# Patient Record
Sex: Female | Born: 1955
Health system: Southern US, Community
[De-identification: ages and names within clinical notes are randomized; demographics above are authoritative.]

## PROBLEM LIST (undated history)

## (undated) DIAGNOSIS — R87619 Unspecified abnormal cytological findings in specimens from cervix uteri: Secondary | ICD-10-CM

## (undated) DIAGNOSIS — M1990 Unspecified osteoarthritis, unspecified site: Secondary | ICD-10-CM

## (undated) DIAGNOSIS — I10 Essential (primary) hypertension: Secondary | ICD-10-CM

## (undated) DIAGNOSIS — R9431 Abnormal electrocardiogram [ECG] [EKG]: Secondary | ICD-10-CM

## (undated) DIAGNOSIS — IMO0002 Reserved for concepts with insufficient information to code with codable children: Secondary | ICD-10-CM

## (undated) DIAGNOSIS — F32A Depression, unspecified: Secondary | ICD-10-CM

## (undated) DIAGNOSIS — F419 Anxiety disorder, unspecified: Secondary | ICD-10-CM

## (undated) DIAGNOSIS — E785 Hyperlipidemia, unspecified: Secondary | ICD-10-CM

## (undated) DIAGNOSIS — K5790 Diverticulosis of intestine, part unspecified, without perforation or abscess without bleeding: Secondary | ICD-10-CM

## (undated) DIAGNOSIS — K219 Gastro-esophageal reflux disease without esophagitis: Secondary | ICD-10-CM

## (undated) DIAGNOSIS — F329 Major depressive disorder, single episode, unspecified: Secondary | ICD-10-CM

## (undated) DIAGNOSIS — Z9889 Other specified postprocedural states: Secondary | ICD-10-CM

## (undated) HISTORY — PX: COLONOSCOPY: SHX174

## (undated) HISTORY — DX: Essential (primary) hypertension: I10

## (undated) HISTORY — DX: Hyperlipidemia, unspecified: E78.5

## (undated) HISTORY — DX: Reserved for concepts with insufficient information to code with codable children: IMO0002

## (undated) HISTORY — DX: Unspecified abnormal cytological findings in specimens from cervix uteri: R87.619

## (undated) HISTORY — DX: Other specified postprocedural states: Z98.890

## (undated) HISTORY — DX: Abnormal electrocardiogram (ECG) (EKG): R94.31

## (undated) HISTORY — PX: OTHER SURGICAL HISTORY: SHX169

## (undated) HISTORY — DX: Diverticulosis of intestine, part unspecified, without perforation or abscess without bleeding: K57.90

## (undated) HISTORY — PX: SPINE SURGERY: SHX786

---
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
## 1998-07-10 ENCOUNTER — Ambulatory Visit (HOSPITAL_COMMUNITY): Admission: RE | Admit: 1998-07-10 | Discharge: 1998-07-10 | Payer: Self-pay | Admitting: Gastroenterology

## 2002-03-08 HISTORY — PX: LAMINECTOMY: SHX219

## 2002-03-10 ENCOUNTER — Encounter: Payer: Self-pay | Admitting: Neurosurgery

## 2002-03-12 ENCOUNTER — Ambulatory Visit (HOSPITAL_COMMUNITY): Admission: RE | Admit: 2002-03-12 | Discharge: 2002-03-13 | Payer: Self-pay | Admitting: Neurosurgery

## 2002-03-12 ENCOUNTER — Encounter: Payer: Self-pay | Admitting: Neurosurgery

## 2003-05-19 ENCOUNTER — Other Ambulatory Visit: Admission: RE | Admit: 2003-05-19 | Discharge: 2003-05-19 | Payer: Self-pay | Admitting: Family Medicine

## 2004-06-28 ENCOUNTER — Encounter: Admission: RE | Admit: 2004-06-28 | Discharge: 2004-06-28 | Payer: Self-pay | Admitting: Family Medicine

## 2004-07-09 ENCOUNTER — Encounter: Admission: RE | Admit: 2004-07-09 | Discharge: 2004-07-09 | Payer: Self-pay | Admitting: Family Medicine

## 2005-11-19 ENCOUNTER — Ambulatory Visit: Payer: Self-pay | Admitting: Family Medicine

## 2006-01-20 ENCOUNTER — Ambulatory Visit: Payer: Self-pay | Admitting: Family Medicine

## 2007-01-27 ENCOUNTER — Ambulatory Visit: Payer: Self-pay | Admitting: Family Medicine

## 2007-01-27 ENCOUNTER — Other Ambulatory Visit: Admission: RE | Admit: 2007-01-27 | Discharge: 2007-01-27 | Payer: Self-pay | Admitting: Family Medicine

## 2007-02-24 ENCOUNTER — Ambulatory Visit: Payer: Self-pay | Admitting: Family Medicine

## 2007-03-06 ENCOUNTER — Ambulatory Visit: Payer: Self-pay | Admitting: Family Medicine

## 2007-07-01 ENCOUNTER — Ambulatory Visit: Payer: Self-pay | Admitting: Family Medicine

## 2007-10-19 ENCOUNTER — Ambulatory Visit: Payer: Self-pay | Admitting: Family Medicine

## 2007-11-04 ENCOUNTER — Ambulatory Visit: Payer: Self-pay | Admitting: Cardiology

## 2007-11-04 ENCOUNTER — Encounter: Admission: RE | Admit: 2007-11-04 | Discharge: 2007-11-04 | Payer: Self-pay | Admitting: Family Medicine

## 2007-11-16 ENCOUNTER — Encounter: Payer: Self-pay | Admitting: Cardiology

## 2007-11-16 ENCOUNTER — Ambulatory Visit: Payer: Self-pay

## 2008-03-02 ENCOUNTER — Ambulatory Visit: Payer: Self-pay | Admitting: Family Medicine

## 2008-03-04 ENCOUNTER — Encounter: Admission: RE | Admit: 2008-03-04 | Discharge: 2008-03-04 | Payer: Self-pay | Admitting: Family Medicine

## 2008-11-17 ENCOUNTER — Other Ambulatory Visit: Admission: RE | Admit: 2008-11-17 | Discharge: 2008-11-17 | Payer: Self-pay | Admitting: Family Medicine

## 2008-11-17 ENCOUNTER — Ambulatory Visit: Payer: Self-pay | Admitting: Family Medicine

## 2008-11-17 LAB — HM PAP SMEAR: HM Pap smear: NEGATIVE

## 2009-10-12 LAB — HM MAMMOGRAPHY: HM Mammogram: NEGATIVE

## 2009-11-02 ENCOUNTER — Ambulatory Visit: Payer: Self-pay | Admitting: Family Medicine

## 2010-01-28 ENCOUNTER — Encounter: Payer: Self-pay | Admitting: Family Medicine

## 2010-05-22 NOTE — Assessment & Plan Note (Signed)
[facility]                            CARDIOLOGY OFFICE NOTE   NAME:Amber Church, Amber Church                   MRN:          [id number]  DATE:11/04/2007                            DOB:          [birth year]    I was asked by Dr. Feliberto Gottron and Dr. Sharlot Gowda to consult on  [facility], a pleasant 55 year old married white female with an  abnormal EKG.   Her EKG in Dr. Jola Babinski office showed normal sinus rhythm, but low  voltage and poor progression across the anterior pericardium.  There was  also a left axis deviation of -34 degrees.   She had no cardiac history.  She denies any exertional chest pain.  She  has had some palpitations, mostly nocturnal.  She has had some symptoms  of mild orthostasis.  She drinks very little water.   She is not diabetic.  She does have a history of hypertension and  hyperlipidemia.   PAST MEDICAL HISTORY:  No known drug allergies.   CURRENT MEDICATIONS:  1. Lisinopril and hydrochlorothiazide 10/12.5 daily.  2. Pravastatin 40 mg a day.  3. Aspirin 81 mg a day.   She does not smoke.  She does drink couple of glasses of wine on the  weekends only.  She does drink couple of cups of coffee a day.  She  enjoys walking at lunch time.   SURGERY:  She has had ruptured disk repair in 2007.   FAMILY HISTORY:  Negative for premature coronary artery disease.   SOCIAL HISTORY:  She is married and has 1 child.  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   REVIEW OF SYSTEMS:  Other than the HPI is positive for fatigue and  urinary tract problems or infections.   PHYSICAL EXAMINATION:  VITAL SIGNS:  Today, she is 5 feet 5 inches and  weighs 206.  Her blood pressure is 100/80 and her pulse is 89 and  regular.  EKG was repeated and is identical to that of Dr. Jola Babinski.  HEENT:  Normocephalic, atraumatic.  PERRLA.  Extraocular movements are  intact.  Sclerae are clear.  Facial symmetry is normal.   Dentition is  satisfactory.  Oral mucosa is normal.  NECK:  Supple.  Carotid upstrokes are equal bilaterally without bruits,  no JVD.  Thyroid is not enlarged.  Trachea is midline.  LUNGS:  Clear to auscultation and percussion.  HEART:  A nondisplaced PMI.  Normal S1 and S2.  No click, murmur, rub,  or gallop.  ABDOMEN:  Soft, good bowel sounds.  No midline or pulsatile mass.  No  hepatomegaly.  EXTREMITIES:  There is no cyanosis, clubbing, or edema.  Pulses are  intact.  NEURO:  Intact.  SKIN:  Unremarkable.   ASSESSMENT:  Abnormal electrocardiogram, which is probably normal.   PLAN:  A 2-D echocardiogram to rule out organic heart disease.  If this  is normal reassurance will be given.  Her tachy palpitations in that  instance would also be deemed benign.     Thomas C. Daleen Squibb, MD, [facility]  Electronically Signed  TCW/MedQ  DD: 11/04/2007  DT: 11/05/2007  Job #: [id number]   cc:   Sharlot Gowda, M.D.  Dr. Feliberto Gottron

## 2010-05-25 NOTE — Op Note (Signed)
   [name], RAINE                        ACCOUNT NO.:  [id number]   MEDICAL RECORD NO.:  [id number]                   PATIENT TYPE:  OIB   LOCATION:  NA                                   FACILITY:  [facility]   PHYSICIAN:  Payton Doughty, M.D.                   DATE OF BIRTH:  08/09/1955   DATE OF PROCEDURE:  03/12/2002  DATE OF DISCHARGE:                                 OPERATIVE REPORT   PREOPERATIVE DIAGNOSES:  Herniated disk at L5-S1 left.   POSTOPERATIVE DIAGNOSIS:  Herniated disk at L5-S1 left.   PROCEDURE:  L5-S1 laminectomy and diskectomy.   SURGEON:  Payton Doughty, M.D.   ASSISTANTS:  Nurse assistant:  Basilia Jumbo.  Doctor assistant:  Stefani Dama, M.D.   ANESTHESIA:  General endotracheal.   PREPARATION:  Sterile Betadine prep and scrub with alcohol wipe.   COMPLICATIONS:  None.   DESCRIPTION OF PROCEDURE:  A 55 year old right-handed white lady with a  herniated disk at L5-S1 on the left.  Taken to the operating room and  smoothly anesthetized and intubated, placed prone on the operating table.  Following shave, prep, and drape in the usual sterile fashion, the skin was  infiltrated with 1% lidocaine and 1:400,000.  The skin was incised from mid-  L5 to mid-S1 and the lamina of L5, exposing the left side in the  subperiosteal plane.  Intraoperative x-ray confirmed correctness of level.  Having confirmed correctness of the level, L5 hemisemilaminectomy was  carried out to the top of the ligamentum flavum that was removed in a  retrograde fashion.  The S1 nerve root was identified and gently dissected  free and retracted medially, exposing a herniated disk underneath it.  The  annular fibers were divided and the disk material removed.  The disk itself  was explored and all marginally clinging fragments were removed.  The  anterior epidural space was carefully explored and no fragments were found.  The wound was irrigated and hemostasis assured.  The laminectomy defect  was  filled with Depo-Medrol-soaked fat.  The fascia was reapproximated with 0  Vicryl in interrupted fashion, the subcutaneous tissue was reapproximated  with 0 Vicryl in interrupted fashion.  The skin was closed with 4-0 Vicryl  in a running subcuticular fashion.  Benzoin and Steri-Strips were placed and  made occlusive with Telfa and OpSite and the patient returned to the  recovery room in good condition.                                               Payton Doughty, M.D.    MWR/MEDQ  D:  03/12/2002  T:  03/13/2002  Job:  [id number]

## 2010-05-25 NOTE — H&P (Signed)
[name], [name]                        ACCOUNT NO.:  [id number]   MEDICAL RECORD NO.:  [id number]                   PATIENT TYPE:  OIB   LOCATION:  3010                                 FACILITY:  [facility]   PHYSICIAN:  Payton Doughty, M.D.                   DATE OF BIRTH:  [birth year]   DATE OF ADMISSION:  03/12/2002  DATE OF DISCHARGE:                                HISTORY & PHYSICAL   ADMITTING DIAGNOSIS:  Herniated disk on the left side at L5-S1.   SERVICE:  Neurosurgery.   HISTORY OF PRESENT ILLNESS:  [age]-year-old right-handed white lady who  in December was picking up a couch and had a funny feeling in her back,  since then has had progressive pain in her back and down her left leg.  She  tried oral steroids and did not help her very much.  MRI demonstrates a  herniated disk at 5-1 and she is admitted for diskectomy.   MEDICAL HISTORY:  Medical history is benign.   MEDICATIONS:  She is on hydrocodone and Flexeril.   ALLERGIES:  She has no allergies.   OPERATION:  Has had no operations.   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   FAMILY HISTORY:  Mom is 33, in fair health; she had a disk operation and hip  replacement.  Her daddy is deceased.  There is a history of diabetes.   REVIEW OF SYSTEMS:  Review of systems is remarkable for glasses, arm pain  and the back pain as noted.   PHYSICAL EXAMINATION:  HEENT:  Exam is within normal limits.  NECK:  She has good range of motion in her neck.  CHEST:  Chest clear.  CARDIAC:  Regular rate and rhythm.  ABDOMEN:  Abdomen is nontender with no hepatosplenomegaly.  EXTREMITIES:  Extremities are without clubbing or cyanosis.  GU:  Exam is deferred.  PERIPHERAL PULSES:  Good.  NEUROLOGIC:  She is awake, alert and oriented.  Cranial nerves are intact.  Motor exam shows 5/5 strength throughout the upper and lower extremities.  There is give-away weakness in  the extensors on the left side related to  pain.  Sensory deficit is described in the left S1 distribution.  Deep  tendon reflexes are 2 at the knees, 1 at the right ankle, absent at the left  and straight leg raise is positive on the left.   ACCESSORY CLINICAL DATA:  She comes accompanied with plain films that show  loss of disk space at 5-1 and MRI shows a herniated disk at 5-1, eccentric  to the left with elevation of the left S1 root.   CLINICAL IMPRESSION:  Herniated disk at L5-S1 with left S1 radiculopathy.   PLAN:  Plan is for a lumbar laminectomy and diskectomy.  The risks and  benefits of this approach have been discussed  with her and she wishes to  proceed.                                               Payton Doughty, M.D.    MWR/MEDQ  D:  03/12/2002  T:  03/13/2002  Job:  [id number]

## 2010-07-04 ENCOUNTER — Other Ambulatory Visit: Payer: Self-pay | Admitting: *Deleted

## 2010-07-04 MED ORDER — PRAVASTATIN SODIUM 40 MG PO TABS: 40.0000 mg | ORAL_TABLET | Freq: Every day | ORAL | Status: DC

## 2010-07-06 ENCOUNTER — Encounter: Payer: Self-pay | Admitting: Family Medicine

## 2010-08-06 ENCOUNTER — Ambulatory Visit (INDEPENDENT_AMBULATORY_CARE_PROVIDER_SITE_OTHER): Payer: 59 | Admitting: Family Medicine

## 2010-08-06 ENCOUNTER — Encounter: Payer: Self-pay | Admitting: Family Medicine

## 2010-08-06 ENCOUNTER — Other Ambulatory Visit: Payer: Self-pay

## 2010-08-06 VITALS — BP 132/90 | HR 72 | Ht 65.0 in | Wt 206.0 lb

## 2010-08-06 DIAGNOSIS — E669 Obesity, unspecified: Secondary | ICD-10-CM

## 2010-08-06 DIAGNOSIS — Z0000 Encounter for general adult medical examination without abnormal findings: Secondary | ICD-10-CM

## 2010-08-06 DIAGNOSIS — Z79899 Other long term (current) drug therapy: Secondary | ICD-10-CM

## 2010-08-06 DIAGNOSIS — E785 Hyperlipidemia, unspecified: Secondary | ICD-10-CM | POA: Insufficient documentation

## 2010-08-06 DIAGNOSIS — I10 Essential (primary) hypertension: Secondary | ICD-10-CM | POA: Insufficient documentation

## 2010-08-06 DIAGNOSIS — Z23 Encounter for immunization: Secondary | ICD-10-CM

## 2010-08-06 LAB — LIPID PANEL
Total CHOL/HDL Ratio: 3.3 Ratio
VLDL: 28 mg/dL (ref 0–40)

## 2010-08-06 LAB — COMPREHENSIVE METABOLIC PANEL
ALT: 13 U/L (ref 0–35)
AST: 19 U/L (ref 0–37)
BUN: 13 mg/dL (ref 6–23)
CO2: 26 mEq/L (ref 19–32)
Calcium: 9.4 mg/dL (ref 8.4–10.5)
Creat: 0.86 mg/dL (ref 0.50–1.10)
Total Bilirubin: 0.5 mg/dL (ref 0.3–1.2)
Total Protein: 7 g/dL (ref 6.0–8.3)

## 2010-08-06 LAB — CBC WITH DIFFERENTIAL/PLATELET
Basophils Absolute: 0 10*3/uL (ref 0.0–0.1)
Eosinophils Absolute: 0.1 10*3/uL (ref 0.0–0.7)
Eosinophils Relative: 2 % (ref 0–5)
Lymphocytes Relative: 34 % (ref 12–46)
Lymphs Abs: 2.3 10*3/uL (ref 0.7–4.0)
MCH: 29.1 pg (ref 26.0–34.0)
MCHC: 32.7 g/dL (ref 30.0–36.0)
MCV: 89 fL (ref 78.0–100.0)
Platelets: 283 10*3/uL (ref 150–400)
RBC: 4.29 MIL/uL (ref 3.87–5.11)

## 2010-08-06 MED ORDER — LISINOPRIL-HYDROCHLOROTHIAZIDE 10-12.5 MG PO TABS: 1.0000 | ORAL_TABLET | Freq: Every day | ORAL | Status: DC

## 2010-08-06 MED ORDER — PRAVASTATIN SODIUM 40 MG PO TABS: 40.0000 mg | ORAL_TABLET | Freq: Every day | ORAL | Status: DC

## 2010-08-06 NOTE — Patient Instructions (Signed)
Make dietary changes especially in regard to cholesterol.

## 2010-08-06 NOTE — Progress Notes (Signed)
  Subjective:    Patient ID: Amber Church, female    DOB: 01-14-1955, 55 y.o.   MRN: [id number]  HPI She is here for medication check. She continues on her blood pressure medication and did rule out several days ago. She continues on her statin drug. Work is going quite well and is somewhat stressful but she is handling this well. Review of her record indicates need for TDaP. She is up-to-date on her mammogram as well as colonoscopy. She has no other concerns or complaints.   Review of Systems Negative except as above    Objective:   Physical Exam alert and in no distress. Tympanic membranes and canals are normal. Throat is clear. Tonsils are normal. Neck is supple without adenopathy or thyromegaly. Cardiac exam shows a regular sinus rhythm without murmurs or gallops. Lungs are clear to auscultation.        Assessment & Plan:  Hypertension. Hyperlipidemia. Obesity. Routine blood screening. Encouraged her to make dietary changes especially in regard to carbohydrates. Her meds were renewed.

## 2010-08-06 NOTE — Progress Notes (Signed)
Addended by: Dorthula Perfect on: 08/06/2010 02:41 PM   Modules accepted: Orders

## 2010-08-07 ENCOUNTER — Telehealth: Payer: Self-pay

## 2010-08-07 NOTE — Telephone Encounter (Signed)
Left message blood work normal  

## 2010-11-05 ENCOUNTER — Encounter: Payer: Self-pay | Admitting: Family Medicine

## 2011-03-20 ENCOUNTER — Encounter: Payer: Self-pay | Admitting: Internal Medicine

## 2011-04-08 ENCOUNTER — Ambulatory Visit (INDEPENDENT_AMBULATORY_CARE_PROVIDER_SITE_OTHER): Payer: 59 | Admitting: Family Medicine

## 2011-04-08 ENCOUNTER — Encounter: Payer: Self-pay | Admitting: Family Medicine

## 2011-04-08 VITALS — BP 130/90 | HR 70 | Wt 200.0 lb

## 2011-04-08 DIAGNOSIS — M67919 Unspecified disorder of synovium and tendon, unspecified shoulder: Secondary | ICD-10-CM

## 2011-04-08 DIAGNOSIS — M719 Bursopathy, unspecified: Secondary | ICD-10-CM

## 2011-04-08 DIAGNOSIS — M7552 Bursitis of left shoulder: Secondary | ICD-10-CM

## 2011-04-08 NOTE — Patient Instructions (Signed)
If you're still having trouble in one month, give me a call

## 2011-04-08 NOTE — Progress Notes (Signed)
  Subjective:    Patient ID: Amber Church, female    DOB: 1955/08/04, 57 y.o.   MRN: [id number]  HPI She is here for consult concerning a 2 week history of left shoulder pain. She gives no history of injury but has been doing actual lifting at work but does not remember any particular incident there. There's been no numbness, tingling or crepitus. Her strength is good.   Review of Systems     Objective:   Physical Exam No tenderness to palpation is noted. Negative drop arm test. Negative sulcus test. Pain on motion in all directions. Hawkins and Neer's test was painful.       Assessment & Plan:   1. Bursitis of left shoulder    I discussed options with her concerning conservative care with an inflammatory versus injection. She has decided to have the injection. 40 mg of Kenalog and 3 cc of Xylocaine was injected into the subacromial bursa without difficulty. She is to let me know how this is working in approximately one month

## 2011-07-15 ENCOUNTER — Encounter: Payer: Self-pay | Admitting: Family Medicine

## 2011-07-15 ENCOUNTER — Ambulatory Visit
Admission: RE | Admit: 2011-07-15 | Discharge: 2011-07-15 | Disposition: A | Discharge status: Home / Self Care | Payer: 59 | Source: Ambulatory Visit | Attending: Family Medicine | Admitting: Family Medicine

## 2011-07-15 ENCOUNTER — Ambulatory Visit (INDEPENDENT_AMBULATORY_CARE_PROVIDER_SITE_OTHER): Payer: 59 | Admitting: Family Medicine

## 2011-07-15 VITALS — BP 130/80 | HR 77 | Wt 183.0 lb

## 2011-07-15 DIAGNOSIS — M25519 Pain in unspecified shoulder: Secondary | ICD-10-CM

## 2011-07-15 DIAGNOSIS — M25512 Pain in left shoulder: Secondary | ICD-10-CM

## 2011-07-15 NOTE — Progress Notes (Signed)
  Subjective:    Patient ID: Amber Church, female    DOB: 10/04/1955, 56 y.o.   MRN: [id number]  HPI She is here for a recheck. She states that the shot gave her only 2 weeks worth of relief of her symptoms. Since then the pain is getting worse. She has no history of injury. No clicking or locking.   Review of Systems     Objective:   Physical Exam Abduction and external rotation was quite limited due to pain. Negative sulcus sign but pain on stressing the shoulder labrum. Drop arm test was uncomfortable.       Assessment & Plan:   1. Left shoulder pain  DG Shoulder Left   we will start with x-rays and probably get an MRI.

## 2011-08-20 ENCOUNTER — Other Ambulatory Visit: Payer: Self-pay | Admitting: Family Medicine

## 2011-12-14 ENCOUNTER — Other Ambulatory Visit: Payer: Self-pay | Admitting: Family Medicine

## 2011-12-23 ENCOUNTER — Encounter: Payer: Self-pay | Admitting: Medical

## 2011-12-23 ENCOUNTER — Ambulatory Visit (INDEPENDENT_AMBULATORY_CARE_PROVIDER_SITE_OTHER): Payer: 59 | Admitting: Medical

## 2011-12-23 VITALS — BP 112/80 | HR 68 | Temp 97.9°F | Resp 16 | Wt 184.0 lb

## 2011-12-23 DIAGNOSIS — R232 Flushing: Secondary | ICD-10-CM

## 2011-12-23 DIAGNOSIS — I10 Essential (primary) hypertension: Secondary | ICD-10-CM

## 2011-12-23 DIAGNOSIS — E785 Hyperlipidemia, unspecified: Secondary | ICD-10-CM

## 2011-12-23 DIAGNOSIS — E669 Obesity, unspecified: Secondary | ICD-10-CM

## 2011-12-23 DIAGNOSIS — N951 Menopausal and female climacteric states: Secondary | ICD-10-CM

## 2011-12-23 LAB — CBC WITH DIFFERENTIAL/PLATELET
Basophils Relative: 0 % (ref 0–1)
Eosinophils Relative: 1 % (ref 0–5)
Hemoglobin: 13.2 g/dL (ref 12.0–15.0)
Lymphocytes Relative: 38 % (ref 12–46)
MCH: 29.4 pg (ref 26.0–34.0)
Monocytes Absolute: 0.5 10*3/uL (ref 0.1–1.0)
Monocytes Relative: 6 % (ref 3–12)
Neutrophils Relative %: 55 % (ref 43–77)
Platelets: 346 10*3/uL (ref 150–400)

## 2011-12-23 MED ORDER — CLONIDINE HCL 0.1 MG PO TABS: 0.1000 mg | ORAL_TABLET | Freq: Every day | ORAL | Status: DC

## 2011-12-23 MED ORDER — PRAVASTATIN SODIUM 40 MG PO TABS: 40.0000 mg | ORAL_TABLET | Freq: Every day | ORAL | Status: DC

## 2011-12-23 MED ORDER — LISINOPRIL-HYDROCHLOROTHIAZIDE 10-12.5 MG PO TABS: 1.0000 | ORAL_TABLET | Freq: Every day | ORAL | Status: DC

## 2011-12-23 NOTE — Progress Notes (Signed)
Subjective: Here for med check.   She has hx/o hypertension, hyperlipidemia, here for recheck on medications, labs, fasting.  Overall doing well, no major c/o.  Was exercising regularly, walking with her sister, but not as much given the colder weather, getting dark out sooner.   She had lost 28lb in the spring [employer].   The only concern she has is hot flashes.  LMP 2 years ago.  She does reports some issues with sleep, at times irritable, hot flashes, worse at night, but no vaginal c/o, no mood changes in general.   Less sexual desire mainly due to night time flushing.  This has caused some concern in the marriage.  Would like to have treatment for hot flashes.  No other new c/o.   She did have mammogram and repeat colonoscopy this year.   Last pap 2012.  Past Medical History  Diagnosis Date  . Hypertension   . Diverticulosis   . S/P colonoscopic polypectomy   . Hyperlipidemia    ROS as in HPI   Objective:   Physical Exam  Filed Vitals:   12/23/11 1519  BP: 112/80  Pulse: 68  Temp: 97.9 F (36.6 C)  Resp: 16    General appearance: alert, no distress, WD/WN Neck: supple, no lymphadenopathy, no thyromegaly, no masses Heart: RRR, normal S1, S2, no murmurs Lungs: CTA bilaterally, no wheezes, rhonchi, or rales Abdomen: +bs, soft, non tender, non distended, no masses, no hepatomegaly, no splenomegaly Pulses: 2+ symmetric, upper and lower extremities, normal cap refill Ext: no edema   Assessment and Plan :    Encounter Diagnoses  Name Primary?  . Essential hypertension, benign Yes  . Hyperlipidemia   . Obesity   . Hot flashes   . Menopausal state    HTN - refilled medication, controlled on current medication.  Labs today  Hyperlipidemia - c/t current medication, fasting labs today  Obesity - advised weight loss, routien exercise and healthy low fat diet  Hot flashes, menopausal - discussed symptoms, associated symptoms, options for therapy including HRT.   Also  discussed risks/benefits of therapy.  Trial of clonidine QHS for hot flashes.   discussed diet and exercise.  Follow-up pending labs.  Needs to return soon for updated pap, physical.

## 2011-12-24 ENCOUNTER — Encounter: Payer: Self-pay | Admitting: Internal Medicine

## 2011-12-24 LAB — LIPID PANEL
Cholesterol: 174 mg/dL (ref 0–200)
LDL Cholesterol: 96 mg/dL (ref 0–99)
Total CHOL/HDL Ratio: 2.9 Ratio
VLDL: 18 mg/dL (ref 0–40)

## 2011-12-24 LAB — COMPREHENSIVE METABOLIC PANEL
AST: 18 U/L (ref 0–37)
BUN: 15 mg/dL (ref 6–23)
Calcium: 9.5 mg/dL (ref 8.4–10.5)
Chloride: 103 mEq/L (ref 96–112)
Creat: 0.84 mg/dL (ref 0.50–1.10)
Glucose, Bld: 82 mg/dL (ref 70–99)
Potassium: 4 mEq/L (ref 3.5–5.3)
Total Protein: 6.6 g/dL (ref 6.0–8.3)

## 2011-12-24 LAB — TSH: TSH: 0.956 u[IU]/mL (ref 0.350–4.500)

## 2012-03-09 ENCOUNTER — Ambulatory Visit (INDEPENDENT_AMBULATORY_CARE_PROVIDER_SITE_OTHER): Payer: 59 | Admitting: Family Medicine

## 2012-03-09 ENCOUNTER — Other Ambulatory Visit (HOSPITAL_COMMUNITY)
Admission: RE | Admit: 2012-03-09 | Discharge: 2012-03-09 | Disposition: A | Discharge status: Home / Self Care | Payer: 59 | Source: Ambulatory Visit | Attending: Family Medicine | Admitting: Family Medicine

## 2012-03-09 ENCOUNTER — Encounter: Payer: Self-pay | Admitting: Family Medicine

## 2012-03-09 VITALS — BP 112/86 | HR 60 | Ht 64.5 in | Wt 184.0 lb

## 2012-03-09 DIAGNOSIS — Z01419 Encounter for gynecological examination (general) (routine) without abnormal findings: Secondary | ICD-10-CM | POA: Insufficient documentation

## 2012-03-09 DIAGNOSIS — Z1151 Encounter for screening for human papillomavirus (HPV): Secondary | ICD-10-CM | POA: Insufficient documentation

## 2012-03-09 DIAGNOSIS — N959 Unspecified menopausal and perimenopausal disorder: Secondary | ICD-10-CM | POA: Insufficient documentation

## 2012-03-09 DIAGNOSIS — R32 Unspecified urinary incontinence: Secondary | ICD-10-CM

## 2012-03-09 DIAGNOSIS — E669 Obesity, unspecified: Secondary | ICD-10-CM

## 2012-03-09 DIAGNOSIS — I10 Essential (primary) hypertension: Secondary | ICD-10-CM

## 2012-03-09 DIAGNOSIS — E785 Hyperlipidemia, unspecified: Secondary | ICD-10-CM

## 2012-03-09 DIAGNOSIS — Z0000 Encounter for general adult medical examination without abnormal findings: Secondary | ICD-10-CM

## 2012-03-09 LAB — POCT URINALYSIS DIPSTICK
Bilirubin, UA: NEGATIVE
Ketones, UA: NEGATIVE
Nitrite, UA: NEGATIVE
pH, UA: 5

## 2012-03-09 NOTE — Patient Instructions (Addendum)
HEALTH MAINTENANCE RECOMMENDATIONS:  It is recommended that you get at least 30 minutes of aerobic exercise at least 5 days/week (for weight loss, you may need as much as 60-90 minutes). This can be any activity that gets your heart rate up. This can be divided in 10-15 minute intervals if needed, but try and build up your endurance at least once a week.  Weight bearing exercise is also recommended twice weekly.  Eat a healthy diet with lots of vegetables, fruits and fiber.  "Colorful" foods have a lot of vitamins (ie green vegetables, tomatoes, red peppers, etc).  Limit sweet tea, regular sodas and alcoholic beverages, all of which has a lot of calories and sugar.  Up to 1 alcoholic drink daily may be beneficial for women (unless trying to lose weight, watch sugars).  Drink a lot of water.  Calcium recommendations are 1200-1500 mg daily (1500 mg for postmenopausal women or women without ovaries), and vitamin D 1000 IU daily.  This should be obtained from diet and/or supplements (vitamins), and calcium should not be taken all at once, but in divided doses.  Monthly self breast exams and yearly mammograms for women over the age of 83 is recommended.  Sunscreen of at least SPF 30 should be used on all sun-exposed parts of the skin when outside between the hours of 10 am and 4 pm (not just when at beach or pool, but even with exercise, golf, tennis, and yard work!)  Use a sunscreen that says "broad spectrum" so it covers both UVA and UVB rays, and make sure to reapply every 1-2 hours.  Remember to change the batteries in your smoke detectors when changing your clock times in the spring and fall.  Use your seat belt every time you are in a car, and please drive safely and not be distracted with cell phones and texting while driving.  Check with your insurance regarding zostavax coverage (shingles vaccine) and schedule nurse visit if covered/desired.  Consider trying Estroven (black cohosh and soy) to  further help with hot flashes

## 2012-03-09 NOTE — Progress Notes (Signed)
Chief Complaint  Patient presents with  . Annual Exam    fasting CPE with pap. UA showed trace leuks and 1+ blood, patient does state that she has been experiencing frequency, urgency and some nightime incontinence since menopause. Did not do exam as she is going to sch with her eye doctor and she did not have her glasses. No major concerns.   Amber Church is a 57 y.o. female who presents for a complete physical.  She has the following concerns:  Postmenopausal symptoms--continues to have some irritability.  +hot flashes and night sweats, worse at night.  Noticed some improvement since starting the clonidine, milder, shorter-lived, not as severe.    Hypertension follow-up:  Blood pressures are not checked elsewhere. Denies dizziness, headaches, chest pain.  Denies side effects of medications.  Denies dizziness since adding clonidine for hot flashes.  Hyperlipidemia follow-up:  Patient is reportedly following a low-fat, low cholesterol diet.  Compliant with medications and denies medication side effects  Immunization History  Administered Date(s) Administered  . DTaP 03/13/1998  . Influenza Split 10/22/2011  . Influenza Whole 11/17/2008, 11/03/2010  . Tdap 08/06/2010   Last Pap smear: 2010 Last mammogram: 11/2011 Last colonoscopy: 03/2011 Last DEXA: 11/2008 Dentist: yearly Ophtho: yearly (didn't bring her glasses today) Exercise: walks with her sister 3 miles, about 4x/week.  Past Medical History  Diagnosis Date  . Hypertension   . Diverticulosis   . S/P colonoscopic polypectomy   . Hyperlipidemia   . Abnormal EKG   . Abnormal pap     s/p cryotherapy    Past Surgical History  Procedure Laterality Date  . Laminectomy  03/2002  . Colonoscopy  03/18/11; 2000    Dr. Madilyn Fireman; diverticulosis in sigmoid colon  . Crysurgery  age 71    for abnormal pap    History   Social History  . Marital Status: Married    Spouse Name: N/A    Number of Children: 1  . Years of  Education: N/A   Occupational History  . quality control [employer]   Social History Main Topics  . Smoking status: Never Smoker   . Smokeless tobacco: Never Used  . Alcohol Use: Yes     Comment: glass of wine 1-2 times per week.  . Drug Use: No  . Sexually Active: Yes -- Female partner(s)     Comment: postmenopausal   Other Topics Concern  . Not on file   Social History Narrative   Lives with husband and daughter Nurse, learning disability), 1 dog, 1 cat    Family History  Problem Relation Age of Onset  . Alzheimer's disease Mother   . Hyperlipidemia Mother   . Other Father     died of gunshot wound  . Osteoporosis Sister   . Heart disease Neg Hx   . Osteoporosis Sister   . ALS Maternal Aunt   . ALS Maternal Uncle   . Diabetes Paternal Aunt   . Cancer Maternal Grandfather     lung cancer  . Cancer Paternal Grandmother     ?type    Current outpatient prescriptions:cloNIDine (CATAPRES) 0.1 MG tablet, Take 1 tablet (0.1 mg total) by mouth at bedtime., Disp: 30 tablet, Rfl: 2;  lisinopril-hydrochlorothiazide (PRINZIDE,ZESTORETIC) 10-12.5 MG per tablet, Take 1 tablet by mouth daily., Disp: 30 tablet, Rfl: 5;  pravastatin (PRAVACHOL) 40 MG tablet, Take 1 tablet (40 mg total) by mouth daily., Disp: 30 tablet, Rfl: 5  No Known Allergies  ROS:  The patient denies  anorexia, fever, headaches,  vision changes, decreased hearing, ear pain, sore throat, breast concerns, chest pain, palpitations, dizziness, syncope, dyspnea on exertion, cough, swelling, nausea, vomiting, diarrhea, constipation, abdominal pain, melena, hematochezia, hematuria, dysuria, vaginal bleeding, discharge, odor, sometimes has external vaginal itch; denies genital lesions, joint pains, numbness, tingling, weakness, tremor, suspicious skin lesions, depression, anxiety, abnormal bleeding/bruising, or enlarged lymph nodes. Occasional heartburn at night, improved with eating earlier +weight loss in the Spring.  Has only  gained a few back, since less active with cold/bad weather +urinary incontinence (urge, mainly at night)  PHYSICAL EXAM: BP 112/86  Pulse 60  Ht 5' 4.5" (1.638 m)  Wt 184 lb (83.462 kg)  BMI 31.11 kg/m2  General Appearance:    Alert, cooperative, no distress, appears stated age  Head:    Normocephalic, without obvious abnormality, atraumatic  Eyes:    PERRL, conjunctiva/corneas clear, EOM's intact, fundi    benign  Ears:    Normal TM's and external ear canals  Nose:   Nares normal, mucosa normal, no drainage or sinus   tenderness  Throat:   Lips, mucosa, and tongue normal; teeth and gums normal  Neck:   Supple, no lymphadenopathy;  thyroid:  no   enlargement/tenderness/nodules; no carotid   bruit or JVD  Back:    Spine nontender, no curvature, ROM normal, no CVA     tenderness  Lungs:     Clear to auscultation bilaterally without wheezes, rales or     ronchi; respirations unlabored  Chest Wall:    No tenderness or deformity   Heart:    Regular rate and rhythm, S1 and S2 normal, no murmur, rub   or gallop  Breast Exam:    No tenderness, masses, or nipple discharge or inversion.      No axillary lymphadenopathy  Abdomen:     Soft, non-tender, nondistended, normoactive bowel sounds,    no masses, no hepatosplenomegaly  Genitalia:    Normal external genitalia without lesions.  BUS and vagina normal; cervix without lesions, or cervical motion tenderness. No abnormal vaginal discharge.  Uterus and adnexa not enlarged, nontender, no masses.  Pap performed  Rectal:    Normal tone, no masses or tenderness; guaiac negative stool  Extremities:   No clubbing, cyanosis or edema  Pulses:   2+ and symmetric all extremities  Skin:   Skin color, texture, turgor normal, no rashes or lesions  Lymph nodes:   Cervical, supraclavicular, and axillary nodes normal  Neurologic:   CNII-XII intact, normal strength, sensation and gait; reflexes 2+ and symmetric throughout          Psych:   Normal mood, affect,  hygiene and grooming.    ASSESSMENT/PLAN:  Routine general medical examination at a health care facility - Plan: POCT Urinalysis Dipstick, Cytology - PAP  Obesity (BMI 30-39.9)  Hypertension  Hyperlipidemia LDL goal <100  Postmenopausal symptoms  Urinary incontinence - Plan: Urine culture  Labs all UTD, done at med check in December.  BP and lipids controlled on current meds.  Postmenopausal symptoms--Try Estroven in addition to low dose clonidine.  Discussed increasing dose, but also discussed side effects.  Hold off on increasing dose for now.  Also briefly discussed effexor--recommended especially if increasing irritability/depression.  Discussed monthly self breast exams and yearly mammograms after the age of 66; at least 30 minutes of aerobic activity at least 5 days/week; proper sunscreen use reviewed; healthy diet, including goals of calcium and vitamin D intake and alcohol recommendations (less than or  equal to 1 drink/day) reviewed; regular seatbelt use; changing batteries in smoke detectors.  Immunization recommendations discussed.  Colonoscopy recommendations reviewed, UTD.  Counseled regarding risks/benefits of shingles vaccine--will schedule NV if insurance covers (may need to wait until age 44)

## 2012-03-11 ENCOUNTER — Encounter: Payer: Self-pay | Admitting: Family Medicine

## 2012-03-16 NOTE — Progress Notes (Signed)
Left message with patient's husband asking her to return my call.

## 2012-08-12 ENCOUNTER — Ambulatory Visit (INDEPENDENT_AMBULATORY_CARE_PROVIDER_SITE_OTHER): Payer: 59 | Admitting: Family Medicine

## 2012-08-12 ENCOUNTER — Encounter: Payer: Self-pay | Admitting: Family Medicine

## 2012-08-12 ENCOUNTER — Ambulatory Visit
Admission: RE | Admit: 2012-08-12 | Discharge: 2012-08-12 | Disposition: A | Discharge status: Home / Self Care | Payer: 59 | Source: Ambulatory Visit | Attending: Family Medicine | Admitting: Family Medicine

## 2012-08-12 VITALS — BP 116/80 | HR 75 | Wt 187.0 lb

## 2012-08-12 DIAGNOSIS — M25562 Pain in left knee: Secondary | ICD-10-CM

## 2012-08-12 DIAGNOSIS — M25569 Pain in unspecified knee: Secondary | ICD-10-CM

## 2012-08-12 NOTE — Progress Notes (Signed)
  Subjective:    Patient ID: Amber Church, female    DOB: May 02, 1955, 57 y.o.   MRN: [id number]  HPI She complains of a one-year history of left knee pain that initially bothered her mainly with full flexion however within the last couple months she has noted pain with walking. No popping, locking, grinding or swelling. She now having difficulty with right knee discomfort. Since been going on for the last month. She remembers running a short distance or walking a dog but experienced pain the next morning. It has been slowly getting better. She has been using Advil 2 pills 3 or 4 times per day with benefit.   Review of Systems     Objective:   Physical Exam Exam of the left knee shows no swelling. Anterior drawer negative. No tenderness over joint line. McMurray's testing did cause some medial discomfort but only slightly. Right knee exam shows no swelling, anterior drawer is negative. McMurray's testing negative. No joint line tenderness.       Assessment & Plan:  Left knee pain - Plan: DG Knee 1-2 Views Left

## 2012-08-12 NOTE — Progress Notes (Signed)
Quick Note:  LEFT WORD FOR WORD MEASSGE ON HOME MACHINE(Let her know that she does have mild degenerative changes and can use Tylenol first and then try either Advil or Aleve for relief. If she has continued difficulty have her call for an appointment) ______

## 2012-08-17 ENCOUNTER — Telehealth: Payer: Self-pay | Admitting: Internal Medicine

## 2012-08-17 NOTE — Telephone Encounter (Signed)
Pt states that she has arthritis in knee and her sister does too and her sisters been taking celebrex and so she tried it and she took celebrex for 3 days. One tablet in morning and 2 tynelol in afternoon and she says it has helped a lot. She was wondering if you would call in a rx for celebrex to [facility] in [place]

## 2012-08-17 NOTE — Telephone Encounter (Signed)
Called left word for word message

## 2012-08-17 NOTE — Telephone Encounter (Signed)
Needs to try Advil or Aleve first mainly for insurance purposes

## 2012-08-31 ENCOUNTER — Telehealth: Payer: Self-pay | Admitting: Family Medicine

## 2012-08-31 NOTE — Telephone Encounter (Signed)
Pt called and states Aleve and Tylenol not working for the arthritis.  Can she have Celebre

## 2012-08-31 NOTE — Telephone Encounter (Signed)
Pt called and Aleve and Tylenol not working for Arthritis.  Can she have Celebrex for the AM and use Tylenol in the pm.  Please send to [facility] in [place].

## 2012-08-31 NOTE — Telephone Encounter (Signed)
OK for Celebrex.

## 2012-09-01 ENCOUNTER — Other Ambulatory Visit: Payer: Self-pay | Admitting: Medical

## 2012-09-01 MED ORDER — CELECOXIB 200 MG PO CAPS: 200.0000 mg | ORAL_CAPSULE | Freq: Two times a day (BID) | ORAL | Status: DC

## 2012-09-01 NOTE — Telephone Encounter (Signed)
i was going to fill it but not sure of the mg.

## 2012-09-02 NOTE — Telephone Encounter (Signed)
200 mg dosing

## 2012-09-02 NOTE — Telephone Encounter (Signed)
Pt's daughter notified.

## 2012-09-04 ENCOUNTER — Telehealth: Payer: Self-pay | Admitting: Medical

## 2012-09-09 ENCOUNTER — Telehealth: Payer: Self-pay | Admitting: Medical

## 2012-09-09 NOTE — Telephone Encounter (Signed)
LM

## 2012-11-09 ENCOUNTER — Encounter: Payer: Self-pay | Admitting: Internal Medicine

## 2012-11-10 ENCOUNTER — Telehealth: Payer: Self-pay | Admitting: Medical

## 2012-11-10 NOTE — Telephone Encounter (Signed)
error 

## 2012-12-28 ENCOUNTER — Telehealth: Payer: Self-pay | Admitting: Internal Medicine

## 2012-12-28 MED ORDER — CELECOXIB 200 MG PO CAPS: 200.0000 mg | ORAL_CAPSULE | Freq: Two times a day (BID) | ORAL | Status: DC

## 2012-12-28 NOTE — Telephone Encounter (Signed)
Pt would like refill on celebrex 200mg  for her arthitis for knee send to [facility]

## 2013-01-07 ENCOUNTER — Other Ambulatory Visit: Payer: Self-pay | Admitting: Medical

## 2013-04-01 ENCOUNTER — Other Ambulatory Visit: Payer: Self-pay | Admitting: Medical

## 2013-06-07 ENCOUNTER — Ambulatory Visit (INDEPENDENT_AMBULATORY_CARE_PROVIDER_SITE_OTHER): Payer: 59 | Admitting: Family Medicine

## 2013-06-07 ENCOUNTER — Encounter: Payer: Self-pay | Admitting: Family Medicine

## 2013-06-07 VITALS — BP 110/72 | HR 56 | Ht 64.0 in | Wt 184.0 lb

## 2013-06-07 DIAGNOSIS — M1990 Unspecified osteoarthritis, unspecified site: Secondary | ICD-10-CM

## 2013-06-07 DIAGNOSIS — I10 Essential (primary) hypertension: Secondary | ICD-10-CM

## 2013-06-07 DIAGNOSIS — E785 Hyperlipidemia, unspecified: Secondary | ICD-10-CM

## 2013-06-07 DIAGNOSIS — M129 Arthropathy, unspecified: Secondary | ICD-10-CM

## 2013-06-07 DIAGNOSIS — H6120 Impacted cerumen, unspecified ear: Secondary | ICD-10-CM

## 2013-06-07 DIAGNOSIS — Z0000 Encounter for general adult medical examination without abnormal findings: Secondary | ICD-10-CM

## 2013-06-07 DIAGNOSIS — E669 Obesity, unspecified: Secondary | ICD-10-CM

## 2013-06-07 LAB — CBC WITH DIFFERENTIAL/PLATELET
BASOS ABS: 0 10*3/uL (ref 0.0–0.1)
BASOS PCT: 0 % (ref 0–1)
Eosinophils Absolute: 0.1 10*3/uL (ref 0.0–0.7)
Eosinophils Relative: 1 % (ref 0–5)
HCT: 41 % (ref 36.0–46.0)
Hemoglobin: 14.1 g/dL (ref 12.0–15.0)
LYMPHS PCT: 30 % (ref 12–46)
Lymphs Abs: 2.3 10*3/uL (ref 0.7–4.0)
MCH: 29.6 pg (ref 26.0–34.0)
MCHC: 34.4 g/dL (ref 30.0–36.0)
MCV: 86.1 fL (ref 78.0–100.0)
Monocytes Absolute: 0.5 10*3/uL (ref 0.1–1.0)
Monocytes Relative: 7 % (ref 3–12)
NEUTROS ABS: 4.8 10*3/uL (ref 1.7–7.7)
NEUTROS PCT: 62 % (ref 43–77)
Platelets: 342 10*3/uL (ref 150–400)
RBC: 4.76 MIL/uL (ref 3.87–5.11)
RDW: 13.9 % (ref 11.5–15.5)
WBC: 7.7 10*3/uL (ref 4.0–10.5)

## 2013-06-07 LAB — POCT URINALYSIS DIPSTICK
BILIRUBIN UA: NEGATIVE
Blood, UA: NEGATIVE
Glucose, UA: NEGATIVE
Ketones, UA: NEGATIVE
Leukocytes, UA: NEGATIVE
NITRITE UA: NEGATIVE
PH UA: 7
PROTEIN UA: NEGATIVE
Spec Grav, UA: <=1.005
Urobilinogen, UA: NEGATIVE

## 2013-06-07 LAB — COMPREHENSIVE METABOLIC PANEL
ALBUMIN: 4.3 g/dL (ref 3.5–5.2)
ALK PHOS: 70 U/L (ref 39–117)
ALT: 12 U/L (ref 0–35)
AST: 19 U/L (ref 0–37)
BUN: 12 mg/dL (ref 6–23)
CHLORIDE: 102 meq/L (ref 96–112)
CO2: 29 mEq/L (ref 19–32)
Calcium: 9.7 mg/dL (ref 8.4–10.5)
Creat: 0.8 mg/dL (ref 0.50–1.10)
Glucose, Bld: 86 mg/dL (ref 70–99)
POTASSIUM: 4.4 meq/L (ref 3.5–5.3)
Sodium: 140 mEq/L (ref 135–145)
TOTAL PROTEIN: 7.3 g/dL (ref 6.0–8.3)
Total Bilirubin: 0.6 mg/dL (ref 0.2–1.2)

## 2013-06-07 LAB — LIPID PANEL
Cholesterol: 182 mg/dL (ref 0–200)
HDL: 62 mg/dL (ref >39)
LDL CALC: 100 mg/dL — AB (ref 0–99)
Total CHOL/HDL Ratio: 2.9 Ratio
Triglycerides: 101 mg/dL (ref <150)
VLDL: 20 mg/dL (ref 0–40)

## 2013-06-07 MED ORDER — PRAVASTATIN SODIUM 40 MG PO TABS: ORAL_TABLET | ORAL | Status: DC

## 2013-06-07 MED ORDER — CELECOXIB 200 MG PO CAPS: 200.0000 mg | ORAL_CAPSULE | Freq: Two times a day (BID) | ORAL | Status: DC

## 2013-06-07 MED ORDER — LISINOPRIL-HYDROCHLOROTHIAZIDE 10-12.5 MG PO TABS: ORAL_TABLET | ORAL | Status: DC

## 2013-06-07 NOTE — Progress Notes (Signed)
Subjective:    Patient ID: Amber Church, female    DOB: Mar 03, 1955, 58 y.o.   MRN: [id number]  HPI He is here for a complete examination. She continues on her blood pressure medication and is having no difficulty with this. She also takes Pravachol. Her weight is down slightly. She has been involved with a regular exercise program. She does have bilateral knee pain. OTC meds have not been helpful and she is doing quite well on Celebrex. She was given Catapres to help with her menopausal symptoms but is not taking the stating she is doing quite well. She is up-to-date on her mammogram, Pap and colonoscopy. She has no other concerns or complaints. Social and family history were reviewed. Her work and marriage are going well.  Review of Systems  All other systems reviewed and are negative.      Objective:   Physical Exam BP 110/72  Pulse 56  Ht 5\' 4"  (1.626 m)  Wt 184 lb (83.462 kg)  BMI 31.57 kg/m2  General Appearance:    Alert, cooperative, no distress, appears stated age  Head:    Normocephalic, without obvious abnormality, atraumatic  Eyes:    PERRL, conjunctiva/corneas clear, EOM's intact, fundi    benign  Ears:    Normal TM's and external ear canals after cerumen was removed from both canals. Cerumen was partially mechanically removed   Nose:   Nares normal, mucosa normal, no drainage or sinus   tenderness  Throat:   Lips, mucosa, and tongue normal; teeth and gums normal  Neck:   Supple, no lymphadenopathy;  thyroid:  no   enlargement/tenderness/nodules; no carotid   bruit or JVD  Back:    Spine nontender, no curvature, ROM normal, no CVA     tenderness  Lungs:     Clear to auscultation bilaterally without wheezes, rales or     ronchi; respirations unlabored  Chest Wall:    No tenderness or deformity   Heart:    Regular rate and rhythm, S1 and S2 normal, no murmur, rub   or gallop  Breast Exam:    No tenderness, masses, or nipple discharge or inversion.      No axillary  lymphadenopathy  Abdomen:     Soft, non-tender, nondistended, normoactive bowel sounds,    no masses, no hepatosplenomegaly  Genitalia:    Normal external genitalia without lesions.  BUS and vagina normal; cervix without lesions, or cervical motion tenderness. No abnormal vaginal discharge.  Uterus and adnexa not enlarged, nontender, no masses.  Pap performed  Rectal:    Normal tone, no masses or tenderness; guaiac negative stool  Extremities:   No clubbing, cyanosis or edema  Pulses:   2+ and symmetric all extremities  Skin:   Skin color, texture, turgor normal, no rashes or lesions  Lymph nodes:   Cervical, supraclavicular, and axillary nodes normal  Neurologic:   CNII-XII intact, normal strength, sensation and gait; reflexes 2+ and symmetric throughout          Psych:   Normal mood, affect, hygiene and grooming.         Assessment & Plan:  Routine general medical examination at a health care facility - Plan: Urinalysis Dipstick, CBC with Differential, Comprehensive metabolic panel, Lipid panel  Hypertension - Plan: lisinopril-hydrochlorothiazide (PRINZIDE,ZESTORETIC) 10-12.5 MG per tablet, CBC with Differential, Comprehensive metabolic panel  Hyperlipidemia LDL goal <100 - Plan: pravastatin (PRAVACHOL) 40 MG tablet, Lipid panel  Obesity (BMI 30-39.9) - Plan: CBC with Differential, Comprehensive  metabolic panel, Lipid panel  Arthritis - Plan: celecoxib (CELEBREX) 200 MG capsule  Cerumen impaction

## 2013-06-07 NOTE — Patient Instructions (Signed)
Keep up with a walking and look at making changes in your diet in regard to carbohydrates( white foods). Cut white foods in half

## 2013-11-01 LAB — HM MAMMOGRAPHY

## 2013-11-08 ENCOUNTER — Encounter: Payer: Self-pay | Admitting: Family Medicine

## 2013-11-09 ENCOUNTER — Encounter: Payer: Self-pay | Admitting: Family Medicine

## 2013-11-10 ENCOUNTER — Encounter: Payer: Self-pay | Admitting: Internal Medicine

## 2014-01-31 ENCOUNTER — Telehealth: Payer: Self-pay | Admitting: Family Medicine

## 2014-01-31 NOTE — Telephone Encounter (Signed)
Intiated P.A. Celecoxib.

## 2014-02-01 NOTE — Telephone Encounter (Signed)
P.A. Approved til 01/31/15, pt & pharmacy informed

## 2014-02-24 ENCOUNTER — Encounter: Payer: Self-pay | Admitting: Family Medicine

## 2014-02-24 ENCOUNTER — Ambulatory Visit (INDEPENDENT_AMBULATORY_CARE_PROVIDER_SITE_OTHER): Payer: 59 | Admitting: Family Medicine

## 2014-02-24 VITALS — BP 110/60 | HR 64 | Temp 98.6°F | Ht 64.5 in | Wt 198.0 lb

## 2014-02-24 DIAGNOSIS — N898 Other specified noninflammatory disorders of vagina: Secondary | ICD-10-CM

## 2014-02-24 DIAGNOSIS — D229 Melanocytic nevi, unspecified: Secondary | ICD-10-CM

## 2014-02-24 DIAGNOSIS — Z23 Encounter for immunization: Secondary | ICD-10-CM

## 2014-02-24 DIAGNOSIS — L03319 Cellulitis of trunk, unspecified: Secondary | ICD-10-CM

## 2014-02-24 DIAGNOSIS — L02219 Cutaneous abscess of trunk, unspecified: Secondary | ICD-10-CM

## 2014-02-24 LAB — POCT URINALYSIS DIPSTICK
BILIRUBIN UA: NEGATIVE
GLUCOSE UA: NEGATIVE
KETONES UA: NEGATIVE
LEUKOCYTES UA: NEGATIVE
Nitrite, UA: NEGATIVE
Protein, UA: NEGATIVE
Spec Grav, UA: 1.025
Urobilinogen, UA: NEGATIVE
pH, UA: 6

## 2014-02-24 MED ORDER — DOXYCYCLINE HYCLATE 100 MG PO TABS: 100.0000 mg | ORAL_TABLET | Freq: Two times a day (BID) | ORAL | Status: DC

## 2014-02-24 NOTE — Patient Instructions (Signed)
  Boil in suprapubic area. Apply warm compresses 2-3 times/day Take the antibiotics as directed Use Vagisil (for irritation, not the one for yeast infection), or use a 1/2-1% hydrocortisone cream twice daily, only if needed for itching.  Monitor the mole on your arm--take a picture.  We measured it to be 5 x 77mm in size. If you notice any significant change prior to your next routine appointment, return sooner for re-evaluation and possible biopsy. The appearance to me looks benign.  The rate of increase in size would be a concern (if it continues to grow).  It should remain the same color, shape. Return if bleeding, itching, change in shape, color, size.

## 2014-02-24 NOTE — Progress Notes (Signed)
Chief Complaint  Patient presents with  . knot    on her lower abdominal area-about the size of a quarter and has been there for about a week, not painful but uncomfortable. Also noticed some blood in her urine yesterday, no burning with urination but does mention some external itching for for several months.   . Nevus    has mole on her left arm that has grown and gotten bigger over the last year. If you have time would like you to look at.    She noticed some blood on her underwear yesterday, and when wiping after a void, she noticed it on the toilet paper.  Hasn't noticed any blood today.  Denies dysuria, urgency or any change in urinary frequency.  She is having some itching for several months in the pubic area, but recently spreading over entire vaginal area. Denies any vaginal discharge. Denies new detergents, pantiliners.  Has tried various fabric softeners, but doesn't think they ever bother her. No recent antibiotics.  She has tried Monistat to the affected area, Vagisil.  She may have gotten some temporary relief, but didn't resolve.  Mole on her left upper arm.  It grew in the first 3 months that she first noticed it.  Hasn't changed size since then.  Color has always been very dark.  Not bleeding, itching or painful.  PMH, PSH, SH reviewed and updated Outpatient Encounter Prescriptions as of 02/24/2014  Medication Sig  . celecoxib (CELEBREX) 200 MG capsule Take 1 capsule (200 mg total) by mouth 2 (two) times daily.  Marland Kitchen lisinopril-hydrochlorothiazide (PRINZIDE,ZESTORETIC) 10-12.5 MG per tablet TAKE ONE TABLET BY MOUTH ONCE DAILY  . pravastatin (PRAVACHOL) 40 MG tablet TAKE ONE TABLET BY MOUTH ONCE DAILY  . doxycycline (VIBRA-TABS) 100 MG tablet Take 1 tablet (100 mg total) by mouth 2 (two) times daily.  (doxy started today, not prior to visit)  No Known Allergies  ROS:  No fevers, chills, headaches, dizziness, chest pain, URI symptoms, urinary complaints, vaginal discharge, chest  pain, edema, nausea, vomiting, diarrhea.  See HPI  PHYSICAL EXAM: BP 110/60 mmHg  Pulse 64  Temp(Src) 98.6 F (37 C) (Tympanic)  Ht 5' 4.5" (1.638 m)  Wt 198 lb (89.812 kg)  BMI 33.47 kg/m2 Well developed, pleasant female in good spirits Abdomen: in the lower suprapubic area there is an erythematous raised, tender area with surrounding induration, measuring 1.5-2cm in size.  Very mild surrounding erythema.  No active drainage or fluctuance.  Abdomen is otherwise nontender, no masses The labia appear slightly thickened, but no erythema, no fissures or rash.  Skin is otherwise normal. No abnormal vaginal discharge noted Back: no CVA tenderness Skin: left upper arm: 5x30mm dark, raised and slightly rough feeling oval/round, well-demarcated nevus on the left upper arm.  Urine dip 1+ blood, otherwise normal.  ASSESSMENT/PLAN:  Cellulitis and abscess of trunk - Plan: doxycycline (VIBRA-TABS) 100 MG tablet  Need for prophylactic vaccination and inoculation against influenza - Plan: Flu Vaccine QUAD 36+ mos PF IM (Fluarix Quad PF), POCT Urinalysis Dipstick  Vaginal irritation  Benign pigmented nevus   Boil in suprapubic area. Apply warm compresses 2-3 times/day Take the antibiotics as directed Use Vagisil (for irritation, not the one for yeast infection), or use a 1/2-1% hydrocortisone cream twice daily, only if needed for itching.  Monitor the mole on your arm--take a picture.  We measured it to be 5 x 37mm in size. If you notice any significant change prior to your next routine appointment,  return sooner for re-evaluation and possible biopsy. The appearance to me looks benign.  The rate of increase in size would be a concern (if it continues to grow).  It should remain the same color, shape. Return if bleeding, itching, change in shape, color, size.

## 2014-06-14 ENCOUNTER — Other Ambulatory Visit: Payer: Self-pay | Admitting: Family Medicine

## 2014-06-15 NOTE — Telephone Encounter (Signed)
She needs an appointment. Letter run out of her medicine.

## 2014-06-15 NOTE — Telephone Encounter (Signed)
Is it okay to refill the Celebrex?

## 2014-06-17 ENCOUNTER — Encounter: Payer: Self-pay | Admitting: Family Medicine

## 2014-06-17 ENCOUNTER — Ambulatory Visit (INDEPENDENT_AMBULATORY_CARE_PROVIDER_SITE_OTHER): Payer: 59 | Admitting: Family Medicine

## 2014-06-17 VITALS — BP 120/78 | HR 89 | Wt 198.0 lb

## 2014-06-17 DIAGNOSIS — E785 Hyperlipidemia, unspecified: Secondary | ICD-10-CM | POA: Diagnosis not present

## 2014-06-17 DIAGNOSIS — I10 Essential (primary) hypertension: Secondary | ICD-10-CM

## 2014-06-17 DIAGNOSIS — M1990 Unspecified osteoarthritis, unspecified site: Secondary | ICD-10-CM | POA: Diagnosis not present

## 2014-06-17 DIAGNOSIS — H6123 Impacted cerumen, bilateral: Secondary | ICD-10-CM

## 2014-06-17 DIAGNOSIS — E669 Obesity, unspecified: Secondary | ICD-10-CM | POA: Diagnosis not present

## 2014-06-17 LAB — CBC WITH DIFFERENTIAL/PLATELET
Basophils Absolute: 0 10*3/uL (ref 0.0–0.1)
Basophils Relative: 0 % (ref 0–1)
EOS PCT: 1 % (ref 0–5)
Eosinophils Absolute: 0.1 10*3/uL (ref 0.0–0.7)
HCT: 39.6 % (ref 36.0–46.0)
Hemoglobin: 13.5 g/dL (ref 12.0–15.0)
LYMPHS ABS: 2.8 10*3/uL (ref 0.7–4.0)
LYMPHS PCT: 34 % (ref 12–46)
MCH: 29.3 pg (ref 26.0–34.0)
MCHC: 34.1 g/dL (ref 30.0–36.0)
MCV: 86.1 fL (ref 78.0–100.0)
MPV: 10.1 fL (ref 8.6–12.4)
Monocytes Absolute: 0.6 10*3/uL (ref 0.1–1.0)
Monocytes Relative: 7 % (ref 3–12)
Neutro Abs: 4.8 10*3/uL (ref 1.7–7.7)
Neutrophils Relative %: 58 % (ref 43–77)
PLATELETS: 325 10*3/uL (ref 150–400)
RBC: 4.6 MIL/uL (ref 3.87–5.11)
RDW: 13.7 % (ref 11.5–15.5)
WBC: 8.3 10*3/uL (ref 4.0–10.5)

## 2014-06-17 MED ORDER — CELECOXIB 200 MG PO CAPS: 200.0000 mg | ORAL_CAPSULE | Freq: Two times a day (BID) | ORAL | Status: DC

## 2014-06-17 MED ORDER — PRAVASTATIN SODIUM 40 MG PO TABS: 40.0000 mg | ORAL_TABLET | Freq: Every day | ORAL | Status: DC

## 2014-06-17 MED ORDER — LISINOPRIL-HYDROCHLOROTHIAZIDE 10-12.5 MG PO TABS: 1.0000 | ORAL_TABLET | Freq: Every day | ORAL | Status: DC

## 2014-06-17 NOTE — Patient Instructions (Signed)
150. minutes week of something physical. Cut back on 'white food'

## 2014-06-17 NOTE — Progress Notes (Signed)
   Subjective:    Patient ID: Amber Church, female    DOB: 20-Jul-1955, 59 y.o.   MRN: [id number]  HPI She is here for an interval evaluation. She continues to use Celebrex on an as-needed basis for arthritis mainly having difficulty with knee pain. She has started an exercise regimen which does change with the seasons. She continues on Pravachol and is having no difficulty with that or with Prinzide. She has had some difficulty with cerumen in the past and has not irrigated her ears recently. She's had no chest pain, shortness of breath, GI issuesHer immunizations are up-to-date and health maintenance is also been reviewed. Work and home life are going well. Family and social history were reviewed.   Review of Systems  All other systems reviewed and are negative.      Objective:   Physical Exam Alert and in no distress. Tympanic membranes and canals are normal After cerumen was removed without difficulty.. Pharyngeal area is normal. Neck is supple without adenopathy or thyromegaly. Cardiac exam shows a regular sinus rhythm without murmurs or gallops. Lungs are clear to auscultation.        Assessment & Plan:  Arthritis - Plan: celecoxib (CELEBREX) 200 MG capsule  Obesity (BMI 30-39.9) - Plan: CBC with Differential/Platelet, Comprehensive metabolic panel, Lipid panel  Hyperlipidemia LDL goal <100 - Plan: pravastatin (PRAVACHOL) 40 MG tablet, Lipid panel  Essential hypertension - Plan: lisinopril-hydrochlorothiazide (PRINZIDE,ZESTORETIC) 10-12.5 MG per tablet, CBC with Differential/Platelet, Comprehensive metabolic panel  Cerumen impaction, bilateral I discussed diet and exercise with her in regard to 150 minutes a week of something physical as well as cutting back on carbohydrates. Continue on present medications.

## 2014-06-18 LAB — LIPID PANEL
CHOL/HDL RATIO: 3.4 ratio
Cholesterol: 165 mg/dL (ref 0–200)
HDL: 49 mg/dL (ref >=46)
LDL CALC: 75 mg/dL (ref 0–99)
TRIGLYCERIDES: 205 mg/dL — AB (ref <150)
VLDL: 41 mg/dL — AB (ref 0–40)

## 2014-06-18 LAB — COMPREHENSIVE METABOLIC PANEL
ALT: 11 U/L (ref 0–35)
AST: 18 U/L (ref 0–37)
Albumin: 4.2 g/dL (ref 3.5–5.2)
Alkaline Phosphatase: 76 U/L (ref 39–117)
BUN: 15 mg/dL (ref 6–23)
CO2: 24 mEq/L (ref 19–32)
CREATININE: 0.96 mg/dL (ref 0.50–1.10)
Calcium: 9.7 mg/dL (ref 8.4–10.5)
Chloride: 101 mEq/L (ref 96–112)
Glucose, Bld: 93 mg/dL (ref 70–99)
Potassium: 3.9 mEq/L (ref 3.5–5.3)
SODIUM: 142 meq/L (ref 135–145)
TOTAL PROTEIN: 7 g/dL (ref 6.0–8.3)
Total Bilirubin: 0.5 mg/dL (ref 0.2–1.2)

## 2014-12-09 ENCOUNTER — Encounter: Payer: Self-pay | Admitting: Medical

## 2014-12-09 ENCOUNTER — Ambulatory Visit (INDEPENDENT_AMBULATORY_CARE_PROVIDER_SITE_OTHER): Payer: 59 | Admitting: Medical

## 2014-12-09 VITALS — BP 108/70 | HR 66 | Temp 97.9°F | Wt 204.0 lb

## 2014-12-09 DIAGNOSIS — M1711 Unilateral primary osteoarthritis, right knee: Secondary | ICD-10-CM | POA: Diagnosis not present

## 2014-12-09 MED ORDER — TRAMADOL HCL 50 MG PO TABS: 50.0000 mg | ORAL_TABLET | Freq: Three times a day (TID) | ORAL | Status: DC | PRN

## 2014-12-09 NOTE — Progress Notes (Signed)
Subjective: Chief Complaint  Patient presents with  . knee pain    been on celebrex for over a year. the rt knee is flaring at night time. said that she can not get comfortable to go to sleep. said that laying on her stomach helps some but not much. said she has been taking one extra of the celebrex a day for the last week. let her know we dont recommend canging her dosage without discussing with her provider.    Here for c/o knee pain bilat.  Has known OA of both knees.  Last xray has been 2014.   Been on Celebrex for about a year and working ok, but in the last 1.5 weeks having right knee pain a little worse than usual.  Not so much of a problem during the day but worse at night.   Denies any recent injury, fall, trauma.   She notes that it may have had some swelling.   It does get puffy from time to time.   Denies redness, fever.   Occasionally gets right hip pain, but no recent hip or ankle pain.  Left knee is same as usual.  She mainly wants something to provide relief at bedtime.  No other aggravating or relieving factors. No other complaint.  Past Medical History  Diagnosis Date  . Hypertension   . Diverticulosis   . S/P colonoscopic polypectomy   . Hyperlipidemia   . Abnormal EKG   . Abnormal pap     s/p cryotherapy   ROS as in subjective  Objective: BP 108/70 mmHg  Pulse 66  Temp(Src) 97.9 F (36.6 C) (Oral)  Wt 204 lb (92.534 kg)  Gen: wd, wn, nad Skin: unremarkable, no warmth or erythema.  There are some spider veins patches of left medial leg below knee and right posterolateral knee MSK: bony arthritis changes of patella, mild crepitus noted, no laxity, there is tenderness right medial joint line which has slight puffiness.  Otherwise non tender, normal ROM, no deformity.  Hip and ankle non tender with normal ROM No leg swelling otherwise Leg pulses 1+ Normal strength and sensation of legs    Assessment: Encounter Diagnosis  Name Primary?  . Primary osteoarthritis  of right knee Yes    Plan: No obvious traumatic or precipitating event other than maybe more intensity with exercise/walking of late.  C/t Celebrex.   Can use Ultram QHS the next several days for relief.  discussed possibly using topical therapy or Tylenol OTC along with Celebrex if she continues to have problems.   If not seeing relief with ultram, then call/return as we may want to update xray.

## 2014-12-30 ENCOUNTER — Telehealth: Payer: Self-pay | Admitting: Family Medicine

## 2014-12-30 DIAGNOSIS — E785 Hyperlipidemia, unspecified: Secondary | ICD-10-CM

## 2014-12-30 DIAGNOSIS — M1990 Unspecified osteoarthritis, unspecified site: Secondary | ICD-10-CM

## 2014-12-30 DIAGNOSIS — I10 Essential (primary) hypertension: Secondary | ICD-10-CM

## 2014-12-30 MED ORDER — LISINOPRIL-HYDROCHLOROTHIAZIDE 10-12.5 MG PO TABS: 1.0000 | ORAL_TABLET | Freq: Every day | ORAL | Status: DC

## 2014-12-30 MED ORDER — CELECOXIB 200 MG PO CAPS: 200.0000 mg | ORAL_CAPSULE | Freq: Two times a day (BID) | ORAL | Status: DC

## 2014-12-30 MED ORDER — PRAVASTATIN SODIUM 40 MG PO TABS: 40.0000 mg | ORAL_TABLET | Freq: Every day | ORAL | Status: DC

## 2014-12-30 NOTE — Telephone Encounter (Signed)
Recvd refill 90day refill requests forCelecoxib 200mg , Lisinopril 10-12.5mg , Pravastatin 40mg 

## 2015-01-10 ENCOUNTER — Telehealth: Payer: Self-pay | Admitting: Family Medicine

## 2015-01-10 NOTE — Telephone Encounter (Signed)
Pt called and stated that her mail order pharmacy is changing. Her new mail order pharmacy is [facility]. I have changed on her snap shot. She currently does not need refills.

## 2015-01-30 ENCOUNTER — Telehealth: Payer: Self-pay

## 2015-01-30 DIAGNOSIS — M1990 Unspecified osteoarthritis, unspecified site: Secondary | ICD-10-CM

## 2015-01-30 DIAGNOSIS — E785 Hyperlipidemia, unspecified: Secondary | ICD-10-CM

## 2015-01-30 DIAGNOSIS — I10 Essential (primary) hypertension: Secondary | ICD-10-CM

## 2015-01-30 MED ORDER — PRAVASTATIN SODIUM 40 MG PO TABS: 40.0000 mg | ORAL_TABLET | Freq: Every day | ORAL | Status: DC

## 2015-01-30 MED ORDER — LISINOPRIL-HYDROCHLOROTHIAZIDE 10-12.5 MG PO TABS: 1.0000 | ORAL_TABLET | Freq: Every day | ORAL | Status: DC

## 2015-01-30 MED ORDER — CELECOXIB 200 MG PO CAPS: 200.0000 mg | ORAL_CAPSULE | Freq: Two times a day (BID) | ORAL | Status: DC

## 2015-01-30 NOTE — Telephone Encounter (Signed)
Pt called saying she is switching to [facility] (in chart) and she needs refills on her Celebrex 200mg , Lisinopril-hctz 10-12.5mg , and Pravastatin 40mg . All for 90 day supply

## 2015-06-26 ENCOUNTER — Other Ambulatory Visit: Payer: Self-pay | Admitting: Family Medicine

## 2015-06-26 NOTE — Telephone Encounter (Signed)
Is this okay to refill? 

## 2015-10-19 ENCOUNTER — Encounter: Payer: Self-pay | Admitting: Family Medicine

## 2015-10-19 LAB — HM MAMMOGRAPHY

## 2015-11-06 LAB — HM MAMMOGRAPHY

## 2015-11-10 ENCOUNTER — Encounter: Payer: Self-pay | Admitting: Family Medicine

## 2015-11-10 ENCOUNTER — Ambulatory Visit (INDEPENDENT_AMBULATORY_CARE_PROVIDER_SITE_OTHER): Payer: 59 | Admitting: Family Medicine

## 2015-11-10 VITALS — BP 120/70 | HR 88 | Ht 65.0 in | Wt 200.0 lb

## 2015-11-10 DIAGNOSIS — M7661 Achilles tendinitis, right leg: Secondary | ICD-10-CM

## 2015-11-10 DIAGNOSIS — M7662 Achilles tendinitis, left leg: Secondary | ICD-10-CM

## 2015-11-10 DIAGNOSIS — R1030 Lower abdominal pain, unspecified: Secondary | ICD-10-CM

## 2015-11-10 NOTE — Patient Instructions (Signed)
Do heel cord stretching especially in the morning. Use the treadmill but keep it flat. Cut out cardboard to the size of your heel and put in your shoe. Advil or Aleve as fine. Have more trouble let me know

## 2015-11-10 NOTE — Progress Notes (Signed)
   Subjective:    Patient ID: Amber Church, female    DOB: [birth year], 60 y.o.   MRN: [id number]  HPI She is here for evaluation of bilateral heel pain for the last 3 months. She started an exercise program around the same time doing treadmill as well as cycling. She does set the treadmill on a slight incline. She has a previous history of plantar fasciitis however this pain is near the insertion of the Achilles rather than over the calcaneal spur.   Review of Systems     Objective:   Physical Exam Tender to palpation bilaterally at the insertion of the Achilles tendon. No tenderness over calcaneal spur. Full motion of the ankle. Achilles tendon shows no palpable lesions.       Assessment & Plan:  Achilles tendinitis of both lower extremities  Lower abdominal pain - Plan: POCT Urinalysis Dipstick The lower abdominal pain diagnosis and urinalysis was entered in error. Recommend heel cord stretches, use a treadmill at 0. Also recommend putting a slight heel lift in with cardboard to change the anatomy slightly. If continued difficulty, she will return here for further evaluation and treatment.

## 2015-11-13 NOTE — Progress Notes (Signed)
Order(s) created erroneously. Erroneous order ID: [id number]  Order moved by: Willy Eddy  Order move date/time: 11/13/2015 7:58 AM  Source Patient: [id number]  Source Contact: 11/10/2015  Destination Patient: [id number]  Destination Contact: 03/24/2012

## 2016-01-15 ENCOUNTER — Ambulatory Visit (INDEPENDENT_AMBULATORY_CARE_PROVIDER_SITE_OTHER): Payer: 59 | Admitting: Family Medicine

## 2016-01-15 ENCOUNTER — Encounter: Payer: Self-pay | Admitting: Family Medicine

## 2016-01-15 VITALS — BP 110/70 | HR 75 | Resp 18 | Ht 64.5 in | Wt 203.2 lb

## 2016-01-15 DIAGNOSIS — M7661 Achilles tendinitis, right leg: Secondary | ICD-10-CM | POA: Diagnosis not present

## 2016-01-15 DIAGNOSIS — Z23 Encounter for immunization: Secondary | ICD-10-CM

## 2016-01-15 DIAGNOSIS — E785 Hyperlipidemia, unspecified: Secondary | ICD-10-CM

## 2016-01-15 DIAGNOSIS — I10 Essential (primary) hypertension: Secondary | ICD-10-CM | POA: Diagnosis not present

## 2016-01-15 DIAGNOSIS — Z1159 Encounter for screening for other viral diseases: Secondary | ICD-10-CM

## 2016-01-15 DIAGNOSIS — Z0000 Encounter for general adult medical examination without abnormal findings: Secondary | ICD-10-CM | POA: Diagnosis not present

## 2016-01-15 DIAGNOSIS — M7662 Achilles tendinitis, left leg: Secondary | ICD-10-CM | POA: Diagnosis not present

## 2016-01-15 DIAGNOSIS — E669 Obesity, unspecified: Secondary | ICD-10-CM | POA: Diagnosis not present

## 2016-01-15 DIAGNOSIS — M1990 Unspecified osteoarthritis, unspecified site: Secondary | ICD-10-CM

## 2016-01-15 LAB — CBC WITH DIFFERENTIAL/PLATELET
Basophils Absolute: 0 {cells}/uL (ref 0–200)
Basophils Relative: 0 %
Eosinophils Absolute: 164 {cells}/uL (ref 15–500)
Eosinophils Relative: 2 %
HCT: 40.6 % (ref 35.0–45.0)
Hemoglobin: 13.1 g/dL (ref 11.7–15.5)
Lymphocytes Relative: 35 %
Lymphs Abs: 2870 {cells}/uL (ref 850–3900)
MCH: 28.7 pg (ref 27.0–33.0)
MCHC: 32.3 g/dL (ref 32.0–36.0)
MCV: 89 fL (ref 80.0–100.0)
MPV: 9.8 fL (ref 7.5–12.5)
Monocytes Absolute: 574 {cells}/uL (ref 200–950)
Monocytes Relative: 7 %
Neutro Abs: 4592 {cells}/uL (ref 1500–7800)
Neutrophils Relative %: 56 %
Platelets: 351 10*3/uL (ref 140–400)
RBC: 4.56 MIL/uL (ref 3.80–5.10)
RDW: 13.5 % (ref 11.0–15.0)
WBC: 8.2 10*3/uL (ref 4.0–10.5)

## 2016-01-15 LAB — COMPREHENSIVE METABOLIC PANEL WITH GFR
ALT: 13 U/L (ref 6–29)
AST: 20 U/L (ref 10–35)
Albumin: 4 g/dL (ref 3.6–5.1)
Alkaline Phosphatase: 65 U/L (ref 33–130)
BUN: 12 mg/dL (ref 7–25)
CO2: 26 mmol/L (ref 20–31)
Calcium: 9.3 mg/dL (ref 8.6–10.4)
Chloride: 104 mmol/L (ref 98–110)
Creat: 0.89 mg/dL (ref 0.50–1.05)
Glucose, Bld: 91 mg/dL (ref 65–99)
Potassium: 4.4 mmol/L (ref 3.5–5.3)
Sodium: 139 mmol/L (ref 135–146)
Total Bilirubin: 0.5 mg/dL (ref 0.2–1.2)
Total Protein: 7.3 g/dL (ref 6.1–8.1)

## 2016-01-15 LAB — POCT URINALYSIS DIPSTICK
BILIRUBIN UA: NEGATIVE
GLUCOSE UA: NEGATIVE
KETONES UA: NEGATIVE
Nitrite, UA: NEGATIVE
Protein, UA: NEGATIVE
Spec Grav, UA: >=1.03
Urobilinogen, UA: NEGATIVE
pH, UA: 6

## 2016-01-15 LAB — LIPID PANEL
Cholesterol: 165 mg/dL (ref <200)
HDL: 59 mg/dL (ref >50)
LDL Cholesterol: 85 mg/dL (ref <100)
TRIGLYCERIDES: 105 mg/dL (ref <150)
Total CHOL/HDL Ratio: 2.8 Ratio (ref <5.0)
VLDL: 21 mg/dL (ref <30)

## 2016-01-15 MED ORDER — PRAVASTATIN SODIUM 40 MG PO TABS: 40.0000 mg | ORAL_TABLET | Freq: Every day | ORAL | 3 refills | Status: DC

## 2016-01-15 MED ORDER — CELECOXIB 200 MG PO CAPS: ORAL_CAPSULE | ORAL | 3 refills | Status: DC

## 2016-01-15 MED ORDER — LISINOPRIL-HYDROCHLOROTHIAZIDE 10-12.5 MG PO TABS: 1.0000 | ORAL_TABLET | Freq: Every day | ORAL | 3 refills | Status: DC

## 2016-01-15 NOTE — Progress Notes (Signed)
   Subjective:    Patient ID: Amber Church, female    DOB: 08-Sep-1955, 61 y.o.   MRN: [id number]  HPI She is here for a complete examination. She continues to have bilateral heel pain in spite of using heel lifts. She's been using Celebrex which has helped in the past for her knee pain. This has interfered with her physical activity. She also states it's interfering with her sleep mainly in having difficulty with heel pain. She continues on lisinopril for her pressure as well as pravastatin for hyperlipidemia. She has no other concerns or complaints. Family and social history as well as health maintenance and immunizations were reviewed.   Review of Systems  All other systems reviewed and are negative.      Objective:   Physical Exam BP 110/70   Pulse 75   Resp 18   Ht 5' 4.5" (1.638 m)   Wt 203 lb 3.2 oz (92.2 kg)   SpO2 96%   BMI 34.34 kg/m   General Appearance:    Alert, cooperative, no distress, appears stated age  Head:    Normocephalic, without obvious abnormality, atraumatic  Eyes:    PERRL, conjunctiva/corneas clear, EOM's intact, fundi    benign  Ears:    Normal TM's and external ear canals  Nose:   Nares normal, mucosa normal, no drainage or sinus   tenderness  Throat:   Lips, mucosa, and tongue normal; teeth and gums normal  Neck:   Supple, no lymphadenopathy;  thyroid:  no   enlargement/tenderness/nodules; no carotid   bruit or JVD     Lungs:     Clear to auscultation bilaterally without wheezes, rales or     ronchi; respirations unlabored      Heart:    Regular rate and rhythm, S1 and S2 normal, no murmur, rub   or gallop  Breast Exam:    Deferred to GYN  Abdomen:     Soft, non-tender, nondistended, normoactive bowel sounds,    no masses, no hepatosplenomegaly  Genitalia:    Deferred to GYN     Extremities:   No clubbing, cyanosis or edema. Tender to palpation over the insertion of the Achilles tendon bilaterally.   Pulses:   2+ and symmetric all  extremities  Skin:   Skin color, texture, turgor normal, no rashes or lesions  Lymph nodes:   Cervical, supraclavicular, and axillary nodes normal  Neurologic:   CNII-XII intact, normal strength, sensation and gait; reflexes 2+ and symmetric throughout          Psych:   Normal mood, affect, hygiene and grooming.          Assessment & Plan:  Annual physical exam - Plan: Urinalysis Dipstick, CBC with Differential/Platelet, Comprehensive metabolic panel, Lipid panel  Achilles tendinitis of both lower extremities - Plan: Ambulatory referral to Physical Therapy  Need for prophylactic vaccination and inoculation against influenza - Plan: Flu Vaccine QUAD 36+ mos IM  Need for hepatitis C screening test - Plan: Hepatitis C antibody  Obesity (BMI 30-39.9)  Arthritis - Plan: celecoxib (CELEBREX) 200 MG capsule  Essential hypertension - Plan: lisinopril-hydrochlorothiazide (PRINZIDE,ZESTORETIC) 10-12.5 MG tablet  Hyperlipidemia LDL goal <100 - Plan: pravastatin (PRAVACHOL) 40 MG tablet, Lipid panel . Immunizations were updated. We'll also send her to physical therapy to help with the Achilles tendinitis with iontophoresis and also good knee rehabilitation program.

## 2016-01-15 NOTE — Patient Instructions (Addendum)
You need  to do the heel lifts. Use 2 Celebrex twice per day and also heat for 20 minutes 3 times per dayInsomnia Insomnia is a sleep disorder that makes it difficult to fall asleep or to stay asleep. Insomnia can cause tiredness (fatigue), low energy, difficulty concentrating, mood swings, and poor performance at work or school. There are three different ways to classify insomnia:  Difficulty falling asleep.  Difficulty staying asleep.  Waking up too early in the morning. Any type of insomnia can be long-term (chronic) or short-term (acute). Both are common. Short-term insomnia usually lasts for three months or less. Chronic insomnia occurs at least three times a week for longer than three months. What are the causes? Insomnia may be caused by another condition, situation, or substance, such as:  Anxiety.  Certain medicines.  Gastroesophageal reflux disease (GERD) or other gastrointestinal conditions.  Asthma or other breathing conditions.  Restless legs syndrome, sleep apnea, or other sleep disorders.  Chronic pain.  Menopause. This may include hot flashes.  Stroke.  Abuse of alcohol, tobacco, or illegal drugs.  Depression.  Caffeine.  Neurological disorders, such as Alzheimer disease.  An overactive thyroid (hyperthyroidism). The cause of insomnia may not be known. What increases the risk? Risk factors for insomnia include:  Gender. Women are more commonly affected than men.  Age. Insomnia is more common as you get older.  Stress. This may involve your professional or personal life.  Income. Insomnia is more common in people with lower income.  Lack of exercise.  Irregular work schedule or night shifts.  Traveling between different time zones. What are the signs or symptoms? If you have insomnia, trouble falling asleep or trouble staying asleep is the main symptom. This may lead to other symptoms, such as:  Feeling fatigued.  Feeling nervous about going  to sleep.  Not feeling rested in the morning.  Having trouble concentrating.  Feeling irritable, anxious, or depressed. How is this treated? Treatment for insomnia depends on the cause. If your insomnia is caused by an underlying condition, treatment will focus on addressing the condition. Treatment may also include:  Medicines to help you sleep.  Counseling or therapy.  Lifestyle adjustments. Follow these instructions at home:  Take medicines only as directed by your health care provider.  Keep regular sleeping and waking hours. Avoid naps.  Keep a sleep diary to help you and your health care provider figure out what could be causing your insomnia. Include:  When you sleep.  When you wake up during the night.  How well you sleep.  How rested you feel the next day.  Any side effects of medicines you are taking.  What you eat and drink.  Make your bedroom a comfortable place where it is easy to fall asleep:  Put up shades or special blackout curtains to block light from outside.  Use a white noise machine to block noise.  Keep the temperature cool.  Exercise regularly as directed by your health care provider. Avoid exercising right before bedtime.  Use relaxation techniques to manage stress. Ask your health care provider to suggest some techniques that may work well for you. These may include:  Breathing exercises.  Routines to release muscle tension.  Visualizing peaceful scenes.  Cut back on alcohol, caffeinated beverages, and cigarettes, especially close to bedtime. These can disrupt your sleep.  Do not overeat or eat spicy foods right before bedtime. This can lead to digestive discomfort that can make it hard for you to  sleep.  Limit screen use before bedtime. This includes:  Watching TV.  Using your smartphone, tablet, and computer.  Stick to a routine. This can help you fall asleep faster. Try to do a quiet activity, brush your teeth, and go to bed  at the same time each night.  Get out of bed if you are still awake after 15 minutes of trying to sleep. Keep the lights down, but try reading or doing a quiet activity. When you feel sleepy, go back to bed.  Make sure that you drive carefully. Avoid driving if you feel very sleepy.  Keep all follow-up appointments as directed by your health care provider. This is important. Contact a health care provider if:  You are tired throughout the day or have trouble in your daily routine due to sleepiness.  You continue to have sleep problems or your sleep problems get worse. Get help right away if:  You have serious thoughts about hurting yourself or someone else. This information is not intended to replace advice given to you by your health care provider. Make sure you discuss any questions you have with your health care provider. Document Released: 12/22/1999 Document Revised: 05/26/2015 Document Reviewed: 09/24/2013 Elsevier Interactive Patient Education  2017 [employer].

## 2016-01-16 ENCOUNTER — Encounter: Payer: Self-pay | Admitting: Internal Medicine

## 2016-01-16 LAB — HEPATITIS C ANTIBODY: HCV AB: NEGATIVE

## 2016-01-22 ENCOUNTER — Encounter: Payer: Self-pay | Admitting: Internal Medicine

## 2016-02-15 ENCOUNTER — Ambulatory Visit: Payer: 59 | Attending: Family Medicine | Admitting: Physical Therapy

## 2016-02-15 DIAGNOSIS — M79672 Pain in left foot: Secondary | ICD-10-CM | POA: Diagnosis not present

## 2016-02-15 DIAGNOSIS — R262 Difficulty in walking, not elsewhere classified: Secondary | ICD-10-CM

## 2016-02-15 DIAGNOSIS — M25672 Stiffness of left ankle, not elsewhere classified: Secondary | ICD-10-CM | POA: Diagnosis present

## 2016-02-15 DIAGNOSIS — M25671 Stiffness of right ankle, not elsewhere classified: Secondary | ICD-10-CM | POA: Insufficient documentation

## 2016-02-15 DIAGNOSIS — M79671 Pain in right foot: Secondary | ICD-10-CM | POA: Insufficient documentation

## 2016-02-15 NOTE — Patient Instructions (Addendum)
Plantarflexion: Heel Lift - Lowering (Eccentric) - Double Leg    Rise up on toes, holding support. Slowly lower heels for 3-5 seconds. Bear more weight on affected leg when lowering heels, as tolerated. _10x3__ reps per set, _3__ sets per day, 7__ days per week.   http://ecce.exer.us/17   Copyright  VHI. All rights reserved. Plantarflexion: Heel Lift - Lowering (Eccentric) - Double to Single Leg    Place a quarter under affected big toe joint. Rise up on toes, holding support. Keep weight on quarter. Lift unaffected leg and slowly lower affected heel for 3-5 seconds. _3 x 10__ reps per set, 3___ sets per day, __7_ days per week.      Copyright  VHI. All rights reserved.  Voncille Lo, PT Exercise Expert for the Aging Adult  02/15/16 4:24 PM Phone: [phone] Fax: [phone]

## 2016-02-15 NOTE — Therapy (Signed)
[place], [place], [address] Phone: [phone]   Fax:  [phone]  Physical Therapy Evaluation  Patient Details  Name: Amber Church MRN: [id number] Date of Birth: 07-Apr-1955 Referring Provider: Jill Alexanders MD  Encounter Date: 02/15/2016      PT End of Session - 02/15/16 1730    Visit Number 1   Number of Visits 12   Date for PT Re-Evaluation 03/28/16   Authorization Type UHC   Authorization Time Period 03-28-16   PT Start Time 0345   PT Stop Time 0445   PT Time Calculation (min) 60 min   Activity Tolerance Patient tolerated treatment well   Behavior During Therapy [facility] for tasks assessed/performed      Past Medical History:  Diagnosis Date  . Abnormal EKG   . Abnormal pap    s/p cryotherapy  . Diverticulosis   . Hyperlipidemia   . Hypertension   . S/P colonoscopic polypectomy     Past Surgical History:  Procedure Laterality Date  . COLONOSCOPY  03/18/11; 2000   Dr. Amedeo Plenty; diverticulosis in sigmoid colon  . crysurgery  age 89   for abnormal pap  . LAMINECTOMY  03/2002    There were no vitals filed for this visit.       Subjective Assessment - 02/15/16 1550    Subjective I have had plantar fasciitis before.  Amber Church been having increasing bil achilles tendonitis and worsening in the past 3 months. I joined the [employer] in [place].  I was using the weights and treadmill with an incline on the treadmill   Pertinent History HTN, Obesity, Hypercholesteremia, bil knee OA, Lumbar disc herniation surgery 2006   Limitations Walking;Standing;House hold activities   How long can you sit comfortably? unlimited   How long can you stand comfortably? < 65min   How long can you walk comfortably? <51min   Diagnostic tests none   Patient Stated Goals No pain when walking and working or getting out of my seat   Currently in Pain? Yes   Pain Score 8    Pain Location Leg  bilateral insertional Achilles  Tendonopathy   Pain Orientation Right;Left;Posterior   Pain Descriptors / Indicators Tightness;Aching   Pain Type Chronic pain   Pain Onset More than a month ago   Pain Frequency Constant   Aggravating Factors  getting out of bed, getting up from a sitting and standing position.   Pain Relieving Factors stretching            [facility] PT Assessment - 02/15/16 1611      Assessment   Medical Diagnosis bil achilles tendonitis   Referring Provider Jill Alexanders MD   Onset Date/Surgical Date 11/29/15   Hand Dominance Right   Prior Therapy none     Precautions   Precautions None     Restrictions   Weight Bearing Restrictions No     Balance Screen   Has the patient fallen in the past 6 months No   Has the patient had a decrease in activity level because of a fear of falling?  No   Is the patient reluctant to leave their home because of a fear of falling?  No     Home Social worker Private residence   Living Arrangements Spouse/significant other;Children   Type of [place] One level     Prior Function   Level of Independence  Independent   Vocation Full time employment   Vocation Requirements works as Surveyor, mining on  all day      Cognition   Overall Cognitive Status Within Functional Limits for tasks assessed     Observation/Other Assessments   Focus on Therapeutic Outcomes (FOTO)  FOTO intake 26% limitation 74% predicted 46%      Posture/Postural Control   Posture/Postural Control Postural limitations   Postural Limitations Flexed trunk;Rounded Shoulders;Forward head   Posture Comments pes planus bil with right foot more supination  stiffness dominant     AROM   Right Ankle Dorsiflexion 5   Right Ankle Plantar Flexion 50   Right Ankle Inversion 22   Right Ankle Eversion 19   Left Ankle Dorsiflexion 3   Left Ankle Plantar Flexion 55   Left Ankle Inversion 38   Left Ankle Eversion 24      Strength   Right Hip Flexion 4+/5   Right Hip Extension 4-/5   Right Hip ABduction 4-/5   Left Hip Flexion 4+/5   Left Hip Extension 4-/5   Left Hip ABduction 4-/5   Right Knee Flexion 5/5   Right Knee Extension 5/5   Left Knee Flexion 5/5   Left Knee Extension 5/5   Right Ankle Dorsiflexion 3-/5   Right Ankle Plantar Flexion 4-/5   Left Ankle Dorsiflexion 3-/5   Left Ankle Plantar Flexion 4-/5     Flexibility   Hamstrings bil 60 , bil hip flexor tightness + thomas test     Palpation   Palpation comment tenderness over bil achilles tendon with more intensity on medial portion     Ambulation/Gait   Gait Comments Pt with wide based gait and difficulty walking due to pain , shortened stride length bil                   [facility] Adult PT Treatment/Exercise - 02/15/16 1611      Self-Care   Self-Care Other Self-Care Comments   Other Self-Care Comments  given bil heel lifts for comfort for walking at work      Manual Therapy   Manual Therapy Soft tissue mobilization   Soft tissue mobilization IASTYM bil achilles tendon      Ankle Exercises: Standing   Other Standing Ankle Exercises plantar flexion heel lift with eccentric lowering 3 x 10,  bil plantarflexion with SLS lowering 3 x 10 right and left                 PT Education - 02/15/16 1726    Education provided Yes   Education Details POC, Explanation of findings.  Initial HEP and heel lifts bil for ease of walking, education on achilles tendonopathy   Person(s) Educated Patient   Methods Explanation;Demonstration;Verbal cues;Handout   Comprehension Verbalized understanding;Returned demonstration          PT Short Term Goals - 02/15/16 1736      PT SHORT TERM GOAL #1   Title STG=LTG           PT Long Term Goals - 02/15/16 1736      PT LONG TERM GOAL #1   Title "Pt will be independent with advanced HEP. Target date 03-28-16     PT LONG TERM GOAL #2   Title "Pt will tolerate standing and  walking for 2 hours without increased pain in achilles in order to return to PLOF and work. Target date 03-28-16   Time 6   Period Weeks  Status New     PT LONG TERM GOAL #3   Title "FOTO will improve from  74% limitation  to  46% limitation    indicating improved functional mobility Target date 03-28-16   Time 6   Period Weeks   Status New     PT LONG TERM GOAL #4   Title Pt will be able to rise from sitting/driving with pain level equal or < 3/10 or less Target Date 03-28-16   Time 6   Period Weeks   Status New     PT LONG TERM GOAL #5   Title Pt will be able to return to consistent walking for exercises for 30 minutes 5 x a week target Date 03-28-16   Time 6   Period Weeks   Status New               Plan - 02/15/16 1635    Clinical Impression Statement 61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 Ms. Bien has  impairments including bil heel pain, limited DF ROM, forward flexed posture, Bil hip flexor tightness, and bil hip abductor weakness, and as a result, pt is limited with ADLs and functional activities. Pt would benefit from skilled outpatient PT services for 2 times a week for 6 weeks to progress toward pain-free PLOF. in order to return to exercises    Rehab Potential Good   PT Frequency 2x / week   PT Duration 6 weeks   PT Treatment/Interventions ADLs/Self Care Home Management;Taping;Dry needling;Passive range of motion;Manual techniques;Therapeutic exercise;Neuromuscular re-education;Patient/family education;Stair training;Gait training;Ultrasound;Moist Heat;Cryotherapy;Electrical Stimulation;Iontophoresis 4mg /ml Dexamethasone   PT Next Visit Plan Discuss achilles Tendonopathy timeline.  Progress exercise for Alfredson protocol.  Assess benefit of heel lifts for working and walking and exercising without   PT Home Exercise Plan Eccentric plantarflexion heel lift eccentric lowering  bil and SLS   Consulted and Agree with Plan of Care Patient      Patient will benefit from skilled therapeutic intervention in order to improve the following deficits and impairments:  Difficulty walking, Obesity, Pain, Postural dysfunction, Increased fascial restricitons, Hypomobility, Decreased strength, Decreased mobility, Decreased range of motion, Decreased activity tolerance  Visit Diagnosis: Pain in left foot  Pain in right foot  Stiffness of left ankle, not elsewhere classified  Stiffness of right ankle, not elsewhere classified  Difficulty in walking, not elsewhere classified     Problem List Patient Active Problem List   Diagnosis Date Noted  . Hypertension 08/06/2010  . Hyperlipidemia LDL goal <100 08/06/2010  . Obesity (BMI 30-39.9) 08/06/2010   Voncille Lo, PT Exercise Expert for the Aging Adult  02/15/16 5:46 PM Phone: [phone] Fax: [phone]   By signing I understand that I am ordering/authorizing the use of Iontophoresis using 4 mg/mL of dexamethasone as a component of this plan of care. [place], [place], [address] Phone: [phone]   Fax:  [phone]  Name: Amber Church MRN: [id number] Date of Birth: [birth year]

## 2016-02-21 ENCOUNTER — Ambulatory Visit: Payer: 59 | Admitting: Physical Therapy

## 2016-02-21 DIAGNOSIS — M79671 Pain in right foot: Secondary | ICD-10-CM

## 2016-02-21 DIAGNOSIS — M79672 Pain in left foot: Secondary | ICD-10-CM | POA: Diagnosis not present

## 2016-02-21 DIAGNOSIS — M25671 Stiffness of right ankle, not elsewhere classified: Secondary | ICD-10-CM

## 2016-02-21 DIAGNOSIS — M25672 Stiffness of left ankle, not elsewhere classified: Secondary | ICD-10-CM

## 2016-02-22 ENCOUNTER — Encounter: Payer: Self-pay | Admitting: Physical Therapy

## 2016-02-22 NOTE — Therapy (Signed)
[place], [place], [address] Phone: [phone]   Fax:  [phone]  Physical Therapy Treatment  Patient Details  Name: Amber Church MRN: [id number] Date of Birth: [birth year] Referring Provider: Jill Alexanders MD  Encounter Date: 02/21/2016      PT End of Session - 02/22/16 1718    Visit Number 1   Number of Visits 12   Date for PT Re-Evaluation 03/28/16   Authorization Type UHC   Authorization Time Period 03-28-16   PT Start Time 1545   PT Stop Time 1629   PT Time Calculation (min) 44 min   Activity Tolerance Patient tolerated treatment well   Behavior During Therapy [facility] for tasks assessed/performed      Past Medical History:  Diagnosis Date  . Abnormal EKG   . Abnormal pap    s/p cryotherapy  . Diverticulosis   . Hyperlipidemia   . Hypertension   . S/P colonoscopic polypectomy     Past Surgical History:  Procedure Laterality Date  . COLONOSCOPY  03/18/11; 2000   Dr. Amedeo Plenty; diverticulosis in sigmoid colon  . crysurgery  age 61   for abnormal pap  . LAMINECTOMY  03/2002    There were no vitals filed for this visit.      Subjective Assessment - 02/22/16 1717    Subjective Patient reports the left may be a little worse but the right is a little better. She has been consistent with her exercises.    Pertinent History HTN, Obesity, Hypercholesteremia, bil knee OA, Lumbar disc herniation surgery 2006   Limitations Walking;Standing;House hold activities   How long can you sit comfortably? unlimited   How long can you stand comfortably? < 62min   How long can you walk comfortably? <1min   Diagnostic tests none   Patient Stated Goals No pain when walking and working or getting out of my seat   Currently in Pain? Yes   Pain Score 8    Pain Location Leg   Pain Orientation Right;Left;Posterior   Pain Descriptors / Indicators Aching   Pain Type Chronic pain   Pain Onset More than a  month ago   Pain Frequency Constant   Aggravating Factors  getting out of bed, standing    Pain Relieving Factors stretching    Multiple Pain Sites No                         [facility] Adult PT Treatment/Exercise - 02/22/16 0001      Manual Therapy   Manual therapy comments bilateral anterior drwaer glides to improve dorsi flexion; tirgger ppoint release to gastroc    Soft tissue mobilization IASTYM bil achilles tendon      Ankle Exercises: Seated   Other Seated Ankle Exercises 4 way yellow band 2x10      Ankle Exercises: Standing   Other Standing Ankle Exercises Standing flexion 2x10                   PT Short Term Goals - 02/15/16 1736      PT SHORT TERM GOAL #1   Title STG=LTG           PT Long Term Goals - 02/22/16 1721      PT LONG TERM GOAL #1   Title "Pt will be independent with advanced HEP. Target date 03-28-16   Baseline performing at home    Period Weeks   Status On-going  PT LONG TERM GOAL #2   Title "Pt will tolerate standing and walking for 2 hours without increased pain in achilles in order to return to PLOF and work. Target date 03-28-16   Time 6   Period Weeks   Status On-going     PT LONG TERM GOAL #3   Title "FOTO will improve from  74% limitation  to  46% limitation    indicating improved functional mobility Target date 03-28-16   Time 6   Period Weeks   Status On-going     PT LONG TERM GOAL #4   Title Pt will be able to rise from sitting/driving with pain level equal or < 3/10 or less Target Date 03-28-16   Time 6   Period Weeks   Status On-going     PT LONG TERM GOAL #5   Title Pt will be able to return to consistent walking for exercises for 30 minutes 5 x a week target Date 03-28-16   Time 6   Period Weeks   Status On-going               Plan - 02/22/16 1719    Clinical Impression Statement Patient had some pain with soft tissue workj but overall she flet like her pain was improved after manault  therapy. Patient showed standing strtch for work but advised to go easy with it. Therapy worked on [place]. She was advised to continue with the alferdson protocol at home.    Rehab Potential Good   PT Frequency 2x / week   PT Duration 6 weeks   PT Treatment/Interventions ADLs/Self Care Home Management;Taping;Dry needling;Passive range of motion;Manual techniques;Therapeutic exercise;Neuromuscular re-education;Patient/family education;Stair training;Gait training;Ultrasound;Moist Heat;Cryotherapy;Electrical Stimulation;Iontophoresis 4mg /ml Dexamethasone   PT Next Visit Plan Discuss achilles Tendonopathy timeline.  Progress exercise for Alfredson protocol.  Assess benefit of heel lifts for working and walking and exercising without   PT Home Exercise Plan Eccentric plantarflexion heel lift eccentric lowering bil and SLS   Consulted and Agree with Plan of Care Patient      Patient will benefit from skilled therapeutic intervention in order to improve the following deficits and impairments:  Difficulty walking, Obesity, Pain, Postural dysfunction, Increased fascial restricitons, Hypomobility, Decreased strength, Decreased mobility, Decreased range of motion, Decreased activity tolerance  Visit Diagnosis: Pain in left foot  Pain in right foot  Stiffness of left ankle, not elsewhere classified  Stiffness of right ankle, not elsewhere classified     Problem List Patient Active Problem List   Diagnosis Date Noted  . Hypertension 08/06/2010  . Hyperlipidemia LDL goal <100 08/06/2010  . Obesity (BMI 30-39.9) 08/06/2010    Carney Living PT DPT  02/22/2016, 5:24 PM  [facility] [address] [place], [place], [address] Phone: [phone]   Fax:  [phone]  Name: Amber Church MRN: [id number] Date of Birth: [birth year]

## 2016-02-27 ENCOUNTER — Ambulatory Visit: Payer: 59 | Admitting: Physical Therapy

## 2016-02-27 DIAGNOSIS — M25671 Stiffness of right ankle, not elsewhere classified: Secondary | ICD-10-CM

## 2016-02-27 DIAGNOSIS — R262 Difficulty in walking, not elsewhere classified: Secondary | ICD-10-CM

## 2016-02-27 DIAGNOSIS — M25672 Stiffness of left ankle, not elsewhere classified: Secondary | ICD-10-CM

## 2016-02-27 DIAGNOSIS — M79672 Pain in left foot: Secondary | ICD-10-CM | POA: Diagnosis not present

## 2016-02-27 DIAGNOSIS — M79671 Pain in right foot: Secondary | ICD-10-CM

## 2016-02-28 ENCOUNTER — Encounter: Payer: Self-pay | Admitting: Physical Therapy

## 2016-02-28 NOTE — Therapy (Signed)
[place], [place], [address] Phone: [phone]   Fax:  [phone]  Physical Therapy Treatment  Patient Details  Name: Amber Church MRN: [id number] Date of Birth: 10-18-1955 Referring Provider: Jill Alexanders MD  Encounter Date: 02/27/2016      PT End of Session - 02/28/16 1003    Visit Number 3   Number of Visits 12   Date for PT Re-Evaluation 03/28/16   Authorization Type UHC   Authorization Time Period 03-28-16   PT Start Time 1630   PT Stop Time 1709   PT Time Calculation (min) 39 min   Activity Tolerance Patient tolerated treatment well   Behavior During Therapy [facility] for tasks assessed/performed      Past Medical History:  Diagnosis Date  . Abnormal EKG   . Abnormal pap    s/p cryotherapy  . Diverticulosis   . Hyperlipidemia   . Hypertension   . S/P colonoscopic polypectomy     Past Surgical History:  Procedure Laterality Date  . COLONOSCOPY  03/18/11; 2000   Dr. Amedeo Plenty; diverticulosis in sigmoid colon  . crysurgery  age 84   for abnormal pap  . LAMINECTOMY  03/2002    There were no vitals filed for this visit.      Subjective Assessment - 02/28/16 0957    Subjective Patient reports that over the past few days her pain has improved significantly., She is still having some medial and lateral pain on the left side of her achillies. She dosent have any pain on the right side. She has been working all day.    Pertinent History HTN, Obesity, Hypercholesteremia, bil knee OA, Lumbar disc herniation surgery 2006   Limitations Walking;Standing;House hold activities   How long can you sit comfortably? unlimited   How long can you stand comfortably? < 34min   How long can you walk comfortably? <44min   Diagnostic tests none   Patient Stated Goals No pain when walking and working or getting out of my seat   Currently in Pain? Yes   Pain Score 4    Pain Location Ankle   Pain Orientation  Left   Pain Descriptors / Indicators Aching   Pain Type Chronic pain   Pain Onset More than a month ago   Pain Frequency Constant   Aggravating Factors  getting in and out of bed    Pain Relieving Factors stretching    Multiple Pain Sites No                         [facility] Adult PT Treatment/Exercise - 02/28/16 0001      Manual Therapy   Manual therapy comments bilateral anterior drwaer glides to improve dorsi flexion; tirgger ppoint release to gastroc    Soft tissue mobilization IASTYM bil achilles tendon      Ankle Exercises: Seated   Other Seated Ankle Exercises 4 way red band 2x10                 PT Education - 02/28/16 1003    Education provided Yes   Education Details continue with current HEP    Person(s) Educated Patient   Methods Explanation;Demonstration;Verbal cues   Comprehension Verbalized understanding;Returned demonstration          PT Short Term Goals - 02/15/16 1736      PT SHORT TERM GOAL #1   Title STG=LTG  PT Long Term Goals - 02/22/16 1721      PT LONG TERM GOAL #1   Title "Pt will be independent with advanced HEP. Target date 03-28-16   Baseline performing at home    Period Weeks   Status On-going     PT LONG TERM GOAL #2   Title "Pt will tolerate standing and walking for 2 hours without increased pain in achilles in order to return to PLOF and work. Target date 03-28-16   Time 6   Period Weeks   Status On-going     PT LONG TERM GOAL #3   Title "FOTO will improve from  74% limitation  to  46% limitation    indicating improved functional mobility Target date 03-28-16   Time 6   Period Weeks   Status On-going     PT LONG TERM GOAL #4   Title Pt will be able to rise from sitting/driving with pain level equal or < 3/10 or less Target Date 03-28-16   Time 6   Period Weeks   Status On-going     PT LONG TERM GOAL #5   Title Pt will be able to return to consistent walking for exercises for 30 minutes 5 x a  week target Date 03-28-16   Time 6   Period Weeks   Status On-going               Plan - 02/28/16 1004    Clinical Impression Statement Patient is making good progres. Therapy advanced her band. Therapy encouraged her to continue with the Alferdson Protocol. She tolerated treatment well. Therapy continues to focus on manual therapy and strengthening.    Rehab Potential Good   PT Frequency 2x / week   PT Duration 6 weeks   PT Treatment/Interventions ADLs/Self Care Home Management;Taping;Dry needling;Passive range of motion;Manual techniques;Therapeutic exercise;Neuromuscular re-education;Patient/family education;Stair training;Gait training;Ultrasound;Moist Heat;Cryotherapy;Electrical Stimulation;Iontophoresis 4mg /ml Dexamethasone   PT Next Visit Plan Discuss achilles Tendonopathy timeline.  Progress exercise for Alfredson protocol.  Assess benefit of heel lifts for working and walking and exercising without. COntinue with HEP    PT Home Exercise Plan Eccentric plantarflexion heel lift eccentric lowering bil and SLS   Consulted and Agree with Plan of Care Patient      Patient will benefit from skilled therapeutic intervention in order to improve the following deficits and impairments:  Difficulty walking, Obesity, Pain, Postural dysfunction, Increased fascial restricitons, Hypomobility, Decreased strength, Decreased mobility, Decreased range of motion, Decreased activity tolerance  Visit Diagnosis: Pain in left foot  Pain in right foot  Stiffness of left ankle, not elsewhere classified  Stiffness of right ankle, not elsewhere classified  Difficulty in walking, not elsewhere classified     Problem List Patient Active Problem List   Diagnosis Date Noted  . Hypertension 08/06/2010  . Hyperlipidemia LDL goal <100 08/06/2010  . Obesity (BMI 30-39.9) 08/06/2010    Carney Living PT DPT  02/28/2016, 10:07 AM  [facility] [address] [place], [place], [address] Phone: [phone]   Fax:  [phone]  Name: [place] MRN: [id number] Date of Birth: 09-Apr-1955

## 2016-02-29 ENCOUNTER — Ambulatory Visit: Payer: 59 | Admitting: Physical Therapy

## 2016-03-05 ENCOUNTER — Encounter: Payer: 59 | Admitting: Physical Therapy

## 2016-03-07 ENCOUNTER — Ambulatory Visit: Payer: 59 | Admitting: Physical Therapy

## 2016-03-12 ENCOUNTER — Encounter: Payer: 59 | Admitting: Physical Therapy

## 2016-03-14 ENCOUNTER — Ambulatory Visit: Payer: 59 | Attending: Family Medicine | Admitting: Physical Therapy

## 2016-03-14 DIAGNOSIS — R262 Difficulty in walking, not elsewhere classified: Secondary | ICD-10-CM | POA: Diagnosis present

## 2016-03-14 DIAGNOSIS — M25671 Stiffness of right ankle, not elsewhere classified: Secondary | ICD-10-CM

## 2016-03-14 DIAGNOSIS — M79671 Pain in right foot: Secondary | ICD-10-CM | POA: Insufficient documentation

## 2016-03-14 DIAGNOSIS — M25672 Stiffness of left ankle, not elsewhere classified: Secondary | ICD-10-CM | POA: Diagnosis present

## 2016-03-14 DIAGNOSIS — M79672 Pain in left foot: Secondary | ICD-10-CM | POA: Diagnosis present

## 2016-03-14 NOTE — Therapy (Signed)
[place], [place], [address] Phone: [phone]   Fax:  [phone]  Physical Therapy Treatment  Patient Details  Name: Amber Church MRN: [id number] Date of Birth: December 06, 1955 Referring Provider: Jill Alexanders MD  Encounter Date: 03/14/2016      PT End of Session - 03/14/16 1504    Visit Number 4   Number of Visits 12   Date for PT Re-Evaluation 03/28/16   Authorization Type UHC   Authorization Time Period 03-28-16   PT Start Time 0300   PT Stop Time 0344   PT Time Calculation (min) 44 min   Activity Tolerance Patient tolerated treatment well   Behavior During Therapy [facility] for tasks assessed/performed      Past Medical History:  Diagnosis Date  . Abnormal EKG   . Abnormal pap    s/p cryotherapy  . Diverticulosis   . Hyperlipidemia   . Hypertension   . S/P colonoscopic polypectomy     Past Surgical History:  Procedure Laterality Date  . COLONOSCOPY  03/18/11; 2000   Dr. Amedeo Plenty; diverticulosis in sigmoid colon  . crysurgery  age 61   for abnormal pap  . LAMINECTOMY  03/2002    There were no vitals filed for this visit.      Subjective Assessment - 03/14/16 1505    Subjective Pt reports that right leg is better than the left.  She feels like she is making improvements   Pertinent History HTN, Obesity, Hypercholesteremia, bil knee OA, Lumbar disc herniation surgery 2006   Limitations Walking;Standing;House hold activities   How long can you sit comfortably? unlimited   Currently in Pain? Yes   Pain Score 4    Pain Location Ankle   Pain Orientation Left   Pain Descriptors / Indicators Aching   Pain Type Chronic pain   Multiple Pain Sites Yes   Pain Score 2   Pain Location Ankle   Pain Orientation Right   Pain Descriptors / Indicators Aching   Pain Type Chronic pain                         [facility] Adult PT Treatment/Exercise - 03/14/16 1507      Manual Therapy    Manual therapy comments bilateral anterior drwaer glides to improve dorsi flexion; tirgger ppoint release to gastroc    Soft tissue mobilization IASTYM bil achilles tendon      Ankle Exercises: Seated   Other Seated Ankle Exercises 4 way red band 2x10      Ankle Exercises: Standing   Other Standing Ankle Exercises Alfredson protocol bil standing heel raise on steps. 3 x 15, then, single limb on left 3 x 10,  on right 2 x 10  patient with decreased endurance and pain                PT Education - 03/14/16 1714    Education provided Yes   Education Details Pt educated and return demo on Neurosurgeon) Educated Patient   Methods Explanation;Demonstration;Handout;Verbal cues;Tactile cues   Comprehension Verbalized understanding;Returned demonstration          PT Short Term Goals - 02/15/16 1736      PT SHORT TERM GOAL #1   Title STG=LTG           PT Long Term Goals - 02/22/16 1721      PT LONG TERM GOAL #1   Title "Pt  will be independent with advanced HEP. Target date 03-28-16   Baseline performing at home    Period Weeks   Status On-going     PT LONG TERM GOAL #2   Title "Pt will tolerate standing and walking for 2 hours without increased pain in achilles in order to return to PLOF and work. Target date 03-28-16   Time 6   Period Weeks   Status On-going     PT LONG TERM GOAL #3   Title "FOTO will improve from  74% limitation  to  46% limitation    indicating improved functional mobility Target date 03-28-16   Time 6   Period Weeks   Status On-going     PT LONG TERM GOAL #4   Title Pt will be able to rise from sitting/driving with pain level equal or < 3/10 or less Target Date 03-28-16   Time 6   Period Weeks   Status On-going     PT LONG TERM GOAL #5   Title Pt will be able to return to consistent walking for exercises for 30 minutes 5 x a week target Date 03-28-16   Time 6   Period Weeks   Status On-going               Plan  - 03/14/16 1717    Clinical Impression Statement Pt is able to walk into clinic with slight antalgic gait but she is gaurded in her stepping. Pt comments that she is 4/10 pain from 8/10 pain on evaluation.  Pt  can only make clinic visits 1 x a week and will try to  continue.  She was introduced to trigger point dry needling concept and she declined this visit but may be open in the future.  Pt given increase in HEP( Alfredson protocol) to do at home to progress faster   Rehab Potential Good   PT Frequency 2x / week   PT Duration 6 weeks   PT Treatment/Interventions ADLs/Self Care Home Management;Taping;Dry needling;Passive range of motion;Manual techniques;Therapeutic exercise;Neuromuscular re-education;Patient/family education;Stair training;Gait training;Ultrasound;Moist Heat;Cryotherapy;Electrical Stimulation;Iontophoresis 4mg /ml Dexamethasone   PT Next Visit Plan Discuss achilles Tendonopathy timeline.  Progress exercise for Alfredson protocol.  Assess benefit of heel lifts for working and walking and exercising without. COntinue with HEP    PT Home Exercise Plan Eccentric plantarflexion heel lift eccentric lowering bil and SLS, Alfredson protocol on step   Consulted and Agree with Plan of Care Patient      Patient will benefit from skilled therapeutic intervention in order to improve the following deficits and impairments:  Difficulty walking, Obesity, Pain, Postural dysfunction, Increased fascial restricitons, Hypomobility, Decreased strength, Decreased mobility, Decreased range of motion, Decreased activity tolerance  Visit Diagnosis: Pain in left foot  Pain in right foot  Stiffness of left ankle, not elsewhere classified  Stiffness of right ankle, not elsewhere classified  Difficulty in walking, not elsewhere classified     Problem List Patient Active Problem List   Diagnosis Date Noted  . Hypertension 08/06/2010  . Hyperlipidemia LDL goal <100 08/06/2010  . Obesity (BMI  30-39.9) 08/06/2010   Voncille Lo, PT Certified Exercise Expert for the Aging Adult  03/14/16 5:20 PM Phone: [phone] Fax: [place] [facility] [address] [place], [place], [address] Phone: [phone]   Fax:  [phone]  Name: SHARMANE DAME MRN: [id number] Date of Birth: 1955/03/05

## 2016-03-19 ENCOUNTER — Encounter: Payer: 59 | Admitting: Physical Therapy

## 2016-03-21 ENCOUNTER — Ambulatory Visit: Payer: 59 | Admitting: Physical Therapy

## 2016-03-21 DIAGNOSIS — M79671 Pain in right foot: Secondary | ICD-10-CM

## 2016-03-21 DIAGNOSIS — M79672 Pain in left foot: Secondary | ICD-10-CM | POA: Diagnosis not present

## 2016-03-21 DIAGNOSIS — M25671 Stiffness of right ankle, not elsewhere classified: Secondary | ICD-10-CM

## 2016-03-21 DIAGNOSIS — M25672 Stiffness of left ankle, not elsewhere classified: Secondary | ICD-10-CM

## 2016-03-21 DIAGNOSIS — R262 Difficulty in walking, not elsewhere classified: Secondary | ICD-10-CM

## 2016-03-21 NOTE — Patient Instructions (Signed)

## 2016-03-21 NOTE — Therapy (Signed)
[place], [place], [address] Phone: [phone]   Fax:  [phone]  Physical Therapy Treatment  Patient Details  Name: [place] MRN: [id number] Date of Birth: March 25, 1955 Referring Provider: Jill Alexanders MD  Encounter Date: 03/21/2016      PT End of Session - 03/21/16 1558    Visit Number 5   Number of Visits 12   Date for PT Re-Evaluation 03/28/16   Authorization Type UHC   Authorization Time Period 03-28-16   PT Start Time 0400   PT Stop Time 0450   PT Time Calculation (min) 50 min   Activity Tolerance Patient tolerated treatment well   Behavior During Therapy [facility] for tasks assessed/performed      Past Medical History:  Diagnosis Date  . Abnormal EKG   . Abnormal pap    s/p cryotherapy  . Diverticulosis   . Hyperlipidemia   . Hypertension   . S/P colonoscopic polypectomy     Past Surgical History:  Procedure Laterality Date  . COLONOSCOPY  03/18/11; 2000   Dr. Amedeo Plenty; diverticulosis in sigmoid colon  . crysurgery  age 26   for abnormal pap  . LAMINECTOMY  03/2002    There were no vitals filed for this visit.      Subjective Assessment - 03/21/16 1600    Subjective My exercises werent real good.  the snow day got me off of my exercises.  the exercises were hard to try.  I did notice that I was able to walk through the warehouse without hurting as bed   Pertinent History HTN, Obesity, Hypercholesteremia, bil knee OA, Lumbar disc herniation surgery 2006   Limitations Walking;Standing;House hold activities   How long can you sit comfortably? unlimited   How long can you stand comfortably? 30 minutes   How long can you walk comfortably? 30 minutes   Diagnostic tests none   Patient Stated Goals No pain when walking and working or getting out of my seat   Currently in Pain? Yes   Pain Score 4    Pain Location Ankle   Pain Orientation Left   Pain Descriptors / Indicators  Aching   Pain Type Chronic pain   Pain Onset More than a month ago   Pain Frequency Constant   Pain Score 1   Pain Orientation Right   Pain Descriptors / Indicators Aching   Pain Type Chronic pain                         [facility] Adult PT Treatment/Exercise - 03/21/16 1652      Iontophoresis   Type of Iontophoresis Dexamethasone   Location left achilles tendon distal end below malleoli, soft tissue lateral edges of achilles tendon on left only    Dose 1cc x 2 patches   Time patch to be worn for 4-6 hours and removed. pt given handout     Manual Therapy   Manual Therapy Soft tissue mobilization   Soft tissue mobilization IASTYM bil achilles tendon      Ankle Exercises: Standing   Heel Raises 15 reps  x 3 bil,  one legged heel raise on edge of stair, 15 x 3 bil   Other Standing Ankle Exercises Eccentric heel raise standing on edge of stair 3 x 15 bil   Other Standing Ankle Exercises sitting heel raise 3 x 15 reps     Ankle Exercises: Stretches   Set designer --  Pt attempted quick- rebounding heel raises x 5  unable to do                PT Education - 03/21/16 1639    Education provided Yes   Education Details Reviewed and instructed on phase 2 alfredson protocol and Iontophoresis precautians   Person(s) Educated Patient   Methods Explanation;Demonstration;Verbal cues;Tactile cues;Handout   Comprehension Verbalized understanding;Returned demonstration          PT Short Term Goals - 02/15/16 1736      PT SHORT TERM GOAL #1   Title STG=LTG           PT Long Term Goals - 02/22/16 1721      PT LONG TERM GOAL #1   Title "Pt will be independent with advanced HEP. Target date 03-28-16   Baseline performing at home    Period Weeks   Status On-going     PT LONG TERM GOAL #2   Title "Pt will tolerate standing and walking for 2 hours without increased pain in achilles in order to return to PLOF and work. Target date 03-28-16   Time 6    Period Weeks   Status On-going     PT LONG TERM GOAL #3   Title "FOTO will improve from  74% limitation  to  46% limitation    indicating improved functional mobility Target date 03-28-16   Time 6   Period Weeks   Status On-going     PT LONG TERM GOAL #4   Title Pt will be able to rise from sitting/driving with pain level equal or < 3/10 or less Target Date 03-28-16   Time 6   Period Weeks   Status On-going     PT LONG TERM GOAL #5   Title Pt will be able to return to consistent walking for exercises for 30 minutes 5 x a week target Date 03-28-16   Time 6   Period Weeks   Status On-going               Plan - 03/21/16 1700    Clinical Impression Statement pt is able to walk into clinic with less antalgic gait but still with decreased stride length.  Pt comments that she is 4/10 pain but notices she is able to walk further at work in [employer]. Pt is also able to demonstrate higher heel raises than previous with at least 2 inch heel height from home.  Pt is able to perform all of phase 2 Alfredson protocol except for Quick rebounding heel raises.   Rehab Potential Good   PT Frequency 2x / week   PT Duration 6 weeks   PT Treatment/Interventions ADLs/Self Care Home Management;Taping;Dry needling;Passive range of motion;Manual techniques;Therapeutic exercise;Neuromuscular re-education;Patient/family education;Stair training;Gait training;Ultrasound;Moist Heat;Cryotherapy;Electrical Stimulation;Iontophoresis 4mg /ml Dexamethasone   PT Next Visit Plan Need goals assessed   Progress exercise for Alfredson protocol as tolerated. Assess Iontophoresis patch on left achilles tendon   PT Home Exercise Plan Eccentric plantarflexion heel lift eccentric lowering bil and SLS, Alfredson protocol on step, heel lift and single heel lift   Consulted and Agree with Plan of Care Patient      Patient will benefit from skilled therapeutic intervention in order to improve the following deficits and  impairments:  Difficulty walking, Obesity, Pain, Postural dysfunction, Increased fascial restricitons, Hypomobility, Decreased strength, Decreased mobility, Decreased range of motion, Decreased activity tolerance  Visit Diagnosis: Pain in left foot  Pain in right foot  Stiffness of left  ankle, not elsewhere classified  Difficulty in walking, not elsewhere classified  Stiffness of right ankle, not elsewhere classified     Problem List Patient Active Problem List   Diagnosis Date Noted  . Hypertension 08/06/2010  . Hyperlipidemia LDL goal <100 08/06/2010  . Obesity (BMI 30-39.9) 08/06/2010    Voncille Lo, PT Certified Exercise Expert for the Aging Adult  03/21/16 5:07 PM Phone: [phone] Fax: [place] [facility] [address] [place], [place], [address] Phone: [phone]   Fax:  [phone]  Name: ISABEL ARDILA MRN: [id number] Date of Birth: 1955/10/03

## 2016-03-27 ENCOUNTER — Ambulatory Visit: Payer: 59 | Admitting: Physical Therapy

## 2016-04-03 ENCOUNTER — Encounter: Payer: Self-pay | Admitting: Physical Therapy

## 2016-04-03 ENCOUNTER — Ambulatory Visit: Payer: 59 | Admitting: Physical Therapy

## 2016-04-03 DIAGNOSIS — M79672 Pain in left foot: Secondary | ICD-10-CM | POA: Diagnosis not present

## 2016-04-03 DIAGNOSIS — R262 Difficulty in walking, not elsewhere classified: Secondary | ICD-10-CM

## 2016-04-03 DIAGNOSIS — M79671 Pain in right foot: Secondary | ICD-10-CM

## 2016-04-03 DIAGNOSIS — M25672 Stiffness of left ankle, not elsewhere classified: Secondary | ICD-10-CM

## 2016-04-03 DIAGNOSIS — M25671 Stiffness of right ankle, not elsewhere classified: Secondary | ICD-10-CM

## 2016-04-03 NOTE — Therapy (Addendum)
[place], [place], [address] Phone: [phone]   Fax:  [phone]  Physical Therapy Treatment/Discharge Note  Patient Details  Name: Amber Church MRN: [id number] Date of Birth: Dec 14, 1955 Referring Provider: Jill Alexanders MD  Encounter Date: 04/03/2016      PT End of Session - 04/03/16 1547    Visit Number 6   Number of Visits 12   Authorization Type [employer]   Authorization Time Period 03-28-16   PT Start Time 2706   PT Stop Time 1630   PT Time Calculation (min) 45 min      Past Medical History:  Diagnosis Date  . Abnormal EKG   . Abnormal pap    s/p cryotherapy  . Diverticulosis   . Hyperlipidemia   . Hypertension   . S/P colonoscopic polypectomy     Past Surgical History:  Procedure Laterality Date  . COLONOSCOPY  03/18/11; 2000   Dr. Amedeo Plenty; diverticulosis in sigmoid colon  . crysurgery  age 48   for abnormal pap  . LAMINECTOMY  03/2002    There were no vitals filed for this visit.      Subjective Assessment - 04/03/16 1548    Subjective Reports been doing exercises. Reports about 75% improvement overall maybe more in the right but stil varies.  Feels ionto really helped.   Currently in Pain? Yes   Pain Score 7   R 3/10   Pain Location Ankle   Pain Orientation Left   Pain Descriptors / Indicators Aching   Pain Type Chronic pain            [facility] PT Assessment - 04/03/16 0001      Observation/Other Assessments   Focus on Therapeutic Outcomes (FOTO)  40%, limitation 60%     AROM   Right Ankle Dorsiflexion 9   Right Ankle Plantar Flexion 50   Right Ankle Inversion 25   Right Ankle Eversion 20   Left Ankle Dorsiflexion 3   Left Ankle Plantar Flexion 55   Left Ankle Inversion 40   Left Ankle Eversion 25     Strength   Right Ankle Dorsiflexion 3+/5   Right Ankle Plantar Flexion 4/5   Left Ankle Dorsiflexion 3+/5   Left Ankle Plantar Flexion 4/5     Ambulation/Gait   Gait Comments Pt continues to demonstrate antalgic gait.                     [facility] Adult PT Treatment/Exercise - 04/03/16 0001      Exercises   Exercises Ankle     Iontophoresis   Type of Iontophoresis Dexamethasone   Location left achilles tendon and right   Dose 1cc  each patch   Time patch to be worn for 4-6 hours and removed. pt given handout     Ankle Exercises: Stretches   Gastroc Stretch 2 reps;30 seconds  20 seconds 2nd rep   Gastroc Stretch Limitations standing, also performed seated and added to HEP     Ankle Exercises: Aerobic   Stationary Bike Nustep Level 2 x 5 minutes legs only x 5:30     Ankle Exercises: Seated   Other Seated Ankle Exercises 4 way yellow band x 15 reps moderate vc's inititally                PT Education - 04/03/16 1645    Education provided Yes   Education Details corrected 4 way ankle exercises/provided pictures, added seated gastroc  stretch   Person(s) Educated Patient   Methods Explanation;Demonstration;Verbal cues;Handout   Comprehension Verbalized understanding;Returned demonstration          PT Short Term Goals - 02/15/16 1736      PT SHORT TERM GOAL #1   Title STG=LTG           PT Long Term Goals - 04/03/16 1646      PT LONG TERM GOAL #1   Title "Pt will be independent with advanced HEP. Target date 03-28-16   Baseline needs cuing for some of exercises   Time 6   Period Weeks   Status Partially Met     PT LONG TERM GOAL #2   Title "Pt will tolerate standing and walking for 2 hours without increased pain in achilles in order to return to PLOF and work. Target date 03-28-16   Baseline reports able to stand 2 hours but hasn't tried that much walking   Status Partially Met     PT LONG TERM GOAL #3   Title "FOTO will improve from  74% limitation  to  46% limitation    indicating improved functional mobility Target date 03-28-16   Baseline improved to 60% limitation   Time 6    Period Weeks   Status Not Met     PT LONG TERM GOAL #4   Title Pt will be able to rise from sitting/driving with pain level equal or < 3/10 or less Target Date 03-28-16   Baseline pain can still get up to 7/10   Time 6   Period Weeks   Status Not Met     PT LONG TERM GOAL #5   Title Pt will be able to return to consistent walking for exercises for 30 minutes 5 x a week target Date 03-28-16   Time 6   Period Weeks   Status Not Met               Plan - 04/03/16 1652    Clinical Impression Statement Pt reports pain overall 70% better. Pt continues to demonstrate an antalgic gait and can only tolerate streches for short durations. Pt reports doing her exercises at home and asked that today be her last session due to financial reasons. Updated and reviewed HEP and discussed overall progression. Recommended patient call our office if she felt the ionto really helped again and we could see her for 1 more visit just for ionto. Plan to DC patient if no call back in 30 days.    Rehab Potential Good   PT Frequency One time visit   PT Duration 4 weeks   PT Treatment/Interventions Iontophoresis 22m/ml Dexamethasone   PT Next Visit Plan Ionto if patient returns, otherwise DC   Consulted and Agree with Plan of Care Patient      Patient will benefit from skilled therapeutic intervention in order to improve the following deficits and impairments:  Difficulty walking, Obesity, Pain, Postural dysfunction, Increased fascial restricitons, Hypomobility, Decreased strength, Decreased mobility, Decreased range of motion, Decreased activity tolerance  Visit Diagnosis: Pain in left foot  Pain in right foot  Stiffness of left ankle, not elsewhere classified  Difficulty in walking, not elsewhere classified  Stiffness of right ankle, not elsewhere classified     Problem List Patient Active Problem List   Diagnosis Date Noted  . Hypertension 08/06/2010  . Hyperlipidemia LDL goal <100  08/06/2010  . Obesity (BMI 30-39.9) 08/06/2010    Amber Church 04/03/2016, 4:55 PM  G code Discharge   Mobility 04-03-16 Goal 47%  CK Discharge 60%  CL  [facility] [address] [place], [place], [address] Phone: [phone]   Fax:  [phone]  Name: Amber Church MRN: [id number] Date of Birth: [birth year]   PHYSICAL THERAPY DISCHARGE SUMMARY  Visits from Start of Care: 6  Current functional level related to goals / functional outcomes: As above   Remaining deficits: Still with pain from bil achilles tendonopathy but 70% improved    Education / Equipment: HEP and iontophoresis Plan: Patient agrees to discharge.  Patient goals were partially met. Patient is being discharged due to the patient's request.  ?????    Pt requests DC due to high deductible and pt feels like she is slowly but surely improving.  She will continue exercises and hopefull pain will resolve.  She will return to Dr. Red[employer] if she does not keep improving.  Pt was contacted via phone call.   Voncille Lo, PT Certified Exercise Expert for the Aging Adult  04/11/16 11:08 AM Phone: [phone] Fax: [phone]

## 2016-05-06 ENCOUNTER — Encounter: Payer: Self-pay | Admitting: Family Medicine

## 2016-05-06 ENCOUNTER — Ambulatory Visit (INDEPENDENT_AMBULATORY_CARE_PROVIDER_SITE_OTHER): Payer: 59 | Admitting: Family Medicine

## 2016-05-06 VITALS — BP 122/80 | HR 68 | Wt 208.0 lb

## 2016-05-06 DIAGNOSIS — M7750 Other enthesopathy of unspecified foot: Secondary | ICD-10-CM | POA: Diagnosis not present

## 2016-05-06 DIAGNOSIS — M7661 Achilles tendinitis, right leg: Secondary | ICD-10-CM | POA: Diagnosis not present

## 2016-05-06 DIAGNOSIS — M7662 Achilles tendinitis, left leg: Secondary | ICD-10-CM | POA: Diagnosis not present

## 2016-05-06 MED ORDER — DICLOFENAC SODIUM 75 MG PO TBEC: 75.0000 mg | DELAYED_RELEASE_TABLET | Freq: Two times a day (BID) | ORAL | 0 refills | Status: DC

## 2016-05-06 NOTE — Progress Notes (Signed)
   Subjective:    Patient ID: Amber Church, female    DOB: 07/22/1955, 61 y.o.   MRN: [id number]  HPI She is here for a recheck. She continues to have difficulty with bilateral heel pain. She has been using an anti-inflammatory and has also been to physical therapy to help with this. She continues to have difficulty and it is interfering with her normal walking pattern.   Review of Systems     Objective:   Physical Exam Alert and in no distress. Exquisitely tender to palpation over the insertion of the Achilles tendon and in the retrocalcaneal area bilaterally.       Assessment & Plan:  Achilles tendinitis of both lower extremities - Plan: Ambulatory referral to Orthopedic Surgery, diclofenac (VOLTAREN) 75 MG EC tablet  Retrocalcaneal bursitis (back of heel), unspecified laterality - Plan: Ambulatory referral to Orthopedic Surgery, diclofenac (VOLTAREN) 75 MG EC tablet  It's time for another opinion if she has not responded to conservative therapy I will also switch her to Voltaren. She has been on Celebrex without much success.

## 2016-05-13 ENCOUNTER — Ambulatory Visit (INDEPENDENT_AMBULATORY_CARE_PROVIDER_SITE_OTHER): Payer: 59 | Admitting: Orthopedic Surgery

## 2016-05-13 ENCOUNTER — Encounter (INDEPENDENT_AMBULATORY_CARE_PROVIDER_SITE_OTHER): Payer: Self-pay | Admitting: Orthopedic Surgery

## 2016-05-13 VITALS — Ht 64.0 in | Wt 208.0 lb

## 2016-05-13 DIAGNOSIS — M7662 Achilles tendinitis, left leg: Secondary | ICD-10-CM

## 2016-05-13 DIAGNOSIS — M6702 Short Achilles tendon (acquired), left ankle: Secondary | ICD-10-CM | POA: Diagnosis not present

## 2016-05-13 DIAGNOSIS — M6701 Short Achilles tendon (acquired), right ankle: Secondary | ICD-10-CM

## 2016-05-13 DIAGNOSIS — M7661 Achilles tendinitis, right leg: Secondary | ICD-10-CM | POA: Insufficient documentation

## 2016-05-13 MED ORDER — LIDOCAINE HCL 1 % IJ SOLN
2.0000 mL | INTRAMUSCULAR | Status: AC | PRN
  Administered 2016-05-13: 2 mL

## 2016-05-13 MED ORDER — METHYLPREDNISOLONE ACETATE 40 MG/ML IJ SUSP
40.0000 mg | INTRAMUSCULAR | Status: AC | PRN
  Administered 2016-05-13: 40 mg via INTRA_ARTICULAR

## 2016-05-13 NOTE — Progress Notes (Signed)
Office Visit Note   Patient: Amber Church           Date of Birth: 1955-08-03           MRN: [id number] Visit Date: 05/13/2016              Requested by: Denita Lung, MD [address] [place], [place] [address] PCP: Denita Lung, MD  Chief Complaint  Patient presents with  . Right Foot - Pain  . Left Foot - Pain      HPI: Patient is a 61 year old woman who was had chronic plantar fasciitis bilaterally she states that after injections this has been she states she now has Achilles insertional tendinitis worse on the left than the right. She has used anti-inflammatories including diclofenac which she states makes her feel bad but does help with the pain. She states that she is currently undergoing physical therapy for stretching she is using balance shoes and states that she cannot walk without her shoes on. She states she's had to decrease her normal activities of daily living due to pain.  Assessment & Plan: Visit Diagnoses:  1. Achilles tendonitis, bilateral   2. Achilles tendon contracture, bilateral     Plan: The insertion of the Achilles tendon was injected in the retrocalcaneal bursa bilaterally. She tolerated this well she is given instructions for heel cord stretching she will not do any dynamic exercises walking is fine follow-up in 4 weeks. Patient may require a gastrocnemius recession.  Follow-Up Instructions: Return in about 4 weeks (around 06/10/2016).   Ortho Exam  Patient is alert, oriented, no adenopathy, well-dressed, normal affect, normal respiratory effort. She does have antalgic gait she has good pulses bilaterally. She has significant heel cord contracture worse on the left than the right with dorsiflexion 20 short of neutral on the left with the knee extended and dorsiflexion only to neutral the right with the knee extended. She has no nodular changes of the Achilles she is point tender palpation at the insertion of the Achilles  bilaterally.  Imaging: No results found.  Labs: No results found for: HGBA1C, ESRSEDRATE, CRP, LABURIC, REPTSTATUS, GRAMSTAIN, CULT, LABORGA  Orders:  No orders of the defined types were placed in this encounter.  No orders of the defined types were placed in this encounter.    Procedures: Medium Joint Inj Date/Time: 05/13/2016 2:27 PM Performed by: DUDA, MARCUS V Authorized by: Newt Minion   Consent Given by:  Patient Site marked: the procedure site was marked   Timeout: prior to procedure the correct patient, procedure, and site was verified   Indications:  Pain and diagnostic evaluation Location:  Ankle Site:  L ankle Prep: patient was prepped and draped in usual sterile fashion   Needle Size:  22 G Needle Length:  1.5 inches Approach:  Posterior Ultrasound Guided: No   Fluoroscopic Guidance: No   Medications:  2 mL lidocaine 1 %; 40 mg methylPREDNISolone acetate 40 MG/ML Aspiration Attempted: No   Patient tolerance:  Patient tolerated the procedure well with no immediate complications Medium Joint Inj Date/Time: 05/13/2016 2:27 PM Performed by: DUDA, MARCUS V Authorized by: Newt Minion   Consent Given by:  Patient Site marked: the procedure site was marked   Timeout: prior to procedure the correct patient, procedure, and site was verified   Indications:  Pain and diagnostic evaluation Location:  Ankle Site:  R ankle Prep: patient was prepped and draped in usual sterile fashion   Needle Size:  22 G Needle Length:  1.5 inches Approach:  Posterior Ultrasound Guided: No   Fluoroscopic Guidance: No   Medications:  2 mL lidocaine 1 %; 40 mg methylPREDNISolone acetate 40 MG/ML Aspiration Attempted: No   Patient tolerance:  Patient tolerated the procedure well with no immediate complications    Clinical Data: No additional findings.  ROS:  All other systems negative, except as noted in the HPI. Review of Systems  Objective: Vital Signs: Ht 5\' 4"  (1.626  m)   Wt 208 lb (94.3 kg)   BMI 35.70 kg/m   Specialty Comments:  No specialty comments available.  PMFS History: Patient Active Problem List   Diagnosis Date Noted  . Achilles tendonitis, bilateral 05/13/2016  . Achilles tendon contracture, bilateral 05/13/2016  . Hypertension 08/06/2010  . Hyperlipidemia LDL goal <100 08/06/2010  . Obesity (BMI 30-39.9) 08/06/2010   Past Medical History:  Diagnosis Date  . Abnormal EKG   . Abnormal pap    s/p cryotherapy  . Diverticulosis   . Hyperlipidemia   . Hypertension   . S/P colonoscopic polypectomy     Family History  Problem Relation Age of Onset  . Alzheimer's disease Mother   . Hyperlipidemia Mother   . Other Father     died of gunshot wound  . Osteoporosis Sister   . Osteoporosis Sister   . ALS Maternal Aunt   . ALS Maternal Uncle   . Diabetes Paternal Aunt   . Cancer Maternal Grandfather     lung cancer  . Cancer Paternal Grandmother     ?type  . Heart disease Neg Hx     Past Surgical History:  Procedure Laterality Date  . COLONOSCOPY  03/18/11; 2000   Dr. Amedeo Plenty; diverticulosis in sigmoid colon  . crysurgery  age 66   for abnormal pap  . 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 Smoking status: Never Smoker  . Smokeless tobacco: Never Used  . Alcohol use Yes     Comment: glass of wine 1-2 times per week.  . Drug use: No  . Sexual activity: Yes    Partners: Male     Comment: postmenopausal

## 2016-05-15 ENCOUNTER — Telehealth (INDEPENDENT_AMBULATORY_CARE_PROVIDER_SITE_OTHER): Payer: Self-pay

## 2016-05-15 NOTE — Telephone Encounter (Signed)
Pt emailed the office with questions about her PF and the injections that were given on Monday and having the pain resolve on the right side but on the left. Called and lm on vm for pt to advise that she should give it a few more days and check in on Monday 1 week from the injection at that time she should have received max benefit.

## 2016-05-17 ENCOUNTER — Other Ambulatory Visit: Payer: Self-pay | Admitting: Family Medicine

## 2016-05-17 DIAGNOSIS — M7750 Other enthesopathy of unspecified foot: Secondary | ICD-10-CM

## 2016-05-17 DIAGNOSIS — M7662 Achilles tendinitis, left leg: Secondary | ICD-10-CM

## 2016-05-17 DIAGNOSIS — M7661 Achilles tendinitis, right leg: Secondary | ICD-10-CM

## 2016-05-17 NOTE — Telephone Encounter (Signed)
Is this okay to refill? 

## 2016-06-10 ENCOUNTER — Ambulatory Visit (INDEPENDENT_AMBULATORY_CARE_PROVIDER_SITE_OTHER): Payer: 59 | Admitting: Orthopedic Surgery

## 2016-06-13 ENCOUNTER — Other Ambulatory Visit: Payer: Self-pay | Admitting: Family Medicine

## 2016-06-13 DIAGNOSIS — M7662 Achilles tendinitis, left leg: Secondary | ICD-10-CM

## 2016-06-13 DIAGNOSIS — M7750 Other enthesopathy of unspecified foot: Secondary | ICD-10-CM

## 2016-06-13 DIAGNOSIS — M7661 Achilles tendinitis, right leg: Secondary | ICD-10-CM

## 2016-06-13 NOTE — Telephone Encounter (Signed)
Is this okay to refill? 

## 2016-06-14 ENCOUNTER — Telehealth: Payer: Self-pay | Admitting: Family Medicine

## 2016-06-14 DIAGNOSIS — M7662 Achilles tendinitis, left leg: Secondary | ICD-10-CM

## 2016-06-14 DIAGNOSIS — M7661 Achilles tendinitis, right leg: Secondary | ICD-10-CM

## 2016-06-14 DIAGNOSIS — M7750 Other enthesopathy of unspecified foot: Secondary | ICD-10-CM

## 2016-06-14 MED ORDER — DICLOFENAC SODIUM 75 MG PO TBEC: 75.0000 mg | DELAYED_RELEASE_TABLET | Freq: Two times a day (BID) | ORAL | 0 refills | Status: DC

## 2016-06-14 NOTE — Telephone Encounter (Signed)
Left message for pt med called in

## 2016-06-14 NOTE — Telephone Encounter (Signed)
Pt called and requested refill on voltaren. I did inform her it was denied yesterday. She states that she is really having a lot of issues and doesn't see specialist until the 12th. She is hoping under those circumstances you will reconsider. Please advise pt.

## 2016-06-14 NOTE — Telephone Encounter (Signed)
Let her know that I called it in 

## 2016-06-17 ENCOUNTER — Telehealth: Payer: Self-pay | Admitting: Family Medicine

## 2016-06-17 DIAGNOSIS — M7662 Achilles tendinitis, left leg: Secondary | ICD-10-CM

## 2016-06-17 DIAGNOSIS — M7661 Achilles tendinitis, right leg: Secondary | ICD-10-CM

## 2016-06-17 DIAGNOSIS — M7750 Other enthesopathy of unspecified foot: Secondary | ICD-10-CM

## 2016-06-17 MED ORDER — DICLOFENAC SODIUM 75 MG PO TBEC: 75.0000 mg | DELAYED_RELEASE_TABLET | Freq: Two times a day (BID) | ORAL | 0 refills | Status: DC

## 2016-06-17 NOTE — Telephone Encounter (Signed)
Pt made aware 90 day supply of voltaren tablets not appropriate. She request [place] cancel script at [facility] and send 30 day supply to [facility].  Called in 30 day supply to [facility] and cancelled script at [facility].  Amber Church

## 2016-06-17 NOTE — Telephone Encounter (Signed)
90 day course at this point is not appropriate.

## 2016-06-17 NOTE — Telephone Encounter (Signed)
[facility] wants to know if ok for 90 days supply for better adherence.  Please advise pt when done [phone]

## 2016-06-18 ENCOUNTER — Encounter (INDEPENDENT_AMBULATORY_CARE_PROVIDER_SITE_OTHER): Payer: Self-pay | Admitting: Orthopedic Surgery

## 2016-06-18 ENCOUNTER — Ambulatory Visit (INDEPENDENT_AMBULATORY_CARE_PROVIDER_SITE_OTHER): Payer: 59 | Admitting: Orthopedic Surgery

## 2016-06-18 VITALS — Ht 64.0 in | Wt 208.0 lb

## 2016-06-18 DIAGNOSIS — M6702 Short Achilles tendon (acquired), left ankle: Secondary | ICD-10-CM | POA: Diagnosis not present

## 2016-06-18 DIAGNOSIS — M6701 Short Achilles tendon (acquired), right ankle: Secondary | ICD-10-CM | POA: Diagnosis not present

## 2016-06-18 DIAGNOSIS — M7661 Achilles tendinitis, right leg: Secondary | ICD-10-CM

## 2016-06-18 DIAGNOSIS — M7662 Achilles tendinitis, left leg: Secondary | ICD-10-CM

## 2016-06-18 NOTE — Progress Notes (Signed)
Office Visit Note   Patient: Amber Church           Date of Birth: 1955/07/27           MRN: [id number] Visit Date: 06/18/2016              Requested by: Denita Lung, MD [address] [place], [place] [address] PCP: Denita Lung, MD  Chief Complaint  Patient presents with  . Left Foot - Follow-up    Bilateral achilles tendonitis s/p injection 05/13/16  . Right Foot - Follow-up      HPI: Patient is a 61 year old woman who is status post injection for bilateral Achilles tendinitis. She states the left injection only worked about a week the right injection worked longer she states she's been doing heel cord stretching she states he's been taking both Celebrex and in both without relief. She states she has GI upset with Santiago Glad. Discussed the importance of not taking the Voltaren with the Celebrex. Patient states she spends a lot of time walking at work and is on her feet all the time.  Assessment & Plan: Visit Diagnoses:  1. Achilles tendon contracture, bilateral   2. Achilles tendonitis, bilateral     Plan: Discussed that due to the fact that she is failed conservative care including anti-inflammatories and instructions we could proceed with gastrocnemius recessions bilaterally. Risks and benefits were discussed including infection neurovascular injury persistent pain and nonhealing of the wound DVT need for additional surgery. Patient states she understands she will call us when she is ready to schedule surgery some time in several months.  Follow-Up Instructions: Return if symptoms worsen or fail to improve.   Ortho Exam  Patient is alert, oriented, no adenopathy, well-dressed, normal affect, normal respiratory effort. Examination patient has an antalgic gait. She has significant heel cord contracture with dorsiflexion about 10 short of neutral bilaterally. She has good pulses. She has no nodular changes to the Achilles there is tenderness to palpation at the  insertion dorsiflexion of the ankle reproduces Achilles pain. The plantar fascia is minimally tender to palpation.  Imaging: No results found.  Labs: No results found for: HGBA1C, ESRSEDRATE, CRP, LABURIC, REPTSTATUS, GRAMSTAIN, CULT, LABORGA  Orders:  No orders of the defined types were placed in this encounter.  No orders of the defined types were placed in this encounter.    Procedures: No procedures performed  Clinical Data: No additional findings.  ROS:  All other systems negative, except as noted in the HPI. Review of Systems  Objective: Vital Signs: Ht 5\' 4"  (1.626 m)   Wt 208 lb (94.3 kg)   BMI 35.70 kg/m   Specialty Comments:  No specialty comments available.  PMFS History: Patient Active Problem List   Diagnosis Date Noted  . Achilles tendonitis, bilateral 05/13/2016  . Achilles tendon contracture, bilateral 05/13/2016  . Hypertension 08/06/2010  . Hyperlipidemia LDL goal <100 08/06/2010  . Obesity (BMI 30-39.9) 08/06/2010   Past Medical History:  Diagnosis Date  . Abnormal EKG   . Abnormal pap    s/p cryotherapy  . Diverticulosis   . Hyperlipidemia   . Hypertension   . S/P colonoscopic polypectomy     Family History  Problem Relation Age of Onset  . Alzheimer's disease Mother   . Hyperlipidemia Mother   . Other Father        died of gunshot wound  . Osteoporosis Sister   . Osteoporosis Sister   . ALS Maternal Aunt   .  ALS Maternal Uncle   . Diabetes Paternal Aunt   . Cancer Maternal Grandfather        lung cancer  . Cancer Paternal Grandmother        ?type  . Heart disease Neg Hx     Past Surgical History:  Procedure Laterality Date  . COLONOSCOPY  03/18/11; 2000   Dr. Amedeo Plenty; diverticulosis in sigmoid colon  . crysurgery  age 57   for abnormal pap  . 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 Smoking status: Never Smoker  . Smokeless  tobacco: Never Used  . Alcohol use Yes     Comment: glass of wine 1-2 times per week.  . Drug use: No  . Sexual activity: Yes    Partners: Male     Comment: postmenopausal

## 2016-06-24 ENCOUNTER — Telehealth (INDEPENDENT_AMBULATORY_CARE_PROVIDER_SITE_OTHER): Payer: Self-pay | Admitting: Orthopedic Surgery

## 2016-06-24 NOTE — Telephone Encounter (Signed)
Amber Church called and stated that she was ready to go ahead and schedule surgery for the near future.  Can you please fill out a surgery sheet for her.  Thank  You!

## 2016-06-25 NOTE — Telephone Encounter (Signed)
Blue sheet completed.

## 2016-07-09 ENCOUNTER — Telehealth (INDEPENDENT_AMBULATORY_CARE_PROVIDER_SITE_OTHER): Payer: Self-pay

## 2016-07-09 ENCOUNTER — Encounter: Payer: Self-pay | Admitting: Orthopedic Surgery

## 2016-07-09 DIAGNOSIS — M6702 Short Achilles tendon (acquired), left ankle: Secondary | ICD-10-CM | POA: Diagnosis not present

## 2016-07-09 DIAGNOSIS — M6701 Short Achilles tendon (acquired), right ankle: Secondary | ICD-10-CM | POA: Diagnosis not present

## 2016-07-09 NOTE — Telephone Encounter (Signed)
Called pt to advise that the pharm called and had a question about the mg strength. Pharm was called advised hydrocodone 5/325mg  . Pt advised that this should be ready for pick up with in the hour and to call with any other questions.

## 2016-07-09 NOTE — Telephone Encounter (Signed)
Case worker with [employer] calling to find out if the pt had surgery today and what the procedure was. To call back [phone]

## 2016-07-09 NOTE — Telephone Encounter (Signed)
Patient called stated she had surgery on both legs by Dr Sharol Given this morning at the surgical center. She took her pain medication to pharmacy that she uses [facility] in [place]) and Is having a problem getting this filled. She said that the pharmacy will not fill it. Is asking for you to call pharmacy to see if you can get problem resolved because she is having a lot of pain.  Pharmacy phone number is [phone]

## 2016-07-09 NOTE — Telephone Encounter (Signed)
Yes, patient had bilateral gastrocnemius recessions today.

## 2016-07-09 NOTE — Telephone Encounter (Signed)
Did you do bilat gastroc on this pt today?

## 2016-07-09 NOTE — Telephone Encounter (Signed)
I called and advised of below. They will call with additional questions.

## 2016-07-19 ENCOUNTER — Encounter (INDEPENDENT_AMBULATORY_CARE_PROVIDER_SITE_OTHER): Payer: Self-pay | Admitting: Family

## 2016-07-19 ENCOUNTER — Ambulatory Visit (INDEPENDENT_AMBULATORY_CARE_PROVIDER_SITE_OTHER): Payer: 59 | Admitting: Family

## 2016-07-19 DIAGNOSIS — M7661 Achilles tendinitis, right leg: Secondary | ICD-10-CM

## 2016-07-19 DIAGNOSIS — M7662 Achilles tendinitis, left leg: Secondary | ICD-10-CM

## 2016-07-19 DIAGNOSIS — M6701 Short Achilles tendon (acquired), right ankle: Secondary | ICD-10-CM

## 2016-07-19 DIAGNOSIS — M6702 Short Achilles tendon (acquired), left ankle: Secondary | ICD-10-CM

## 2016-07-19 NOTE — Progress Notes (Signed)
   Post-Op Visit Note   Patient: Amber Church           Date of Birth: [birth year]           MRN: [id number] Visit Date: 07/19/2016 PCP: Denita Lung, MD  Chief Complaint:  Chief Complaint  Patient presents with  . Right Leg - Follow-up  . Left Leg - Follow-up    HPI:  The patient is a 61 year old woman who presents today one week status post bilateral Achilles Z-lengthening. She has been full weightbearing has returned to regular activities. Requests return to work next week. Wonders what exercises and stretches she can be working on.    Ortho Exam Incisions are well approximated and healing well bilaterally. There is no gaping no erythema no drainage no odor no sign of infection.  Visit Diagnoses:  1. Achilles tendon contracture, bilateral   2. Achilles tendonitis, bilateral     Plan: Teachers harvested bilaterally without incident. She'll cleanse these daily apply dry dressings. Given a note that she may return to work on July 17 without restrictions. She'll follow-up in office once more in 2 weeks.  Follow-Up Instructions: Return in about 2 weeks (around 08/02/2016).   Imaging: No results found.  Orders:  No orders of the defined types were placed in this encounter.  No orders of the defined types were placed in this encounter.    PMFS History: Patient Active Problem List   Diagnosis Date Noted  . Achilles tendonitis, bilateral 05/13/2016  . Achilles tendon contracture, bilateral 05/13/2016  . Hypertension 08/06/2010  . Hyperlipidemia LDL goal <100 08/06/2010  . Obesity (BMI 30-39.9) 08/06/2010   Past Medical History:  Diagnosis Date  . Abnormal EKG   . Abnormal pap    s/p cryotherapy  . Diverticulosis   . Hyperlipidemia   . Hypertension   . S/P colonoscopic polypectomy     Family History  Problem Relation Age of Onset  . Alzheimer's disease Mother   . Hyperlipidemia Mother   . Other Father        died of gunshot wound  . Osteoporosis  Sister   . Osteoporosis Sister   . ALS Maternal Aunt   . ALS Maternal Uncle   . Diabetes Paternal Aunt   . Cancer Maternal Grandfather        lung cancer  . Cancer Paternal Grandmother        ?type  . Heart disease Neg Hx     Past Surgical History:  Procedure Laterality Date  . COLONOSCOPY  03/18/11; 2000   Dr. Amedeo Plenty; diverticulosis in sigmoid colon  . crysurgery  age 19   for abnormal pap  . 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 Smoking status: Never Smoker  . Smokeless tobacco: Never Used  . Alcohol use Yes     Comment: glass of wine 1-2 times per week.  . Drug use: No  . Sexual activity: Yes    Partners: Male     Comment: postmenopausal

## 2016-08-02 ENCOUNTER — Ambulatory Visit (INDEPENDENT_AMBULATORY_CARE_PROVIDER_SITE_OTHER): Payer: 59 | Admitting: Orthopedic Surgery

## 2016-08-05 ENCOUNTER — Ambulatory Visit (INDEPENDENT_AMBULATORY_CARE_PROVIDER_SITE_OTHER): Payer: 59 | Admitting: Orthopedic Surgery

## 2016-08-05 ENCOUNTER — Encounter (INDEPENDENT_AMBULATORY_CARE_PROVIDER_SITE_OTHER): Payer: Self-pay | Admitting: Orthopedic Surgery

## 2016-08-05 DIAGNOSIS — M6701 Short Achilles tendon (acquired), right ankle: Secondary | ICD-10-CM

## 2016-08-05 DIAGNOSIS — M6702 Short Achilles tendon (acquired), left ankle: Secondary | ICD-10-CM

## 2016-08-05 MED ORDER — HYDROCODONE-ACETAMINOPHEN 5-325 MG PO TABS: 1.0000 | ORAL_TABLET | Freq: Two times a day (BID) | ORAL | 0 refills | Status: DC | PRN

## 2016-08-05 NOTE — Progress Notes (Signed)
Office Visit Note   Patient: Amber Church           Date of Birth: July 12, 1955           MRN: [id number] Visit Date: 08/05/2016              Requested by: Denita Lung, MD [address] [place], [place] [address] PCP: Denita Lung, MD  Chief Complaint  Patient presents with  . Left Ankle - Routine Post Op  . Right Ankle - Routine Post Op      HPI: Patient presents 4 weeks status post bilateral gastrocnemius recessions. Examination the incisions are well-healed she is excellent dorsiflexion with about 20 dorsiflexion past neutral bilaterally.  Assessment & Plan: Visit Diagnoses:  1. Achilles tendon contracture, bilateral     Plan: Recommended continue heel cord stretching recommended scar massage daily  Follow-Up Instructions: Return in about 4 weeks (around 09/02/2016).   Ortho Exam  Patient is alert, oriented, no adenopathy, well-dressed, normal affect, normal respiratory effort. Examination the incisions are well-healed there is no redness no cellulitis no signs of infection. She has excellent range of motion she has a little bit of scar tissue and just have some tenderness to palpation.  Imaging: No results found.  Labs: No results found for: HGBA1C, ESRSEDRATE, CRP, LABURIC, REPTSTATUS, GRAMSTAIN, CULT, LABORGA  Orders:  No orders of the defined types were placed in this encounter.  No orders of the defined types were placed in this encounter.    Procedures: No procedures performed  Clinical Data: No additional findings.  ROS:  All other systems negative, except as noted in the HPI. Review of Systems  Objective: Vital Signs: There were no vitals taken for this visit.  Specialty Comments:  No specialty comments available.  PMFS History: Patient Active Problem List   Diagnosis Date Noted  . Achilles tendonitis, bilateral 05/13/2016  . Achilles tendon contracture, bilateral 05/13/2016  . Hypertension 08/06/2010  .  Hyperlipidemia LDL goal <100 08/06/2010  . Obesity (BMI 30-39.9) 08/06/2010   Past Medical History:  Diagnosis Date  . Abnormal EKG   . Abnormal pap    s/p cryotherapy  . Diverticulosis   . Hyperlipidemia   . Hypertension   . S/P colonoscopic polypectomy     Family History  Problem Relation Age of Onset  . Alzheimer's disease Mother   . Hyperlipidemia Mother   . Other Father        died of gunshot wound  . Osteoporosis Sister   . Osteoporosis Sister   . ALS Maternal Aunt   . ALS Maternal Uncle   . Diabetes Paternal Aunt   . Cancer Maternal Grandfather        lung cancer  . Cancer Paternal Grandmother        ?type  . Heart disease Neg Hx     Past Surgical History:  Procedure Laterality Date  . COLONOSCOPY  03/18/11; 2000   Dr. Amedeo Plenty; diverticulosis in sigmoid colon  . crysurgery  age 24   for abnormal pap  . 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 Smoking status: Never Smoker  . Smokeless tobacco: Never Used  . Alcohol use Yes     Comment: glass of wine 1-2 times per week.  . Drug use: No  . Sexual activity: Yes    Partners: Male     Comment: postmenopausal

## 2016-08-06 ENCOUNTER — Telehealth (INDEPENDENT_AMBULATORY_CARE_PROVIDER_SITE_OTHER): Payer: Self-pay | Admitting: Radiology

## 2016-08-06 NOTE — Telephone Encounter (Signed)
I called and spoke with Selena from [facility] in [place]. They did get prescription already to go through for the patient, Amber Church is now participating with new CDC guideline requiring dx code. They used dx code from prescription less than 30 days ago for same medication.

## 2016-09-02 ENCOUNTER — Encounter (INDEPENDENT_AMBULATORY_CARE_PROVIDER_SITE_OTHER): Payer: Self-pay | Admitting: Orthopedic Surgery

## 2016-09-02 ENCOUNTER — Ambulatory Visit (INDEPENDENT_AMBULATORY_CARE_PROVIDER_SITE_OTHER): Payer: 59 | Admitting: Orthopedic Surgery

## 2016-09-02 DIAGNOSIS — M6701 Short Achilles tendon (acquired), right ankle: Secondary | ICD-10-CM

## 2016-09-02 DIAGNOSIS — M6702 Short Achilles tendon (acquired), left ankle: Secondary | ICD-10-CM

## 2016-09-02 MED ORDER — HYDROCODONE-ACETAMINOPHEN 5-325 MG PO TABS: 1.0000 | ORAL_TABLET | Freq: Two times a day (BID) | ORAL | 0 refills | Status: DC | PRN

## 2016-09-02 MED ORDER — DICLOFENAC SODIUM 1 % TD GEL: 2.0000 g | Freq: Four times a day (QID) | TRANSDERMAL | 3 refills | Status: DC | PRN

## 2016-09-02 NOTE — Progress Notes (Signed)
Office Visit Note   Patient: Amber Church           Date of Birth: 12-May-1955           MRN: [id number] Visit Date: 09/02/2016              Requested by: Denita Lung, MD [address] [place], [place] [address] PCP: Denita Lung, MD  Chief Complaint  Patient presents with  . Right Leg - Follow-up  . Left Leg - Follow-up      HPI: Patient is status post gastrocnemius recession for insertional Achilles tendinitis. Patient states that her legs feel much better after stretching but then after prolonged sitting for overnight or the stiffness reoccurs.  Assessment & Plan: Visit Diagnoses:  1. Achilles tendon contracture, bilateral     Plan: Continue heel cord stretching a prescription was sent for voltaren gel and a prescription for Vicodin for pain. Continue stretching follow-up in 4 weeks  Follow-Up Instructions: Return in about 4 weeks (around 09/30/2016).   Ortho Exam  Patient is alert, oriented, no adenopathy, well-dressed, normal affect, normal respiratory effort. Patient has a normal gait. She has good pulses. There are no nodular changes to the Achilles she  Imaging: No results found. No images are attached to the encounter.  Labs: No results found for: HGBA1C, ESRSEDRATE, CRP, LABURIC, REPTSTATUS, GRAMSTAIN, CULT, LABORGA  Orders:  No orders of the defined types were placed in this encounter.  Meds ordered this encounter  Medications  . diclofenac sodium (VOLTAREN) 1 % GEL    Sig: Apply 2 g topically 4 (four) times daily as needed.    Dispense:  100 g    Refill:  3     Procedures: No procedures performed  Clinical Data: No additional findings.  ROS:  All other systems negative, except as noted in the HPI. Review of Systems  Objective: Vital Signs: There were no vitals taken for this visit.  Specialty Comments:  No specialty comments available.  PMFS History: Patient Active Problem List   Diagnosis Date Noted  . Achilles  tendonitis, bilateral 05/13/2016  . Achilles tendon contracture, bilateral 05/13/2016  . Hypertension 08/06/2010  . Hyperlipidemia LDL goal <100 08/06/2010  . Obesity (BMI 30-39.9) 08/06/2010   Past Medical History:  Diagnosis Date  . Abnormal EKG   . Abnormal pap    s/p cryotherapy  . Diverticulosis   . Hyperlipidemia   . Hypertension   . S/P colonoscopic polypectomy     Family History  Problem Relation Age of Onset  . Alzheimer's disease Mother   . Hyperlipidemia Mother   . Other Father        died of gunshot wound  . Osteoporosis Sister   . Osteoporosis Sister   . ALS Maternal Aunt   . ALS Maternal Uncle   . Diabetes Paternal Aunt   . Cancer Maternal Grandfather        lung cancer  . Cancer Paternal Grandmother        ?type  . Heart disease Neg Hx     Past Surgical History:  Procedure Laterality Date  . COLONOSCOPY  03/18/11; 2000   Dr. Amedeo Plenty; diverticulosis in sigmoid colon  . crysurgery  age 33   for abnormal pap  . 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 Smoking status: Never Smoker  . Smokeless tobacco: Never Used  .  Alcohol use Yes     Comment: glass of wine 1-2 times per week.  . Drug use: No  . Sexual activity: Yes    Partners: Male     Comment: postmenopausal

## 2016-09-18 ENCOUNTER — Other Ambulatory Visit: Payer: Self-pay | Admitting: Family Medicine

## 2016-09-18 DIAGNOSIS — I10 Essential (primary) hypertension: Secondary | ICD-10-CM

## 2016-09-18 DIAGNOSIS — E785 Hyperlipidemia, unspecified: Secondary | ICD-10-CM

## 2016-09-18 DIAGNOSIS — M1990 Unspecified osteoarthritis, unspecified site: Secondary | ICD-10-CM

## 2016-09-18 NOTE — Telephone Encounter (Signed)
Please advise on Celebrex refill?

## 2016-09-30 ENCOUNTER — Ambulatory Visit (INDEPENDENT_AMBULATORY_CARE_PROVIDER_SITE_OTHER): Payer: 59 | Admitting: Orthopedic Surgery

## 2016-09-30 DIAGNOSIS — M6702 Short Achilles tendon (acquired), left ankle: Secondary | ICD-10-CM

## 2016-09-30 DIAGNOSIS — M6701 Short Achilles tendon (acquired), right ankle: Secondary | ICD-10-CM

## 2016-09-30 MED ORDER — DICLOFENAC SODIUM 1 % TD GEL: 2.0000 g | Freq: Four times a day (QID) | TRANSDERMAL | 3 refills | Status: AC | PRN

## 2016-09-30 MED ORDER — HYDROCODONE-ACETAMINOPHEN 5-325 MG PO TABS: 1.0000 | ORAL_TABLET | Freq: Two times a day (BID) | ORAL | 0 refills | Status: DC | PRN

## 2016-09-30 MED ORDER — DICLOFENAC SODIUM 1 % TD GEL
2.0000 g | Freq: Four times a day (QID) | TRANSDERMAL | 3 refills | Status: DC | PRN
Start: 2016-09-30 — End: 2016-09-30

## 2016-09-30 NOTE — Progress Notes (Signed)
Office Visit Note   Patient: Amber Church           Date of Birth: 1955-09-30           MRN: [id number] Visit Date: 09/30/2016              Requested by: Denita Lung, MD [place], [place] [address] PCP: Denita Lung, MD  No chief complaint on file.     HPI: Patient is a 61 year old woman status post bilateral gastrocnemius recessions for Achilles tendinitis. Patient states that she is doing a lot better than she was before surgery but she still frustrated that she has some pain. She states the Voltaren gel does help.  Assessment & Plan: Visit Diagnoses:  1. Achilles tendon contracture, bilateral     Plan: Recommended continuing with her heel with continue with the Voltaren gel 2-3 times a day  Follow-Up Instructions: Return in about 4 weeks (around 10/28/2016).   Ortho Exam  Patient is alert, oriented, no adenopathy, well-dressed, normal affect, normal respiratory effort. Examination patient has excellent dorsiflexion of both ankles. She is tender more on the right side than the left side this was the opposite preoperatively. There is no redness no cellulitis no signs of infection and the surgical site is nontender to palpation there is no scarring.  Imaging: No results found. No images are attached to the encounter.  Labs: No results found for: HGBA1C, ESRSEDRATE, CRP, LABURIC, REPTSTATUS, GRAMSTAIN, CULT, LABORGA  Orders:  No orders of the defined types were placed in this encounter.  Meds ordered this encounter  Medications  . DISCONTD: diclofenac sodium (VOLTAREN) 1 % GEL    Sig: Apply 2 g topically 4 (four) times daily as needed.    Dispense:  100 g    Refill:  3  . diclofenac sodium (VOLTAREN) 1 % GEL    Sig: Apply 2 g topically 4 (four) times daily as needed.    Dispense:  100 g    Refill:  3  . diclofenac sodium (VOLTAREN) 1 % GEL    Sig: Apply 2 g topically 4 (four) times daily as needed.    Dispense:  100 g    Refill:   3  . HYDROcodone-acetaminophen (NORCO/VICODIN) 5-325 MG tablet    Sig: Take 1 tablet by mouth 2 (two) times daily as needed for moderate pain.    Dispense:  30 tablet    Refill:  0     Procedures: No procedures performed  Clinical Data: No additional findings.  ROS:  All other systems negative, except as noted in the HPI. Review of Systems  Objective: Vital Signs: There were no vitals taken for this visit.  Specialty Comments:  No specialty comments available.  PMFS History: Patient Active Problem List   Diagnosis Date Noted  . Achilles tendonitis, bilateral 05/13/2016  . Achilles tendon contracture, bilateral 05/13/2016  . Hypertension 08/06/2010  . Hyperlipidemia LDL goal <100 08/06/2010  . Obesity (BMI 30-39.9) 08/06/2010   Past Medical History:  Diagnosis Date  . Abnormal EKG   . Abnormal pap    s/p cryotherapy  . Diverticulosis   . Hyperlipidemia   . Hypertension   . S/P colonoscopic polypectomy     Family History  Problem Relation Age of Onset  . Alzheimer's disease Mother   . Hyperlipidemia Mother   . Other Father        died of gunshot wound  . Osteoporosis Sister   . Osteoporosis Sister   .  ALS Maternal Aunt   . ALS Maternal Uncle   . Diabetes Paternal Aunt   . Cancer Maternal Grandfather        lung cancer  . Cancer Paternal Grandmother        ?type  . Heart disease Neg Hx     Past Surgical History:  Procedure Laterality Date  . COLONOSCOPY  03/18/11; 2000   Dr. Amedeo Plenty; diverticulosis in sigmoid colon  . crysurgery  age 29   for abnormal pap  . 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 Smoking status: Never Smoker  . Smokeless tobacco: Never Used  . Alcohol use Yes     Comment: glass of wine 1-2 times per week.  . Drug use: No  . Sexual activity: Yes    Partners: Male     Comment: postmenopausal

## 2016-10-01 ENCOUNTER — Telehealth (INDEPENDENT_AMBULATORY_CARE_PROVIDER_SITE_OTHER): Payer: Self-pay | Admitting: Radiology

## 2016-10-01 NOTE — Telephone Encounter (Signed)
Patient called and said that pharm says she needs a PA for her norco.  I have submitted this online at covermymeds, and will f/u.  Amber Church has advised patient that we are working on this for her.

## 2016-10-02 NOTE — Telephone Encounter (Signed)
Approved on September 25  Request Reference Number: [id number]. NORCO TAB 5-325MG  is approved through 10/01/2017. For further questions, call [phone].  IC patient and LM advising.

## 2016-10-10 ENCOUNTER — Encounter: Payer: Self-pay | Admitting: *Deleted

## 2016-10-31 ENCOUNTER — Other Ambulatory Visit (INDEPENDENT_AMBULATORY_CARE_PROVIDER_SITE_OTHER): Payer: Self-pay | Admitting: Orthopedic Surgery

## 2016-11-14 ENCOUNTER — Encounter (INDEPENDENT_AMBULATORY_CARE_PROVIDER_SITE_OTHER): Payer: Self-pay | Admitting: Orthopedic Surgery

## 2016-11-14 ENCOUNTER — Ambulatory Visit (INDEPENDENT_AMBULATORY_CARE_PROVIDER_SITE_OTHER): Payer: 59 | Admitting: Orthopedic Surgery

## 2016-11-14 DIAGNOSIS — M6701 Short Achilles tendon (acquired), right ankle: Secondary | ICD-10-CM | POA: Diagnosis not present

## 2016-11-14 DIAGNOSIS — M6702 Short Achilles tendon (acquired), left ankle: Secondary | ICD-10-CM | POA: Diagnosis not present

## 2016-11-14 MED ORDER — HYDROCODONE-ACETAMINOPHEN 5-325 MG PO TABS: 1.0000 | ORAL_TABLET | Freq: Two times a day (BID) | ORAL | 0 refills | Status: DC | PRN

## 2016-11-14 NOTE — Progress Notes (Signed)
Office Visit Note   Patient: Amber Church           Date of Birth: 03/28/1955           MRN: [id number] Visit Date: 11/14/2016              Requested by: Denita Lung, MD [address] [place], [place] [address] PCP: Denita Lung, MD  Chief Complaint  Patient presents with  . Right Ankle - Follow-up  . Left Ankle - Follow-up      HPI: Patient presents in follow-up for bilateral insertional Achilles tendinitis.  Patient states that her pain is worse at the end of the day she states she currently has to work an 8-hour shift standing on her feet and she states the last 4 hours are very painful at the insertion of the Achilles.  She states she can hardly get out of the car at the end of the day.  She currently takes Celebrex twice a day for her knee pain.  She states she is taking half of Vicodin twice a day for pain.  Assessment & Plan: Visit Diagnoses:  1. Achilles tendon contracture, bilateral     Plan: We will give her 9/16 inch heel lifts to help unload the Achilles during the day while she is standing.  Continue with heel cord stretching.  Continue with the Celebrex.  Follow-Up Instructions: Return in about 4 weeks (around 12/12/2016).   Ortho Exam  Patient is alert, oriented, no adenopathy, well-dressed, normal affect, normal respiratory effort. Examination patient has good pulses bilaterally.  She has dorsiflexion about 20 degrees past neutral with her knee extended with excellent dorsiflexion of the ankle.  The Achilles is nontender to palpation there is no swelling no nodular changes.  She is point tender to palpation at the insertion of the Achilles.  There is no redness no cellulitis.  Patient has a normal gait.  Imaging: No results found. No images are attached to the encounter.  Labs: No results found for: HGBA1C, ESRSEDRATE, CRP, LABURIC, REPTSTATUS, GRAMSTAIN, CULT, LABORGA  Orders:  No orders of the defined types were placed in this  encounter.  Meds ordered this encounter  Medications  . HYDROcodone-acetaminophen (NORCO/VICODIN) 5-325 MG tablet    Sig: Take 1 tablet 2 (two) times daily as needed by mouth for moderate pain.    Dispense:  20 tablet    Refill:  0     Procedures: No procedures performed  Clinical Data: No additional findings.  ROS:  All other systems negative, except as noted in the HPI. Review of Systems  Objective: Vital Signs: There were no vitals taken for this visit.  Specialty Comments:  No specialty comments available.  PMFS History: Patient Active Problem List   Diagnosis Date Noted  . Achilles tendonitis, bilateral 05/13/2016  . Achilles tendon contracture, bilateral 05/13/2016  . Hypertension 08/06/2010  . Hyperlipidemia LDL goal <100 08/06/2010  . Obesity (BMI 30-39.9) 08/06/2010   Past Medical History:  Diagnosis Date  . Abnormal EKG   . Abnormal pap    s/p cryotherapy  . Diverticulosis   . Hyperlipidemia   . Hypertension   . S/P colonoscopic polypectomy     Family History  Problem Relation Age of Onset  . Alzheimer's disease Mother   . Hyperlipidemia Mother   . Other Father        died of gunshot wound  . Osteoporosis Sister   . Osteoporosis Sister   . ALS Maternal Aunt   .  ALS Maternal Uncle   . Diabetes Paternal Aunt   . Cancer Maternal Grandfather        lung cancer  . Cancer Paternal Grandmother        ?type  . Heart disease Neg Hx     Past Surgical History:  Procedure Laterality Date  . COLONOSCOPY  03/18/11; 2000   Dr. Amedeo Plenty; diverticulosis in sigmoid colon  . crysurgery  age 61   for abnormal pap  . LAMINECTOMY  03/2002   Social History   Occupational History  . Occupation: quality control    Employer: [employer]  Tobacco Use  . Smoking status: Never Smoker  . Smokeless tobacco: Never Used  Substance and Sexual Activity  . Alcohol use: Yes    Comment: glass of wine 1-2 times per week.  . Drug use: No  . Sexual activity: Yes      Partners: Male    Comment: postmenopausal

## 2016-12-03 ENCOUNTER — Telehealth (INDEPENDENT_AMBULATORY_CARE_PROVIDER_SITE_OTHER): Payer: Self-pay | Admitting: Orthopedic Surgery

## 2016-12-03 NOTE — Telephone Encounter (Signed)
Patient called asking for a refill on Hydrocodone. She said she runs out tomorrow. CB # [phone]

## 2016-12-03 NOTE — Telephone Encounter (Signed)
uc- call in refill for celebrex if she needs more, would not take vicodin for the tendonopathy

## 2016-12-04 NOTE — Telephone Encounter (Signed)
Advised of message below, she declined celebrex refill. Advised that narcotics are not to be written in chronic nature by our office. She may want to follow up with PCP, or we are more than happy to see her in the office again for repeat evaluation. She could not come in sooner due to appointment tomorrow.

## 2016-12-13 ENCOUNTER — Telehealth (INDEPENDENT_AMBULATORY_CARE_PROVIDER_SITE_OTHER): Payer: Self-pay | Admitting: Orthopedic Surgery

## 2016-12-13 NOTE — Telephone Encounter (Signed)
Called patient left message to return call concerning appointment 12/16/16

## 2016-12-16 ENCOUNTER — Ambulatory Visit (INDEPENDENT_AMBULATORY_CARE_PROVIDER_SITE_OTHER): Payer: 59 | Admitting: Orthopedic Surgery

## 2016-12-23 ENCOUNTER — Ambulatory Visit (INDEPENDENT_AMBULATORY_CARE_PROVIDER_SITE_OTHER): Payer: 59

## 2016-12-23 ENCOUNTER — Encounter (INDEPENDENT_AMBULATORY_CARE_PROVIDER_SITE_OTHER): Payer: Self-pay | Admitting: Orthopedic Surgery

## 2016-12-23 ENCOUNTER — Ambulatory Visit (INDEPENDENT_AMBULATORY_CARE_PROVIDER_SITE_OTHER): Payer: 59 | Admitting: Orthopedic Surgery

## 2016-12-23 VITALS — Ht 64.0 in | Wt 208.0 lb

## 2016-12-23 DIAGNOSIS — G8929 Other chronic pain: Secondary | ICD-10-CM | POA: Diagnosis not present

## 2016-12-23 DIAGNOSIS — M6701 Short Achilles tendon (acquired), right ankle: Secondary | ICD-10-CM | POA: Diagnosis not present

## 2016-12-23 DIAGNOSIS — L600 Ingrowing nail: Secondary | ICD-10-CM | POA: Insufficient documentation

## 2016-12-23 DIAGNOSIS — M25561 Pain in right knee: Secondary | ICD-10-CM | POA: Diagnosis not present

## 2016-12-23 DIAGNOSIS — M6702 Short Achilles tendon (acquired), left ankle: Secondary | ICD-10-CM

## 2016-12-23 MED ORDER — METHYLPREDNISOLONE ACETATE 40 MG/ML IJ SUSP
40.0000 mg | INTRAMUSCULAR | Status: AC | PRN
  Administered 2016-12-23: 40 mg via INTRA_ARTICULAR

## 2016-12-23 MED ORDER — LIDOCAINE HCL 1 % IJ SOLN
5.0000 mL | INTRAMUSCULAR | Status: AC | PRN
  Administered 2016-12-23: 5 mL

## 2016-12-23 NOTE — Progress Notes (Signed)
Office Visit Note   Patient: Amber Church           Date of Birth: July 21, 1955           MRN: [id number] Visit Date: 12/23/2016              Requested by: Denita Lung, MD [address] [place], [place] [address] PCP: Denita Lung, MD  Chief Complaint  Patient presents with  . Left Foot - Follow-up    bilat insertional achilles tendonitis   . Right Foot - Follow-up    C/o ingrown toenail great toe.   . Right Knee - Pain      HPI: Patient is a 61 year old woman who presents she is status post using felt heel lifts for her insertional Achilles tendinitis bilaterally.  She states that her foot is feeling better.  She states she has been having increasing pain in the right knee along the medial joint line.  She states it feels like bone rubbing on bone.  She complains of stiffness with start up denies any locking or giving way.  Patient also complains of ingrown toenail over the medial border of the right great toe.  Assessment & Plan: Visit Diagnoses:  1. Chronic pain of right knee   2. Achilles tendon contracture, bilateral   3. Ingrowing nail, right great toe     Plan: Recommended using cotton under the nail to elevate the nail and trimming the nail straight across instead of curving and on the sides.  Discussed that if this does not improve conservatively we would need to excise the nail.  There is no signs of infection at this time.  Right knee was injected through the anterior medial portal will reevaluate in 4 weeks to reevaluate the Achilles tendinitis the ingrown right great toenail and the arthritis of the right knee.  Recommended that she could use ibuprofen 600 mg 3 times a day with meals as needed for pain.  Follow-Up Instructions: Return in about 4 weeks (around 01/20/2017).   Ortho Exam  Patient is alert, oriented, no adenopathy, well-dressed, normal affect, normal respiratory effort. On examination patient has a good pulse.  She does have an ingrown  toenail but there is no redness no cellulitis no paronychial infection.  Examination the Achilles tendon this is minimally tender to palpation.  Right knee collaterals and cruciates are stable there is no effusion she is exquisitely tender to palpation over the medial joint line.  There is crepitation with range of motion.  Imaging: Xr Knee 1-2 Views Right  Result Date: 12/23/2016 2 view radiographs of the right knee shows osteophytic bone spurs in the patellofemoral joint as well as bone-on-bone contact medial joint line with periarticular bony spurs medially and laterally.  No images are attached to the encounter.  Labs: No results found for: HGBA1C, ESRSEDRATE, CRP, LABURIC, REPTSTATUS, GRAMSTAIN, CULT, LABORGA  @LABSALLVALUES (HGBA1)@  Body mass index is 35.7 kg/m.  Orders:  Orders Placed This Encounter  Procedures  . XR Knee 1-2 Views Right   No orders of the defined types were placed in this encounter.    Procedures: Large Joint Inj: R knee on 12/23/2016 4:02 PM Indications: pain and diagnostic evaluation Details: 22 G 1.5 in needle, anteromedial approach  Arthrogram: No  Medications: 5 mL lidocaine 1 %; 40 mg methylPREDNISolone acetate 40 MG/ML Outcome: tolerated well, no immediate complications Procedure, treatment alternatives, risks and benefits explained, specific risks discussed. Consent was given by the patient. Immediately prior to  procedure a time out was called to verify the correct patient, procedure, equipment, support staff and site/side marked as required. Patient was prepped and draped in the usual sterile fashion.      Clinical Data: No additional findings.  ROS:  All other systems negative, except as noted in the HPI. Review of Systems  Objective: Vital Signs: Ht 5\' 4"  (1.626 m)   Wt 208 lb (94.3 kg)   BMI 35.70 kg/m   Specialty Comments:  No specialty comments available.  PMFS History: Patient Active Problem List   Diagnosis Date Noted   . Ingrowing nail, right great toe 12/23/2016  . Achilles tendonitis, bilateral 05/13/2016  . Achilles tendon contracture, bilateral 05/13/2016  . Hypertension 08/06/2010  . Hyperlipidemia LDL goal <100 08/06/2010  . Obesity (BMI 30-39.9) 08/06/2010   Past Medical History:  Diagnosis Date  . Abnormal EKG   . Abnormal pap    s/p cryotherapy  . Diverticulosis   . Hyperlipidemia   . Hypertension   . S/P colonoscopic polypectomy     Family History  Problem Relation Age of Onset  . Alzheimer's disease Mother   . Hyperlipidemia Mother   . Other Father        died of gunshot wound  . Osteoporosis Sister   . Osteoporosis Sister   . ALS Maternal Aunt   . ALS Maternal Uncle   . Diabetes Paternal Aunt   . Cancer Maternal Grandfather        lung cancer  . Cancer Paternal Grandmother        ?type  . Heart disease Neg Hx     Past Surgical History:  Procedure Laterality Date  . COLONOSCOPY  03/18/11; 2000   Dr. Amedeo Plenty; diverticulosis in sigmoid colon  . crysurgery  age 58   for abnormal pap  . LAMINECTOMY  03/2002   Social History   Occupational History  . Occupation: quality control    Employer: [employer]  Tobacco Use  . Smoking status: Never Smoker  . Smokeless tobacco: Never Used  Substance and Sexual Activity  . Alcohol use: Yes    Comment: glass of wine 1-2 times per week.  . Drug use: No  . Sexual activity: Yes    Partners: Male    Comment: postmenopausal

## 2017-01-20 ENCOUNTER — Ambulatory Visit (INDEPENDENT_AMBULATORY_CARE_PROVIDER_SITE_OTHER): Payer: 59 | Admitting: Orthopedic Surgery

## 2017-01-20 ENCOUNTER — Encounter (INDEPENDENT_AMBULATORY_CARE_PROVIDER_SITE_OTHER): Payer: Self-pay | Admitting: Orthopedic Surgery

## 2017-01-20 ENCOUNTER — Ambulatory Visit: Payer: 59 | Admitting: Family Medicine

## 2017-01-20 DIAGNOSIS — L600 Ingrowing nail: Secondary | ICD-10-CM | POA: Diagnosis not present

## 2017-01-20 DIAGNOSIS — M1711 Unilateral primary osteoarthritis, right knee: Secondary | ICD-10-CM

## 2017-01-20 MED ORDER — METHYLPREDNISOLONE ACETATE 40 MG/ML IJ SUSP
40.0000 mg | INTRAMUSCULAR | Status: AC | PRN
  Administered 2017-01-20: 40 mg via INTRA_ARTICULAR

## 2017-01-20 MED ORDER — LIDOCAINE HCL 1 % IJ SOLN
5.0000 mL | INTRAMUSCULAR | Status: AC | PRN
  Administered 2017-01-20: 5 mL

## 2017-01-20 NOTE — Progress Notes (Signed)
Office Visit Note   Patient: Amber Church           Date of Birth: Oct 20, 1955           MRN: [id number] Visit Date: 01/20/2017              Requested by: Denita Lung, MD [address] [place], [place] [address] PCP: Denita Lung, MD  Chief Complaint  Patient presents with  . Right Knee - Follow-up  . Right Great Toe - Follow-up  . Right Ankle - Follow-up  . Left Ankle - Follow-up      HPI: Patient is a 62 year old woman who states she had good temporary relief with the steroid injection for her right knee she states that her ingrown toenail on the right is getting better but is still tender.  Patient states that she has been using Celebrex twice a day and when it really flared up she took 4 and a day discussed that this is too much of the anti-inflammatory.  Assessment & Plan: Visit Diagnoses:  1. Unilateral primary osteoarthritis, right knee   2. Ingrowing nail, right great toe     Plan: The right knee was injected she will follow-up as needed.  Discussed that if she has mechanical symptoms we may need to obtain an MRI scan.  Follow-Up Instructions: Return if symptoms worsen or fail to improve.   Ortho Exam  Patient is alert, oriented, no adenopathy, well-dressed, normal affect, normal respiratory effort. Examination patient has an antalgic gait.  She is tender to palpation of the medial and lateral joint line collaterals and cruciates are stable.  There is no effusion there is no redness.  Emanation of her right foot the ingrown toenail shows no evidence of a paronychial infection it is tender to palpation over the distal aspect the medial border of the great toenail but no swelling no drainage no cellulitis.  Imaging: No results found. No images are attached to the encounter.  Labs: No results found for: HGBA1C, ESRSEDRATE, CRP, LABURIC, REPTSTATUS, GRAMSTAIN, CULT, LABORGA  @LABSALLVALUES (HGBA1)@  There is no height or weight on file to  calculate BMI.  Orders:  No orders of the defined types were placed in this encounter.  No orders of the defined types were placed in this encounter.    Procedures: Large Joint Inj: R knee on 01/20/2017 11:33 AM Indications: pain and diagnostic evaluation Details: 22 G 1.5 in needle, anteromedial approach  Arthrogram: No  Medications: 5 mL lidocaine 1 %; 40 mg methylPREDNISolone acetate 40 MG/ML Outcome: tolerated well, no immediate complications Procedure, treatment alternatives, risks and benefits explained, specific risks discussed. Consent was given by the patient. Immediately prior to procedure a time out was called to verify the correct patient, procedure, equipment, support staff and site/side marked as required. Patient was prepped and draped in the usual sterile fashion.      Clinical Data: No additional findings.  ROS:  All other systems negative, except as noted in the HPI. Review of Systems  Objective: Vital Signs: There were no vitals taken for this visit.  Specialty Comments:  No specialty comments available.  PMFS History: Patient Active Problem List   Diagnosis Date Noted  . Ingrowing nail, right great toe 12/23/2016  . Achilles tendonitis, bilateral 05/13/2016  . Achilles tendon contracture, bilateral 05/13/2016  . Hypertension 08/06/2010  . Hyperlipidemia LDL goal <100 08/06/2010  . Obesity (BMI 30-39.9) 08/06/2010   Past Medical History:  Diagnosis Date  . Abnormal EKG   .  Abnormal pap    s/p cryotherapy  . Diverticulosis   . Hyperlipidemia   . Hypertension   . S/P colonoscopic polypectomy     Family History  Problem Relation Age of Onset  . Alzheimer's disease Mother   . Hyperlipidemia Mother   . Other Father        died of gunshot wound  . Osteoporosis Sister   . Osteoporosis Sister   . ALS Maternal Aunt   . ALS Maternal Uncle   . Diabetes Paternal Aunt   . Cancer Maternal Grandfather        lung cancer  . Cancer Paternal  Grandmother        ?type  . Heart disease Neg Hx     Past Surgical History:  Procedure Laterality Date  . COLONOSCOPY  03/18/11; 2000   Dr. Amedeo Plenty; diverticulosis in sigmoid colon  . crysurgery  age 69   for abnormal pap  . LAMINECTOMY  03/2002   Social History   Occupational History  . Occupation: quality control    Employer: [employer]  Tobacco Use  . Smoking status: Never Smoker  . Smokeless tobacco: Never Used  Substance and Sexual Activity  . Alcohol use: Yes    Comment: glass of wine 1-2 times per week.  . Drug use: No  . Sexual activity: Yes    Partners: Male    Comment: postmenopausal

## 2017-02-19 ENCOUNTER — Other Ambulatory Visit: Payer: Self-pay

## 2017-02-19 ENCOUNTER — Encounter: Payer: Self-pay | Admitting: Family Medicine

## 2017-02-19 ENCOUNTER — Telehealth: Payer: Self-pay

## 2017-02-19 ENCOUNTER — Ambulatory Visit (INDEPENDENT_AMBULATORY_CARE_PROVIDER_SITE_OTHER): Payer: 59 | Admitting: Family Medicine

## 2017-02-19 VITALS — BP 120/68 | HR 83 | Wt 202.4 lb

## 2017-02-19 DIAGNOSIS — M1990 Unspecified osteoarthritis, unspecified site: Secondary | ICD-10-CM

## 2017-02-19 MED ORDER — DICLOFENAC SODIUM 75 MG PO TBEC: 75.0000 mg | DELAYED_RELEASE_TABLET | Freq: Two times a day (BID) | ORAL | 1 refills | Status: DC

## 2017-02-19 NOTE — Telephone Encounter (Signed)
Called pt to let her know that the [facility] as requested. Thanks [employer]

## 2017-02-19 NOTE — Progress Notes (Signed)
   Subjective:    Patient ID: Amber Church, female    DOB: 1955/05/30, 62 y.o.   MRN: [id number]  HPI She is here for consult concerning continued difficulty with knee pain.  She has been followed by Dr. Sharol Given for this. She was seen January 14 and given an injection of steroid.  Unfortunately he did not last very long.  She is here to discuss further options.  Presently she is taking 800 mg 3 times daily of ibuprofen with minimal benefit.  Review of Systems     Objective:   Physical Exam Alert and in no distress otherwise not examined       Assessment & Plan:  Arthritis - Plan: diclofenac (VOLTAREN) 75 MG EC tablet I will switch her to Voltaren to see if this will help with her pain.  I then discussed options concerning her knee.  Explained that the next step will most likely be the use of a joint lubricant such as Synvisc.  I explained that these are all temporizing things to give her relief of her arthritis before she will probably eventually need to have surgery.  Discussed possible options for surgery including partial versus total knee replacement depending upon the amount of damage done in her knee.  She does have an appointment set up with Dr. Sharol Given in the near future.

## 2017-02-20 ENCOUNTER — Ambulatory Visit (INDEPENDENT_AMBULATORY_CARE_PROVIDER_SITE_OTHER): Payer: 59 | Admitting: Orthopedic Surgery

## 2017-02-27 ENCOUNTER — Encounter (INDEPENDENT_AMBULATORY_CARE_PROVIDER_SITE_OTHER): Payer: Self-pay | Admitting: Orthopedic Surgery

## 2017-02-27 ENCOUNTER — Ambulatory Visit (INDEPENDENT_AMBULATORY_CARE_PROVIDER_SITE_OTHER): Payer: 59 | Admitting: Orthopedic Surgery

## 2017-02-27 ENCOUNTER — Telehealth (INDEPENDENT_AMBULATORY_CARE_PROVIDER_SITE_OTHER): Payer: Self-pay

## 2017-02-27 DIAGNOSIS — M1711 Unilateral primary osteoarthritis, right knee: Secondary | ICD-10-CM

## 2017-02-27 NOTE — Telephone Encounter (Signed)
Pt to have gel injection right knee DX: M17.11 Synvisc or monovisc whichever one insurance will cover.

## 2017-02-27 NOTE — Progress Notes (Signed)
Office Visit Note   Patient: Amber Church           Date of Birth: 1955/04/19           MRN: [id number] Visit Date: 02/27/2017              Requested by: Denita Lung, MD [place], [place] [address] PCP: Denita Lung, MD  No chief complaint on file.     HPI: Patient is a 62 year old woman who states she had poor relief with the last steroid injection for her right knee which was about 6 weeks ago. Patient states that she has been using Celebrex twice a day which is no longer helping.   Has spoken with Dr. Redmond School who has recommended the series of lubricating injections. She would like to discuss these today.  Assessment & Plan: Visit Diagnoses:  1. Primary osteoarthritis of right knee     Plan: will proceed with hyaluroic acid injection. Will call to schedule once PA'd.  Follow-Up Instructions: Return for HA injection right knee.   Ortho Exam  Patient is alert, oriented, no adenopathy, well-dressed, normal affect, normal respiratory effort. Examination patient has an antalgic gait.  She is tender to palpation of the medial and lateral joint line collaterals and cruciates are stable.  There is no effusion there is no redness.  No swelling no drainage no cellulitis.  Imaging: No results found. No images are attached to the encounter.  Labs: No results found for: HGBA1C, ESRSEDRATE, CRP, LABURIC, REPTSTATUS, GRAMSTAIN, CULT, LABORGA  @LABSALLVALUES (HGBA1)@  There is no height or weight on file to calculate BMI.  Orders:  No orders of the defined types were placed in this encounter.  No orders of the defined types were placed in this encounter.    Procedures: No procedures performed  Clinical Data: No additional findings.  ROS:  All other systems negative, except as noted in the HPI. Review of Systems  Constitutional: Negative for chills and fever.  Musculoskeletal: Positive for arthralgias. Negative for joint swelling.     Objective: Vital Signs: There were no vitals taken for this visit.  Specialty Comments:  No specialty comments available.  PMFS History: Patient Active Problem List   Diagnosis Date Noted  . Ingrowing nail, right great toe 12/23/2016  . Achilles tendonitis, bilateral 05/13/2016  . Achilles tendon contracture, bilateral 05/13/2016  . Hypertension 08/06/2010  . Hyperlipidemia LDL goal <100 08/06/2010  . Obesity (BMI 30-39.9) 08/06/2010   Past Medical History:  Diagnosis Date  . Abnormal EKG   . Abnormal pap    s/p cryotherapy  . Diverticulosis   . Hyperlipidemia   . Hypertension   . S/P colonoscopic polypectomy     Family History  Problem Relation Age of Onset  . Alzheimer's disease Mother   . Hyperlipidemia Mother   . Other Father        died of gunshot wound  . Osteoporosis Sister   . Osteoporosis Sister   . ALS Maternal Aunt   . ALS Maternal Uncle   . Diabetes Paternal Aunt   . Cancer Maternal Grandfather        lung cancer  . Cancer Paternal Grandmother        ?type  . Heart disease Neg Hx     Past Surgical History:  Procedure Laterality Date  . COLONOSCOPY  03/18/11; 2000   Dr. Amedeo Plenty; diverticulosis in sigmoid colon  . crysurgery  age 80   for abnormal pap  .  LAMINECTOMY  03/2002   Social History   Occupational History  . Occupation: quality control    Employer: [employer]  Tobacco Use  . Smoking status: Never Smoker  . Smokeless tobacco: Never Used  Substance and Sexual Activity  . Alcohol use: Yes    Comment: glass of wine 1-2 times per week.  . Drug use: No  . Sexual activity: Yes    Partners: Male    Comment: postmenopausal

## 2017-02-27 NOTE — Telephone Encounter (Signed)
Noted.  Submitted application online for Monovisc right knee

## 2017-03-04 ENCOUNTER — Telehealth (INDEPENDENT_AMBULATORY_CARE_PROVIDER_SITE_OTHER): Payer: Self-pay | Admitting: Orthopedic Surgery

## 2017-03-04 NOTE — Telephone Encounter (Signed)
Amber Church left a message on my voicemail asking about the injection that was discussed. She states that she was supposed to be scheduled after her insurance approved the injection and she has not heard from anyone.  She has a reference # of [id number] from [facility] and is asking that someone call and check on her prior auth, states that have questions that need to be answered before they can give approval.

## 2017-03-04 NOTE — Telephone Encounter (Signed)
Could you check on this and call the pt please?

## 2017-03-04 NOTE — Telephone Encounter (Signed)
Talked with Amber Church with [employer] to check the status of patient's benefits.  She advised me that the case manager is waiting for a call from another department with [facility] to confirm patient's benefits.  Stated that she would send an e-mail to the case manager to let her know that I called about patient's status.  Called and left VM advising patient of message above and to return my call concerning Injection.

## 2017-03-04 NOTE — Telephone Encounter (Signed)
Duplicate.  Will call and check status.  Thank you

## 2017-03-06 ENCOUNTER — Telehealth (INDEPENDENT_AMBULATORY_CARE_PROVIDER_SITE_OTHER): Payer: Self-pay

## 2017-03-06 NOTE — Telephone Encounter (Signed)
Submitted Rx form from [employer] portal

## 2017-03-10 ENCOUNTER — Telehealth (INDEPENDENT_AMBULATORY_CARE_PROVIDER_SITE_OTHER): Payer: Self-pay | Admitting: Orthopedic Surgery

## 2017-03-10 NOTE — Telephone Encounter (Signed)
Cover my Meds key [id number] to access the prior authorization for monovisc.

## 2017-03-11 ENCOUNTER — Telehealth (INDEPENDENT_AMBULATORY_CARE_PROVIDER_SITE_OTHER): Payer: Self-pay | Admitting: Orthopedic Surgery

## 2017-03-11 NOTE — Telephone Encounter (Signed)
Addendum: paperwork sent in for this authorization did not have Dr. Jess Barters signature on it. Please call [phone] regarding this, plus call the patient and let her know the status.

## 2017-03-11 NOTE — Telephone Encounter (Signed)
Patient called asked if the medication for her right knee injection has been approved. Patient advised if she do not answer the phone please leave a message. The number to contact patient is [phone]

## 2017-03-13 NOTE — Telephone Encounter (Signed)
Patient left voicemail checking status of right knee injection approval, she said she is feeling very uncomfortable and is ready to make an appt to receive injection. Is there a way to tell if it has been authorized without April here? Please advise # [phone]

## 2017-03-13 NOTE — Telephone Encounter (Signed)
Called pt to advise pending approval and we will call once this has been obtained. Can you check on this please.

## 2017-03-14 ENCOUNTER — Telehealth (INDEPENDENT_AMBULATORY_CARE_PROVIDER_SITE_OTHER): Payer: Self-pay

## 2017-03-14 NOTE — Telephone Encounter (Signed)
Called and left VM advising patient that a PA was needed for her insurance through [employer].and that it had been submitted today.

## 2017-03-14 NOTE — Telephone Encounter (Signed)
Thank you. PA submitted through [employer]eds online.

## 2017-03-14 NOTE — Telephone Encounter (Signed)
Called and left Vm advising patient that PA was submitted through [employer]mymeds for right knee Monovisc injection.

## 2017-03-14 NOTE — Telephone Encounter (Signed)
Completed PA online through [employer].

## 2017-03-14 NOTE — Telephone Encounter (Signed)
Duplicate Message

## 2017-03-17 ENCOUNTER — Telehealth (INDEPENDENT_AMBULATORY_CARE_PROVIDER_SITE_OTHER): Payer: Self-pay

## 2017-03-17 NOTE — Telephone Encounter (Signed)
Submitted application for [employer] for right knee.

## 2017-03-17 NOTE — Telephone Encounter (Signed)
Talked with patient and advised her that her insurance denied injection for Monovisc due to not trying one of there preferred products.  Advised patient that I applied for SynviscOne injection which is preferred through her insurance.

## 2017-03-20 ENCOUNTER — Encounter: Payer: Self-pay | Admitting: Family Medicine

## 2017-03-20 ENCOUNTER — Ambulatory Visit (INDEPENDENT_AMBULATORY_CARE_PROVIDER_SITE_OTHER): Payer: 59 | Admitting: Family Medicine

## 2017-03-20 VITALS — BP 124/84 | HR 91 | Temp 98.2°F | Ht 64.0 in | Wt 201.2 lb

## 2017-03-20 DIAGNOSIS — J069 Acute upper respiratory infection, unspecified: Secondary | ICD-10-CM

## 2017-03-20 DIAGNOSIS — M549 Dorsalgia, unspecified: Secondary | ICD-10-CM

## 2017-03-20 LAB — POCT URINALYSIS DIPSTICK
BILIRUBIN UA: NEGATIVE
Glucose, UA: NEGATIVE
Ketones, UA: NEGATIVE
Nitrite, UA: NEGATIVE
PH UA: 6 (ref 5.0–8.0)
Protein, UA: NEGATIVE
Spec Grav, UA: 1.025 (ref 1.010–1.025)
Urobilinogen, UA: NEGATIVE E.U./dL — AB

## 2017-03-20 NOTE — Patient Instructions (Addendum)
Keep using the Voltaren for pain relief and not the ibuprofen

## 2017-03-20 NOTE — Progress Notes (Signed)
   Subjective:    Patient ID: Amber Church, female    DOB: 1955-08-20, 62 y.o.   MRN: [id number]  HPI She has a 2-day history of rhinorrhea, nasal congestion, sore throat but no earache, cough, congestion, fever or chills. She also has a 5-day history of right posterolateral rib pain that initially was 8 out of 10 and now down to a 4 out of 10.  Is made worse with  breathing and movement.  She has no history of injury or overuse.   Review of Systems     Objective:   Physical Exam Alert and in no distress. Tympanic membranes and canals are normal. Pharyngeal area is normal. Neck is supple without adenopathy or thyromegaly. Cardiac exam shows a regular sinus rhythm without murmurs or gallops. Lungs are clear to auscultation. No tenderness to palpation to the flank or rib area.       Assessment & Plan:  Viral upper respiratory tract infection  Musculoskeletal back pain I explained that this is probably musculoskeletal in nature and of no major concern.  Recommended conservative care for that and for her URI.  She was comfortable with that.

## 2017-03-20 NOTE — Addendum Note (Signed)
Addended by: Caprice Beaver T on: 03/20/2017 01:41 PM   Modules accepted: Orders

## 2017-03-21 ENCOUNTER — Telehealth (INDEPENDENT_AMBULATORY_CARE_PROVIDER_SITE_OTHER): Payer: Self-pay

## 2017-03-21 NOTE — Telephone Encounter (Signed)
Called and left VM for patient to return my call.  Faxed PA to [facility] at [phone] for Monovisc, right knee.

## 2017-03-25 ENCOUNTER — Telehealth (INDEPENDENT_AMBULATORY_CARE_PROVIDER_SITE_OTHER): Payer: Self-pay

## 2017-03-25 NOTE — Telephone Encounter (Signed)
Patient called to check status for Gel Injection.  Advised patient that I received a form from [facility] that had to be completed and faxed back.  Completed form and faxed back on Friday-03/21/17.

## 2017-03-28 ENCOUNTER — Other Ambulatory Visit: Payer: Self-pay | Admitting: Family Medicine

## 2017-03-28 DIAGNOSIS — E785 Hyperlipidemia, unspecified: Secondary | ICD-10-CM

## 2017-03-28 DIAGNOSIS — I10 Essential (primary) hypertension: Secondary | ICD-10-CM

## 2017-03-28 DIAGNOSIS — M1990 Unspecified osteoarthritis, unspecified site: Secondary | ICD-10-CM

## 2017-03-28 NOTE — Telephone Encounter (Signed)
Pt is requesting celebrex be sent in to [facility] in [place]. Pt says she is out and pt has a CPE scheduled for the end of may. [place]

## 2017-03-28 NOTE — Telephone Encounter (Signed)
Pt says she only needs a [facility] day supply [facility] and her 90 day can be sent to [facility]. Thanks [employer]

## 2017-03-28 NOTE — Telephone Encounter (Signed)
New Message   *STAT* If patient is at the pharmacy, call can be transferred to refill team.   1. Which medications need to be refilled? (please list name of each medication and dose if known)  Celebrex 200 mg capsule twice daily  2. Which pharmacy/location (including street and city if local pharmacy) is medication to be sent to? [facility], [place], [place] [address]  3. Do they need a 30 day or 90 day supply?  30 day supply

## 2017-03-31 ENCOUNTER — Telehealth: Payer: Self-pay | Admitting: Family Medicine

## 2017-03-31 NOTE — Telephone Encounter (Signed)
Pt called, said called twice on Friday for refill Celebrex to local [facility] because she is out of her Celebrex while waiting on her mail order and checked all weekend at [facility] and it was never sent in.  I apologized to pt.   Ok per [employer].  [facility] for [facility]

## 2017-04-14 ENCOUNTER — Encounter (INDEPENDENT_AMBULATORY_CARE_PROVIDER_SITE_OTHER): Payer: Self-pay | Admitting: Orthopedic Surgery

## 2017-04-14 ENCOUNTER — Ambulatory Visit (INDEPENDENT_AMBULATORY_CARE_PROVIDER_SITE_OTHER): Payer: 59 | Admitting: Orthopedic Surgery

## 2017-04-14 VITALS — Ht 64.0 in | Wt 201.0 lb

## 2017-04-14 DIAGNOSIS — M1711 Unilateral primary osteoarthritis, right knee: Secondary | ICD-10-CM | POA: Diagnosis not present

## 2017-04-14 MED ORDER — HYLAN G-F 20 48 MG/6ML IX SOSY
48.0000 mg | PREFILLED_SYRINGE | INTRA_ARTICULAR | Status: AC | PRN
  Administered 2017-04-14: 48 mg via INTRA_ARTICULAR

## 2017-04-14 MED ORDER — METHYLPREDNISOLONE ACETATE 40 MG/ML IJ SUSP
40.0000 mg | INTRAMUSCULAR | Status: AC | PRN
  Administered 2017-04-14: 40 mg via INTRA_ARTICULAR

## 2017-04-14 MED ORDER — LIDOCAINE HCL 1 % IJ SOLN
1.0000 mL | INTRAMUSCULAR | Status: AC | PRN
  Administered 2017-04-14: 1 mL

## 2017-04-14 NOTE — Progress Notes (Signed)
Office Visit Note   Patient: Amber Church           Date of Birth: [birth year]           MRN: [id number] Visit Date: 04/14/2017              Requested by: Denita Lung, MD [address] [place], [place] [address] PCP: Denita Lung, MD  Chief Complaint  Patient presents with  . Right Knee - Follow-up    Synvisc one buy and bill right knee      HPI: Patient presents in evaluation for her right knee.  Patient complains of pain with activities of daily living persistent swelling.  Assessment & Plan: Visit Diagnoses:  1. Primary osteoarthritis of right knee     Plan: The right knee was injected with Synvisc 1 she tolerated this well follow-up as needed.  Follow-Up Instructions: Return if symptoms worsen or fail to improve.   Ortho Exam  Patient is alert, oriented, no adenopathy, well-dressed, normal affect, normal respiratory effort. Examination patient has varicose veins around the right knee she has tenderness palpation over the medial and lateral joint line as well as the patellofemoral joint there is a mild effusion.  Collaterals and cruciates are stable.  After informed consent the right knee was injected from the anterior medial portal with Synvisc 1.  Imaging: No results found. No images are attached to the encounter.  Labs: No results found for: HGBA1C, ESRSEDRATE, CRP, LABURIC, REPTSTATUS, GRAMSTAIN, CULT, LABORGA  @LABSALLVALUES (HGBA1)@  Body mass index is 34.5 kg/m.  Orders:  No orders of the defined types were placed in this encounter.  No orders of the defined types were placed in this encounter.    Procedures: Large Joint Inj on 04/14/2017 10:50 AM Indications: pain and diagnostic evaluation Details: 22 G 1.5 in needle, anteromedial approach  Arthrogram: No  Medications: 40 mg methylPREDNISolone acetate 40 MG/ML; 1 mL lidocaine 1 %; 48 mg Hylan 48 MG/6ML Outcome: tolerated well, no immediate complications Procedure, treatment  alternatives, risks and benefits explained, specific risks discussed. Consent was given by the patient. Immediately prior to procedure a time out was called to verify the correct patient, procedure, equipment, support staff and site/side marked as required. Patient was prepped and draped in the usual sterile fashion.      Clinical Data: No additional findings.  ROS:  All other systems negative, except as noted in the HPI. Review of Systems  Objective: Vital Signs: Ht 5\' 4"  (1.626 m)   Wt 201 lb (91.2 kg)   BMI 34.50 kg/m   Specialty Comments:  No specialty comments available.  PMFS History: Patient Active Problem List   Diagnosis Date Noted  . Ingrowing nail, right great toe 12/23/2016  . Achilles tendonitis, bilateral 05/13/2016  . Achilles tendon contracture, bilateral 05/13/2016  . Hypertension 08/06/2010  . Hyperlipidemia LDL goal <100 08/06/2010  . Obesity (BMI 30-39.9) 08/06/2010   Past Medical History:  Diagnosis Date  . Abnormal EKG   . Abnormal pap    s/p cryotherapy  . Diverticulosis   . Hyperlipidemia   . Hypertension   . S/P colonoscopic polypectomy     Family History  Problem Relation Age of Onset  . Alzheimer's disease Mother   . Hyperlipidemia Mother   . Other Father        died of gunshot wound  . Osteoporosis Sister   . Osteoporosis Sister   . ALS Maternal Aunt   . ALS Maternal Uncle   .  Diabetes Paternal Aunt   . Cancer Maternal Grandfather        lung cancer  . Cancer Paternal Grandmother        ?type  . Heart disease Neg Hx     Past Surgical History:  Procedure Laterality Date  . COLONOSCOPY  03/18/11; 2000   Dr. Amedeo Plenty; diverticulosis in sigmoid colon  . crysurgery  age 62   for abnormal pap  . LAMINECTOMY  03/2002   Social History   Occupational History  . Occupation: quality control    Employer: [employer]  Tobacco Use  . Smoking status: Never Smoker  . Smokeless tobacco: Never Used  Substance and Sexual Activity    . Alcohol use: Yes    Comment: glass of wine 1-2 times per week.  . Drug use: No  . Sexual activity: Yes    Partners: Male    Comment: postmenopausal

## 2017-06-03 ENCOUNTER — Ambulatory Visit (INDEPENDENT_AMBULATORY_CARE_PROVIDER_SITE_OTHER): Payer: 59 | Admitting: Family Medicine

## 2017-06-03 ENCOUNTER — Encounter: Payer: Self-pay | Admitting: Family Medicine

## 2017-06-03 ENCOUNTER — Other Ambulatory Visit (HOSPITAL_COMMUNITY)
Admission: RE | Admit: 2017-06-03 | Discharge: 2017-06-03 | Disposition: A | Discharge status: Home / Self Care | Payer: 59 | Source: Ambulatory Visit | Attending: Family Medicine | Admitting: Family Medicine

## 2017-06-03 VITALS — BP 128/80 | HR 82 | Temp 97.6°F | Ht 65.0 in | Wt 201.2 lb

## 2017-06-03 DIAGNOSIS — E785 Hyperlipidemia, unspecified: Secondary | ICD-10-CM

## 2017-06-03 DIAGNOSIS — Z0000 Encounter for general adult medical examination without abnormal findings: Secondary | ICD-10-CM

## 2017-06-03 DIAGNOSIS — E669 Obesity, unspecified: Secondary | ICD-10-CM | POA: Diagnosis not present

## 2017-06-03 DIAGNOSIS — Z124 Encounter for screening for malignant neoplasm of cervix: Secondary | ICD-10-CM | POA: Insufficient documentation

## 2017-06-03 DIAGNOSIS — Z23 Encounter for immunization: Secondary | ICD-10-CM

## 2017-06-03 DIAGNOSIS — Z01419 Encounter for gynecological examination (general) (routine) without abnormal findings: Secondary | ICD-10-CM | POA: Diagnosis not present

## 2017-06-03 DIAGNOSIS — Z1239 Encounter for other screening for malignant neoplasm of breast: Secondary | ICD-10-CM

## 2017-06-03 DIAGNOSIS — Z1231 Encounter for screening mammogram for malignant neoplasm of breast: Secondary | ICD-10-CM | POA: Diagnosis not present

## 2017-06-03 DIAGNOSIS — I10 Essential (primary) hypertension: Secondary | ICD-10-CM

## 2017-06-03 DIAGNOSIS — M1990 Unspecified osteoarthritis, unspecified site: Secondary | ICD-10-CM

## 2017-06-03 LAB — POCT URINALYSIS DIP (PROADVANTAGE DEVICE)
Bilirubin, UA: NEGATIVE
Glucose, UA: NEGATIVE mg/dL
Ketones, POC UA: NEGATIVE mg/dL
Leukocytes, UA: NEGATIVE
Nitrite, UA: NEGATIVE
Protein Ur, POC: NEGATIVE mg/dL
Specific Gravity, Urine: 1.03
Urobilinogen, Ur: 3.5
pH, UA: 5.5

## 2017-06-03 LAB — CBC WITH DIFFERENTIAL/PLATELET
Basophils Absolute: 0 10*3/uL (ref 0.0–0.2)
Basos: 0 %
EOS (ABSOLUTE): 0.2 10*3/uL (ref 0.0–0.4)
EOS: 3 %
HEMATOCRIT: 38.9 % (ref 34.0–46.6)
HEMOGLOBIN: 13.6 g/dL (ref 11.1–15.9)
IMMATURE GRANS (ABS): 0 10*3/uL (ref 0.0–0.1)
Immature Granulocytes: 0 %
LYMPHS: 35 %
Lymphocytes Absolute: 2.6 10*3/uL (ref 0.7–3.1)
MCH: 30.4 pg (ref 26.6–33.0)
MCHC: 35 g/dL (ref 31.5–35.7)
MCV: 87 fL (ref 79–97)
MONOCYTES: 8 %
Monocytes Absolute: 0.6 10*3/uL (ref 0.1–0.9)
NEUTROS ABS: 4 10*3/uL (ref 1.4–7.0)
Neutrophils: 54 %
Platelets: 336 10*3/uL (ref 150–450)
RBC: 4.47 x10E6/uL (ref 3.77–5.28)
RDW: 13.9 % (ref 12.3–15.4)
WBC: 7.5 10*3/uL (ref 3.4–10.8)

## 2017-06-03 LAB — LIPID PANEL
Chol/HDL Ratio: 3.5 ratio (ref 0.0–4.4)
Cholesterol, Total: 196 mg/dL (ref 100–199)
HDL: 56 mg/dL (ref >39)
LDL CALC: 113 mg/dL — AB (ref 0–99)
Triglycerides: 136 mg/dL (ref 0–149)
VLDL CHOLESTEROL CAL: 27 mg/dL (ref 5–40)

## 2017-06-03 LAB — COMPREHENSIVE METABOLIC PANEL
ALBUMIN: 4.7 g/dL (ref 3.6–4.8)
ALT: 11 IU/L (ref 0–32)
AST: 19 IU/L (ref 0–40)
Albumin/Globulin Ratio: 1.6 (ref 1.2–2.2)
Alkaline Phosphatase: 88 IU/L (ref 39–117)
BILIRUBIN TOTAL: 0.4 mg/dL (ref 0.0–1.2)
BUN / CREAT RATIO: 17 (ref 12–28)
BUN: 15 mg/dL (ref 8–27)
CO2: 23 mmol/L (ref 20–29)
CREATININE: 0.86 mg/dL (ref 0.57–1.00)
Calcium: 9.6 mg/dL (ref 8.7–10.3)
Chloride: 101 mmol/L (ref 96–106)
GFR calc non Af Amer: 73 mL/min/{1.73_m2} (ref >59)
GFR, EST AFRICAN AMERICAN: 84 mL/min/{1.73_m2} (ref >59)
Globulin, Total: 2.9 g/dL (ref 1.5–4.5)
Glucose: 100 mg/dL — ABNORMAL HIGH (ref 65–99)
Potassium: 4.3 mmol/L (ref 3.5–5.2)
Sodium: 140 mmol/L (ref 134–144)
TOTAL PROTEIN: 7.6 g/dL (ref 6.0–8.5)

## 2017-06-03 MED ORDER — DICLOFENAC SODIUM 1 % TD GEL: TRANSDERMAL | 3 refills | Status: DC

## 2017-06-03 MED ORDER — PRAVASTATIN SODIUM 40 MG PO TABS: 40.0000 mg | ORAL_TABLET | Freq: Every day | ORAL | 3 refills | Status: DC

## 2017-06-03 MED ORDER — LISINOPRIL-HYDROCHLOROTHIAZIDE 10-12.5 MG PO TABS: 1.0000 | ORAL_TABLET | Freq: Every day | ORAL | 3 refills | Status: DC

## 2017-06-03 MED ORDER — CELECOXIB 200 MG PO CAPS: ORAL_CAPSULE | ORAL | 1 refills | Status: DC

## 2017-06-03 NOTE — Progress Notes (Signed)
Subjective:    Patient ID: Amber Church, female    DOB: Sep 15, 1955, 62 y.o.   MRN: [id number]  HPI She is here for complete examination.  She continues to be followed by Dr. Sharol Given for her right knee pain.  Apparently she has had steroid injections as well as another injection with I am guessing Synvisc.  She states that it has not helped much.  She also complains of a triggering sensation in her right thumb otherwise no particular problems in that area.  She does continue on alternating doses of Celebrex as well as ibuprofen.  She is also taking lisinopril/HCTZ and having no difficulty with that.  She takes pravastatin and has no aches or pains.  She is now unemployed but does apparently work part-time.  She was given a Customer service manager.  She has no other concerns or complaints.  Family and social history as well as health maintenance and immunizations was reviewed   Review of Systems  All other systems reviewed and are negative.      Objective:   Physical Exam BP 128/80 (BP Location: Left Arm, Patient Position: Sitting)   Pulse 82   Temp 97.6 F (36.4 C)   Ht 5\' 5"  (1.651 m)   Wt 201 lb 3.2 oz (91.3 kg)   SpO2 99%   BMI 33.48 kg/m   General Appearance:    Alert, cooperative, no distress, appears stated age  Head:    Normocephalic, without obvious abnormality, atraumatic  Eyes:    PERRL, conjunctiva/corneas clear, EOM's intact, fundi    benign  Ears:    Normal TM's and external ear canals  Nose:   Nares normal, mucosa normal, no drainage or sinus   tenderness  Throat:   Lips, mucosa, and tongue normal; teeth and gums normal  Neck:   Supple, no lymphadenopathy;  thyroid:  no   enlargement/tenderness/nodules; no carotid   bruit or JVD     Lungs:     Clear to auscultation bilaterally without wheezes, rales or     ronchi; respirations unlabored      Heart:    Regular rate and rhythm, S1 and S2 normal, no murmur, rub   or gallop     Abdomen:     Soft, non-tender,  nondistended, normoactive bowel sounds,    no masses, no hepatosplenomegaly  Genitalia:    Normal external genitalia without lesions.  BUS and vagina normal; cervix without lesions, or cervical motion tenderness. No abnormal vaginal discharge.  Uterus and adnexa not enlarged, nontender, no masses.  Pap performed  Rectal:   Deferred  Extremities:   No clubbing, cyanosis or edema  Pulses:   2+ and symmetric all extremities  Skin:   Skin color, texture, turgor normal, no rashes or lesions  Lymph nodes:   Cervical, supraclavicular, and axillary nodes normal  Neurologic:   CNII-XII intact, normal strength, sensation and gait; reflexes 2+ and symmetric throughout          Psych:   Normal mood, affect, hygiene and grooming.       Assessment & Plan:  Annual physical exam - Plan: CBC with Differential/Platelet, Comprehensive metabolic panel, Lipid panel  Arthritis - Plan: celecoxib (CELEBREX) 200 MG capsule, diclofenac sodium (VOLTAREN) 1 % GEL  Obesity (BMI 30-39.9) - Plan: CBC with Differential/Platelet, Comprehensive metabolic panel, Lipid panel  Essential hypertension - Plan: CBC with Differential/Platelet, Comprehensive metabolic panel, lisinopril-hydrochlorothiazide (PRINZIDE,ZESTORETIC) 10-12.5 MG tablet  Hyperlipidemia LDL goal <100 - Plan: Lipid panel, pravastatin (PRAVACHOL) 40  MG tablet  Screening for breast cancer - Plan: MM DIGITAL SCREENING BILATERAL  Need for shingles vaccine - Plan: Varicella-zoster vaccine IM (Shingrix)  Screening for cervical cancer - Plan: Cytology - PAP([facility]) She will continue on her present medication regimen.  She was set up for follow-up mammogram.  Will also be given Shingrix and scheduled for follow-up on that. I then discussed the arthritis that she is having.  Encouraged her to at least consider getting a total knee replacement since she is now having difficulty even with the follow-up injections.

## 2017-06-03 NOTE — Addendum Note (Signed)
Addended by: Elyse Jarvis on: 06/03/2017 02:00 PM   Modules accepted: Orders

## 2017-06-04 LAB — HM PAP SMEAR

## 2017-06-06 LAB — CYTOLOGY - PAP

## 2017-07-07 ENCOUNTER — Encounter (INDEPENDENT_AMBULATORY_CARE_PROVIDER_SITE_OTHER): Payer: Self-pay | Admitting: Orthopedic Surgery

## 2017-07-07 ENCOUNTER — Ambulatory Visit (INDEPENDENT_AMBULATORY_CARE_PROVIDER_SITE_OTHER): Payer: 59 | Admitting: Orthopedic Surgery

## 2017-07-07 DIAGNOSIS — M1711 Unilateral primary osteoarthritis, right knee: Secondary | ICD-10-CM

## 2017-07-07 MED ORDER — METHYLPREDNISOLONE ACETATE 40 MG/ML IJ SUSP
40.0000 mg | INTRAMUSCULAR | Status: AC | PRN
  Administered 2017-07-07: 40 mg via INTRA_ARTICULAR

## 2017-07-07 MED ORDER — IBUPROFEN 800 MG PO TABS: 800.0000 mg | ORAL_TABLET | Freq: Three times a day (TID) | ORAL | 0 refills | Status: DC | PRN

## 2017-07-07 MED ORDER — LIDOCAINE HCL 1 % IJ SOLN
5.0000 mL | INTRAMUSCULAR | Status: AC | PRN
  Administered 2017-07-07: 5 mL

## 2017-07-07 NOTE — Progress Notes (Signed)
Office Visit Note   Patient: Amber Church           Date of Birth: [birth year]           MRN: [id number] Visit Date: 07/07/2017              Requested by: Denita Lung, MD [address] [place], [place] [address] PCP: Denita Lung, MD  Chief Complaint  Patient presents with  . Right Knee - Pain      HPI: Patient is a 62 year old woman who presents in follow-up for osteoarthritis right knee with bone-on-bone contact medial joint line.  She is 3 months status post Synvisc 1 injection she states that this has helped but the pain is reoccurring.  She is using 800 mg ibuprofen 3 times a day as needed for pain  Assessment & Plan: Visit Diagnoses:  1. Primary osteoarthritis of right knee     Plan: The right knee was injected with Depo-Medrol.  She was called in a prescription for ibuprofen.  Follow-up in 3 months for reevaluation.  Patient states she might like to consider repeat hyaluronic acid injection.  Follow-Up Instructions: Return in about 3 months (around 10/07/2017).   Ortho Exam  Patient is alert, oriented, no adenopathy, well-dressed, normal affect, normal respiratory effort. Examination patient has an antalgic gait.  She has varus alignment to the right knee she is tender to palpation of the medial joint line there is crepitation with range of motion there is minimal swelling collaterals and cruciates are stable she has varicose veins.  There is no redness no cellulitis.  Imaging: No results found. No images are attached to the encounter.  Labs: No results found for: HGBA1C, ESRSEDRATE, CRP, LABURIC, REPTSTATUS, GRAMSTAIN, CULT, LABORGA   Lab Results  Component Value Date   ALBUMIN 4.7 06/03/2017   ALBUMIN 4.0 01/15/2016   ALBUMIN 4.2 06/17/2014    There is no height or weight on file to calculate BMI.  Orders:  Orders Placed This Encounter  Procedures  . Large Joint Inj   Meds ordered this encounter  Medications  . ibuprofen  (ADVIL,MOTRIN) 800 MG tablet    Sig: Take 1 tablet (800 mg total) by mouth every 8 (eight) hours as needed.    Dispense:  90 tablet    Refill:  0     Procedures: Large Joint Inj: R knee on 07/07/2017 10:09 AM Indications: pain and diagnostic evaluation Details: 22 G 1.5 in needle, anteromedial approach  Arthrogram: No  Medications: 5 mL lidocaine 1 %; 40 mg methylPREDNISolone acetate 40 MG/ML Outcome: tolerated well, no immediate complications Procedure, treatment alternatives, risks and benefits explained, specific risks discussed. Consent was given by the patient. Immediately prior to procedure a time out was called to verify the correct patient, procedure, equipment, support staff and site/side marked as required. Patient was prepped and draped in the usual sterile fashion.      Clinical Data: No additional findings.  ROS:  All other systems negative, except as noted in the HPI. Review of Systems  Objective: Vital Signs: There were no vitals taken for this visit.  Specialty Comments:  No specialty comments available.  PMFS History: Patient Active Problem List   Diagnosis Date Noted  . Ingrowing nail, right great toe 12/23/2016  . Achilles tendonitis, bilateral 05/13/2016  . Achilles tendon contracture, bilateral 05/13/2016  . Hypertension 08/06/2010  . Hyperlipidemia LDL goal <100 08/06/2010  . Obesity (BMI 30-39.9) 08/06/2010   Past Medical History:  Diagnosis  Date  . Abnormal EKG   . Abnormal pap    s/p cryotherapy  . Diverticulosis   . Hyperlipidemia   . Hypertension   . S/P colonoscopic polypectomy     Family History  Problem Relation Age of Onset  . Alzheimer's disease Mother   . Hyperlipidemia Mother   . Other Father        died of gunshot wound  . Osteoporosis Sister   . Osteoporosis Sister   . ALS Maternal Aunt   . ALS Maternal Uncle   . Diabetes Paternal Aunt   . Cancer Maternal Grandfather        lung cancer  . Cancer Paternal Grandmother         ?type  . Heart disease Neg Hx     Past Surgical History:  Procedure Laterality Date  . COLONOSCOPY  03/18/11; 2000   Dr. Amedeo Plenty; diverticulosis in sigmoid colon  . crysurgery  age 71   for abnormal pap  . LAMINECTOMY  03/2002   Social History   Occupational History  . Occupation: quality control    Employer: [employer]  Tobacco Use  . Smoking status: Never Smoker  . Smokeless tobacco: Never Used  Substance and Sexual Activity  . Alcohol use: Yes    Comment: glass of wine 1-2 times per week.  . Drug use: No  . Sexual activity: Yes    Partners: Male    Comment: postmenopausal

## 2017-08-04 ENCOUNTER — Other Ambulatory Visit (INDEPENDENT_AMBULATORY_CARE_PROVIDER_SITE_OTHER): Payer: 59

## 2017-08-04 DIAGNOSIS — Z23 Encounter for immunization: Secondary | ICD-10-CM

## 2017-08-20 ENCOUNTER — Ambulatory Visit (INDEPENDENT_AMBULATORY_CARE_PROVIDER_SITE_OTHER): Payer: 59 | Admitting: Family Medicine

## 2017-08-20 ENCOUNTER — Encounter: Payer: Self-pay | Admitting: Family Medicine

## 2017-08-20 ENCOUNTER — Telehealth (INDEPENDENT_AMBULATORY_CARE_PROVIDER_SITE_OTHER): Payer: Self-pay | Admitting: Orthopedic Surgery

## 2017-08-20 VITALS — BP 112/78 | HR 89 | Temp 97.8°F | Wt 203.8 lb

## 2017-08-20 DIAGNOSIS — M545 Low back pain, unspecified: Secondary | ICD-10-CM

## 2017-08-20 NOTE — Telephone Encounter (Signed)
Patient called to request ibupofen 800 mg. Wants it called in to [facility] in [place].  Please call patient ot advise [phone]

## 2017-08-20 NOTE — Patient Instructions (Addendum)
Go ahead and take ibuprofen 3 times per day.  Heat for your back for 20 minutes 3 times per day and stretching after that.  Do back stretching with knee-to-chest one leg than the other the I think we just need to come to get seen back again just put you to 25 July fact week n both and then rotational stretching

## 2017-08-20 NOTE — Progress Notes (Signed)
   Subjective:    Patient ID: Amber Church, female    DOB: [birth year], 62 y.o.   MRN: [id number]  HPI She states that 10 days ago she rolled over in bed to her left side and has been experiencing left-sided low back pain since then.  She states that the pain is made worse with sitting however she is able to do her daily activities without difficulty.  There is been no numbness, tingling or weakness.  She is taking ibuprofen 800 mg twice per day for arthritic problems mainly in her knees.   Review of Systems     Objective:   Physical Exam Slight tenderness palpation in the left lower back area but not necessarily point tenderness over any one area.  Pain on motion of the back with normal lumbar curve.  Normal hip motion.  Negative straight leg raising.  Corky Sox testing was negative.       Assessment & Plan:  Acute low back pain without sciatica, unspecified back pain laterality Recommend supportive care with heat for 20 minutes 3 times per day and stretching.  Did demonstrate stretching exercises with her as well as rotational type exercises.  Recommend increasing her ibuprofen to 800 mg 3 times daily dosing.

## 2017-08-21 MED ORDER — IBUPROFEN 800 MG PO TABS: 800.0000 mg | ORAL_TABLET | Freq: Three times a day (TID) | ORAL | 0 refills | Status: DC | PRN

## 2017-08-21 NOTE — Telephone Encounter (Signed)
Rx sent to pharm

## 2017-08-26 ENCOUNTER — Other Ambulatory Visit (INDEPENDENT_AMBULATORY_CARE_PROVIDER_SITE_OTHER): Payer: Self-pay | Admitting: Orthopedic Surgery

## 2017-09-10 ENCOUNTER — Telehealth (INDEPENDENT_AMBULATORY_CARE_PROVIDER_SITE_OTHER): Payer: Self-pay

## 2017-09-10 ENCOUNTER — Telehealth (INDEPENDENT_AMBULATORY_CARE_PROVIDER_SITE_OTHER): Payer: Self-pay | Admitting: Orthopedic Surgery

## 2017-09-10 NOTE — Telephone Encounter (Signed)
[facility] in [place]   Medication refill  Ibuprofen(Advil,Motrin)800 tablet     Patient called to place med refill wanted to Dr.Duda she is currently taking 3 a day due to pain level, patient is having trouble sleeping.

## 2017-09-11 ENCOUNTER — Other Ambulatory Visit (INDEPENDENT_AMBULATORY_CARE_PROVIDER_SITE_OTHER): Payer: Self-pay

## 2017-09-11 MED ORDER — IBUPROFEN 800 MG PO TABS: 800.0000 mg | ORAL_TABLET | Freq: Three times a day (TID) | ORAL | 0 refills | Status: DC

## 2017-09-11 NOTE — Telephone Encounter (Signed)
Ok per [employer] [facility] to refill x 1

## 2017-09-11 NOTE — Telephone Encounter (Signed)
This has been completed.

## 2017-10-02 ENCOUNTER — Telehealth: Payer: Self-pay | Admitting: Family Medicine

## 2017-10-02 NOTE — Telephone Encounter (Signed)
Rcd letter from Dr Claudette Laws of [facility] requesting follow up information on this patients pap. Juliann Pulse called [phone] since we are both in [place] and was told this patient needs to have a followup due to the diagnosis of her pap.  Letter placed in your folder

## 2017-10-06 ENCOUNTER — Telehealth: Payer: Self-pay

## 2017-10-06 NOTE — Telephone Encounter (Signed)
Pt was called to schedule a follow up pap dor atypical glandular cells. Pt will call back to make a acute pap only. [place]

## 2017-10-07 ENCOUNTER — Ambulatory Visit (INDEPENDENT_AMBULATORY_CARE_PROVIDER_SITE_OTHER): Payer: 59 | Admitting: Orthopedic Surgery

## 2017-10-07 ENCOUNTER — Encounter (INDEPENDENT_AMBULATORY_CARE_PROVIDER_SITE_OTHER): Payer: Self-pay | Admitting: Orthopedic Surgery

## 2017-10-07 DIAGNOSIS — M1711 Unilateral primary osteoarthritis, right knee: Secondary | ICD-10-CM

## 2017-10-07 NOTE — Progress Notes (Signed)
Will order synvisc one injection

## 2017-10-13 ENCOUNTER — Encounter: Payer: Self-pay | Admitting: Family Medicine

## 2017-10-13 ENCOUNTER — Ambulatory Visit (INDEPENDENT_AMBULATORY_CARE_PROVIDER_SITE_OTHER): Payer: 59 | Admitting: Family Medicine

## 2017-10-13 ENCOUNTER — Other Ambulatory Visit (HOSPITAL_COMMUNITY)
Admission: RE | Admit: 2017-10-13 | Discharge: 2017-10-13 | Disposition: A | Discharge status: Home / Self Care | Payer: 59 | Source: Ambulatory Visit | Attending: Family Medicine | Admitting: Family Medicine

## 2017-10-13 VITALS — BP 122/82 | HR 75 | Temp 98.0°F | Wt 204.4 lb

## 2017-10-13 DIAGNOSIS — Z23 Encounter for immunization: Secondary | ICD-10-CM | POA: Diagnosis not present

## 2017-10-13 DIAGNOSIS — Z01419 Encounter for gynecological examination (general) (routine) without abnormal findings: Secondary | ICD-10-CM | POA: Diagnosis not present

## 2017-10-13 DIAGNOSIS — Z124 Encounter for screening for malignant neoplasm of cervix: Secondary | ICD-10-CM

## 2017-10-13 NOTE — Addendum Note (Signed)
Addended by: Elyse Jarvis on: 10/13/2017 02:36 PM   Modules accepted: Orders

## 2017-10-13 NOTE — Progress Notes (Signed)
   Subjective:    Patient ID: Amber Church, female    DOB: 21-Jan-1955, 62 y.o.   MRN: [id number]  HPI She recently had a Pap that showed glandular cells and needed to have it repeated.  She has no particular concerns or complaints.   Review of Systems     Objective:   Physical Exam Pap smear done without difficulty.       Assessment & Plan:

## 2017-10-14 ENCOUNTER — Telehealth (INDEPENDENT_AMBULATORY_CARE_PROVIDER_SITE_OTHER): Payer: Self-pay

## 2017-10-14 NOTE — Telephone Encounter (Signed)
Submitted VOB for SynviscOne, right knee. 

## 2017-10-15 DIAGNOSIS — Z1231 Encounter for screening mammogram for malignant neoplasm of breast: Secondary | ICD-10-CM | POA: Diagnosis not present

## 2017-10-15 LAB — HM MAMMOGRAPHY

## 2017-10-16 ENCOUNTER — Telehealth (INDEPENDENT_AMBULATORY_CARE_PROVIDER_SITE_OTHER): Payer: Self-pay

## 2017-10-16 LAB — CYTOLOGY - PAP: Diagnosis: NEGATIVE

## 2017-10-16 NOTE — Telephone Encounter (Signed)
PA required for SynviscOne, right knee. Per [facility] PA is currently pending and office notes need to be faxed to [phone]. Pending PA# [id number]  Faxed office notes to [facility] at [phone].

## 2017-10-20 ENCOUNTER — Telehealth (INDEPENDENT_AMBULATORY_CARE_PROVIDER_SITE_OTHER): Payer: Self-pay | Admitting: Orthopedic Surgery

## 2017-10-20 ENCOUNTER — Telehealth (INDEPENDENT_AMBULATORY_CARE_PROVIDER_SITE_OTHER): Payer: Self-pay

## 2017-10-20 ENCOUNTER — Ambulatory Visit (INDEPENDENT_AMBULATORY_CARE_PROVIDER_SITE_OTHER): Payer: 59 | Admitting: Orthopedic Surgery

## 2017-10-20 ENCOUNTER — Other Ambulatory Visit (INDEPENDENT_AMBULATORY_CARE_PROVIDER_SITE_OTHER): Payer: Self-pay

## 2017-10-20 MED ORDER — IBUPROFEN 800 MG PO TABS: 800.0000 mg | ORAL_TABLET | Freq: Three times a day (TID) | ORAL | 0 refills | Status: DC | PRN

## 2017-10-20 NOTE — Telephone Encounter (Signed)
Ok refill? 

## 2017-10-20 NOTE — Telephone Encounter (Signed)
This has been done.

## 2017-10-20 NOTE — Telephone Encounter (Signed)
Patient came into the clinic and requested a refill on her Ibuprofen.  She uses [facility] in [place].  CL#[id number].  She is out of the medication. Thank you.

## 2017-10-20 NOTE — Telephone Encounter (Signed)
Called and left a VM for patient to call back to R/S appointment for today due to authorization still pending for gel injection for right knee.

## 2017-10-20 NOTE — Telephone Encounter (Signed)
Talked with [place] and she stated that authorization for SynviscOne, right knee is still pending. Reference# [id number].

## 2017-10-20 NOTE — Telephone Encounter (Signed)
Do you want to refill please advise. Pt is pending approval for gel injection

## 2017-10-21 ENCOUNTER — Other Ambulatory Visit: Payer: Self-pay | Admitting: Family Medicine

## 2017-10-21 DIAGNOSIS — M1990 Unspecified osteoarthritis, unspecified site: Secondary | ICD-10-CM

## 2017-10-22 NOTE — Telephone Encounter (Signed)
[facility] is requsting to fill pt celebrex please advise. [place]

## 2017-10-23 ENCOUNTER — Telehealth (INDEPENDENT_AMBULATORY_CARE_PROVIDER_SITE_OTHER): Payer: Self-pay | Admitting: Orthopedic Surgery

## 2017-10-23 NOTE — Telephone Encounter (Signed)
Amber Church from [facility] called stating that they received a prescription for Ibuprofen.  The patient is also on Celebrex and wanted to know if the Celebrex is discontinued or are they still taking it.SWN#[id number]  CB#[phone], Option 2

## 2017-10-24 NOTE — Telephone Encounter (Signed)
Patient should take one or the other, not both, take whichever works better

## 2017-10-24 NOTE — Telephone Encounter (Signed)
See below and please advise

## 2017-10-28 NOTE — Telephone Encounter (Signed)
Called [facility] and advised of message below. They will contact the pt and fill which ever one the pt would like to use.

## 2017-11-19 ENCOUNTER — Telehealth (INDEPENDENT_AMBULATORY_CARE_PROVIDER_SITE_OTHER): Payer: Self-pay

## 2017-11-19 NOTE — Telephone Encounter (Signed)
Submitted for VOB for Euflexxa series due to patient not trying or failed Durolane, Euflexxa, and Gelsyn-3.

## 2017-11-21 ENCOUNTER — Telehealth (INDEPENDENT_AMBULATORY_CARE_PROVIDER_SITE_OTHER): Payer: Self-pay | Admitting: Orthopedic Surgery

## 2017-11-21 ENCOUNTER — Telehealth (INDEPENDENT_AMBULATORY_CARE_PROVIDER_SITE_OTHER): Payer: Self-pay

## 2017-11-21 NOTE — Telephone Encounter (Signed)
Patient called wanted to know if her injection was approved. Amber Church is scheduled for the 18th of November wanted to make sure she needed to come in for injection.

## 2017-11-21 NOTE — Telephone Encounter (Signed)
Talked with patient and advised her that she is approved for Euflexxa, right knee.  Patient has appointment scheduled for Monday, 11/24/2017.

## 2017-11-21 NOTE — Telephone Encounter (Signed)
Pt calling to check the status of gel injection

## 2017-11-21 NOTE — Telephone Encounter (Signed)
Patient is approved for  Euflexxa series, right knee. Buy & Bill Covered at 80% of the allowable amount. Patient will be responsible for 20% OOP. No Co-pay No PA required  Appt. 11/24/2017 with Dr. Sharol Given

## 2017-11-21 NOTE — Telephone Encounter (Signed)
Duplicate

## 2017-11-24 ENCOUNTER — Ambulatory Visit (INDEPENDENT_AMBULATORY_CARE_PROVIDER_SITE_OTHER): Payer: 59 | Admitting: Physician Assistant

## 2017-11-24 ENCOUNTER — Encounter (INDEPENDENT_AMBULATORY_CARE_PROVIDER_SITE_OTHER): Payer: Self-pay | Admitting: Orthopedic Surgery

## 2017-11-24 VITALS — Ht 65.0 in | Wt 204.4 lb

## 2017-11-24 DIAGNOSIS — M1711 Unilateral primary osteoarthritis, right knee: Secondary | ICD-10-CM | POA: Diagnosis not present

## 2017-11-24 MED ORDER — IBUPROFEN 800 MG PO TABS: 800.0000 mg | ORAL_TABLET | Freq: Three times a day (TID) | ORAL | 0 refills | Status: DC | PRN

## 2017-11-24 NOTE — Progress Notes (Signed)
Office Visit Note   Patient: Amber Church           Date of Birth: Dec 11, 1955           MRN: [id number] Visit Date: 11/24/2017              Requested by: Denita Lung, MD [address] [place], [place] [address] PCP: Denita Lung, MD  Chief Complaint  Patient presents with  . Right Knee - Pain, Follow-up    Buy & Bill; Euflexxa series inj right knee      HPI: The patient is a 62 year old female who is seen for follow-up for her right knee.  She reports moderate pain and swelling over the knee since her last steroid injection back in July.  She has been approved for Euflexxa and comes in today for the first injection.     Assessment & Plan: Visit Diagnoses:  1. Primary osteoarthritis of right knee     Plan: Hospital after informed consent and the patient's right knee was injected under sterile techniques with Euflexxa after local anesthetic with lidocaine.  The patient tolerated this well.  She will follow-up in 1 week for the second in the series of her injections.  She did request a prescription refill for ibuprofen 800 mg and this was sent to the [facility] in the evening.  Follow-Up Instructions: Return in about 1 week (around 12/01/2017).   Ortho Exam  Patient is alert, oriented, no adenopathy, well-dressed, normal affect, normal respiratory effort. The right knee is mildly effused.  She has medial joint line tenderness greater than laterally.  She has full extension 100 degrees of flexion with pain on end range.  Patient no knee instability.  Imaging: No results found. No images are attached to the encounter.  Labs: No results found for: HGBA1C, ESRSEDRATE, CRP, LABURIC, REPTSTATUS, GRAMSTAIN, CULT, LABORGA   Lab Results  Component Value Date   ALBUMIN 4.7 06/03/2017   ALBUMIN 4.0 01/15/2016   ALBUMIN 4.2 06/17/2014    Body mass index is 34.01 kg/m.  Orders:  No orders of the defined types were placed in this encounter.  Meds ordered this  encounter  Medications  . ibuprofen (ADVIL,MOTRIN) 800 MG tablet    Sig: Take 1 tablet (800 mg total) by mouth every 8 (eight) hours as needed (as needed).    Dispense:  90 tablet    Refill:  0    Please consider 90 day supplies to promote better adherence     Procedures: No procedures performed  Clinical Data: No additional findings.  ROS:  All other systems negative, except as noted in the HPI. Review of Systems  Objective: Vital Signs: Ht 5\' 5"  (1.651 m)   Wt 204 lb 6.4 oz (92.7 kg)   BMI 34.01 kg/m   Specialty Comments:  No specialty comments available.  PMFS History: Patient Active Problem List   Diagnosis Date Noted  . Ingrowing nail, right great toe 12/23/2016  . Achilles tendonitis, bilateral 05/13/2016  . Achilles tendon contracture, bilateral 05/13/2016  . Hypertension 08/06/2010  . Hyperlipidemia LDL goal <100 08/06/2010  . Obesity (BMI 30-39.9) 08/06/2010   Past Medical History:  Diagnosis Date  . Abnormal EKG   . Abnormal pap    s/p cryotherapy  . Diverticulosis   . Hyperlipidemia   . Hypertension   . S/P colonoscopic polypectomy     Family History  Problem Relation Age of Onset  . Alzheimer's disease Mother   . Hyperlipidemia Mother   .  Other Father        died of gunshot wound  . Osteoporosis Sister   . Osteoporosis Sister   . ALS Maternal Aunt   . ALS Maternal Uncle   . Diabetes Paternal Aunt   . Cancer Maternal Grandfather        lung cancer  . Cancer Paternal Grandmother        ?type  . Heart disease Neg Hx     Past Surgical History:  Procedure Laterality Date  . COLONOSCOPY  03/18/11; 2000   Dr. Amedeo Plenty; diverticulosis in sigmoid colon  . crysurgery  age 34   for abnormal pap  . LAMINECTOMY  03/2002   Social History   Occupational History  . Occupation: quality control    Employer: [employer]  Tobacco Use  . Smoking status: Never Smoker  . Smokeless tobacco: Never Used  Substance and Sexual Activity  . Alcohol  use: Yes    Comment: glass of wine 1-2 times per week.  . Drug use: No  . Sexual activity: Yes    Partners: Male    Comment: postmenopausal

## 2017-12-01 ENCOUNTER — Ambulatory Visit (INDEPENDENT_AMBULATORY_CARE_PROVIDER_SITE_OTHER): Payer: 59 | Admitting: Physician Assistant

## 2017-12-01 ENCOUNTER — Encounter (INDEPENDENT_AMBULATORY_CARE_PROVIDER_SITE_OTHER): Payer: Self-pay | Admitting: Orthopedic Surgery

## 2017-12-01 VITALS — Ht 65.0 in | Wt 204.4 lb

## 2017-12-01 DIAGNOSIS — M1711 Unilateral primary osteoarthritis, right knee: Secondary | ICD-10-CM | POA: Diagnosis not present

## 2017-12-02 ENCOUNTER — Encounter (INDEPENDENT_AMBULATORY_CARE_PROVIDER_SITE_OTHER): Payer: Self-pay | Admitting: Physician Assistant

## 2017-12-02 NOTE — Progress Notes (Signed)
Office Visit Note   Patient: Amber Church           Date of Birth: 12-Mar-1955           MRN: [id number] Visit Date: 12/01/2017              Requested by: Denita Lung, MD [address] [place], [place] [address] PCP: Denita Lung, MD  Chief Complaint  Patient presents with  . Right Knee - Pain, Follow-up    Euflexxa; 2nd Series Inj Right Knee      HPI: The patient is a 62 year old female who is seen for her second Euflexxa injection to the right knee.  She reports that she did get some early relief of her discomfort in the right knee following the first injection.  She does report some low back pain and hip pain and some Achilles tendinitis symptoms and we discussed Achilles stretching.  Assessment & Plan: Visit Diagnoses:  1. Primary osteoarthritis of right knee     Plan: The right knee was sterilely prepped and anesthetized with 1% Xylocaine x1 mL and then injected with Euflexxa without difficulty.  The patient tolerated this well.  She will follow-up next week for the third in her series of 3 injections.  Follow-Up Instructions: Return in about 1 week (around 12/08/2017).   Ortho Exam  Patient is alert, oriented, no adenopathy, well-dressed, normal affect, normal respiratory effort. The right knee is mildly effused.  She has medial joint line tenderness still greater than laterally.  She has full extension and at least 100 degrees of flexion with some pain on end range.  There is no instability of the knee with varus valgus stress.  Imaging: No results found. No images are attached to the encounter.  Labs: No results found for: HGBA1C, ESRSEDRATE, CRP, LABURIC, REPTSTATUS, GRAMSTAIN, CULT, LABORGA   Lab Results  Component Value Date   ALBUMIN 4.7 06/03/2017   ALBUMIN 4.0 01/15/2016   ALBUMIN 4.2 06/17/2014    Body mass index is 34.01 kg/m.  Orders:  No orders of the defined types were placed in this encounter.  No orders of the defined  types were placed in this encounter.    Procedures: No procedures performed  Clinical Data: No additional findings.  ROS:  All other systems negative, except as noted in the HPI. Review of Systems  Objective: Vital Signs: Ht 5\' 5"  (1.651 m)   Wt 204 lb 6.4 oz (92.7 kg)   BMI 34.01 kg/m   Specialty Comments:  No specialty comments available.  PMFS History: Patient Active Problem List   Diagnosis Date Noted  . Ingrowing nail, right great toe 12/23/2016  . Achilles tendonitis, bilateral 05/13/2016  . Achilles tendon contracture, bilateral 05/13/2016  . Hypertension 08/06/2010  . Hyperlipidemia LDL goal <100 08/06/2010  . Obesity (BMI 30-39.9) 08/06/2010   Past Medical History:  Diagnosis Date  . Abnormal EKG   . Abnormal pap    s/p cryotherapy  . Diverticulosis   . Hyperlipidemia   . Hypertension   . S/P colonoscopic polypectomy     Family History  Problem Relation Age of Onset  . Alzheimer's disease Mother   . Hyperlipidemia Mother   . Other Father        died of gunshot wound  . Osteoporosis Sister   . Osteoporosis Sister   . ALS Maternal Aunt   . ALS Maternal Uncle   . Diabetes Paternal Aunt   . Cancer Maternal Grandfather  lung cancer  . Cancer Paternal Grandmother        ?type  . Heart disease Neg Hx     Past Surgical History:  Procedure Laterality Date  . COLONOSCOPY  03/18/11; 2000   Dr. Amedeo Plenty; diverticulosis in sigmoid colon  . crysurgery  age 78   for abnormal pap  . LAMINECTOMY  03/2002   Social History   Occupational History  . Occupation: quality control    Employer: [employer]  Tobacco Use  . Smoking status: Never Smoker  . Smokeless tobacco: Never Used  Substance and Sexual Activity  . Alcohol use: Yes    Comment: glass of wine 1-2 times per week.  . Drug use: No  . Sexual activity: Yes    Partners: Male    Comment: postmenopausal

## 2017-12-08 ENCOUNTER — Encounter (INDEPENDENT_AMBULATORY_CARE_PROVIDER_SITE_OTHER): Payer: Self-pay | Admitting: Orthopedic Surgery

## 2017-12-08 ENCOUNTER — Ambulatory Visit (INDEPENDENT_AMBULATORY_CARE_PROVIDER_SITE_OTHER): Payer: 59 | Admitting: Physician Assistant

## 2017-12-08 VITALS — Ht 65.0 in | Wt 204.0 lb

## 2017-12-08 DIAGNOSIS — M5442 Lumbago with sciatica, left side: Secondary | ICD-10-CM | POA: Diagnosis not present

## 2017-12-08 DIAGNOSIS — M1711 Unilateral primary osteoarthritis, right knee: Secondary | ICD-10-CM | POA: Diagnosis not present

## 2017-12-08 MED ORDER — PREDNISONE 10 MG PO TABS: 20.0000 mg | ORAL_TABLET | Freq: Every day | ORAL | 0 refills | Status: DC

## 2017-12-08 NOTE — Progress Notes (Addendum)
Office Visit Note   Patient: Amber Church           Date of Birth: [birth year]           MRN: [id number] Visit Date: 12/08/2017              Requested by: Denita Lung, MD [address] [place], [place] [address] PCP: Denita Lung, MD  Chief Complaint  Patient presents with  . Right Knee - Follow-up    #3 Euflexxa injection       HPI: The patient is a 62 year old female who seen for her third Euflexxa injection to the right knee.  She reports the injections have helped the right knee and reports good improvement in her pain over the right knee.  She also reports that over the past month she has noticed some worsening low back pain particularly on the left side and she has sciatic symptoms radiating into the left thigh and calf.  She would like something to help with this as well.  Advil and Voltaren have not helped.  She is also tried Celebrex and this is not helped.  She is tried some stretching and does not notice a lot of improvement with this either.  Assessment & Plan: Visit Diagnoses:  1. Primary osteoarthritis of right knee   2. Acute left-sided low back pain with left-sided sciatica     Plan: The right knee was sterilely prepped and anesthetized with 1% Xylocaine x1 mL and then injected with Euflexxa without difficulty.  The patient tolerated this well.  This completes her series of 3 Euflexxa injections.  She is having a lot of symptoms with her back particularly left-sided sciatic symptoms and we discussed trying a prednisone course and she is willing to do this.  She is going to start on prednisone 20 mg daily in the morning for a week and then decrease to 10 mg daily in the morning for a week and then 10 mg every other day for a week and then discontinue.  She will follow-up in 4 weeks or sooner should she have any difficulties in the interim.  Follow-Up Instructions: Return in about 4 weeks (around 01/05/2018).   Ortho Exam  Patient is alert, oriented,  no adenopathy, well-dressed, normal affect, normal respiratory effort. The right knee was sterilely prepped and anesthetized with Xylocaine and then injected with Euflexxa.  She did have a small amount of effusion but good motion with full extension and 115 degrees of flexion.  She has no knee instability.  Imaging: No results found. No images are attached to the encounter.  Labs: No results found for: HGBA1C, ESRSEDRATE, CRP, LABURIC, REPTSTATUS, GRAMSTAIN, CULT, LABORGA   Lab Results  Component Value Date   ALBUMIN 4.7 06/03/2017   ALBUMIN 4.0 01/15/2016   ALBUMIN 4.2 06/17/2014    Body mass index is 33.95 kg/m.  Orders:  No orders of the defined types were placed in this encounter.  Meds ordered this encounter  Medications  . predniSONE (DELTASONE) 10 MG tablet    Sig: Take 2 tablets (20 mg total) by mouth daily with breakfast.    Dispense:  50 tablet    Refill:  0    20 mg daily for 1 week, then decrease to 10 mg Daily x 1 week, then decrease to 10 mg every other day x 1 week and then stop     Procedures: No procedures performed  Clinical Data: No additional findings.  ROS:  All other  systems negative, except as noted in the HPI. Review of Systems  Objective: Vital Signs: Ht 5\' 5"  (1.651 m)   Wt 204 lb (92.5 kg)   BMI 33.95 kg/m   Specialty Comments:  No specialty comments available.  PMFS History: Patient Active Problem List   Diagnosis Date Noted  . Ingrowing nail, right great toe 12/23/2016  . Achilles tendonitis, bilateral 05/13/2016  . Achilles tendon contracture, bilateral 05/13/2016  . Hypertension 08/06/2010  . Hyperlipidemia LDL goal <100 08/06/2010  . Obesity (BMI 30-39.9) 08/06/2010   Past Medical History:  Diagnosis Date  . Abnormal EKG   . Abnormal pap    s/p cryotherapy  . Diverticulosis   . Hyperlipidemia   . Hypertension   . S/P colonoscopic polypectomy     Family History  Problem Relation Age of Onset  . Alzheimer's  disease Mother   . Hyperlipidemia Mother   . Other Father        died of gunshot wound  . Osteoporosis Sister   . Osteoporosis Sister   . ALS Maternal Aunt   . ALS Maternal Uncle   . Diabetes Paternal Aunt   . Cancer Maternal Grandfather        lung cancer  . Cancer Paternal Grandmother        ?type  . Heart disease Neg Hx     Past Surgical History:  Procedure Laterality Date  . COLONOSCOPY  03/18/11; 2000   Dr. Amedeo Plenty; diverticulosis in sigmoid colon  . crysurgery  age 64   for abnormal pap  . LAMINECTOMY  03/2002   Social History   Occupational History  . Occupation: quality control    Employer: [employer]  Tobacco Use  . Smoking status: Never Smoker  . Smokeless tobacco: Never Used  Substance and Sexual Activity  . Alcohol use: Yes    Comment: glass of wine 1-2 times per week.  . Drug use: No  . Sexual activity: Yes    Partners: Male    Comment: postmenopausal

## 2017-12-09 ENCOUNTER — Encounter (INDEPENDENT_AMBULATORY_CARE_PROVIDER_SITE_OTHER): Payer: Self-pay | Admitting: Physician Assistant

## 2018-01-08 ENCOUNTER — Encounter (INDEPENDENT_AMBULATORY_CARE_PROVIDER_SITE_OTHER): Payer: Self-pay | Admitting: Orthopedic Surgery

## 2018-01-08 ENCOUNTER — Ambulatory Visit (INDEPENDENT_AMBULATORY_CARE_PROVIDER_SITE_OTHER): Payer: 59 | Admitting: Physician Assistant

## 2018-01-08 VITALS — Ht 65.0 in | Wt 204.0 lb

## 2018-01-08 DIAGNOSIS — M1711 Unilateral primary osteoarthritis, right knee: Secondary | ICD-10-CM

## 2018-01-08 DIAGNOSIS — M5442 Lumbago with sciatica, left side: Secondary | ICD-10-CM

## 2018-01-08 MED ORDER — PREDNISONE 10 MG PO TABS: 20.0000 mg | ORAL_TABLET | Freq: Every day | ORAL | 2 refills | Status: DC

## 2018-01-08 NOTE — Progress Notes (Signed)
Office Visit Note   Patient: Amber Church           Date of Birth: Jul 14, 1955           MRN: [id number] Visit Date: 01/08/2018              Requested by: Denita Lung, MD [address] [place], [place] [address] PCP: Denita Lung, MD  Chief Complaint  Patient presents with  . Right Knee - Follow-up    S/p Eufflexa series injection       HPI: The patient is a 63 year old woman who is seen for follow-up of her low back pain with sciatic symptoms over the left side.  She recently underwent right knee Euflexxa series and has done well following this and is pleased with how the right knee is doing.  She was started on a steroid taper and reports when she was on the 20 mg once daily that this seemed to help a lot but as she tapered her symptoms returned.  We discussed that she may need further imaging such as an MRI scan but she would like to hold off for at least another month for financial concerns.  She would like to continue on some prednisone and we agreed that she could do this for another month.  Assessment & Plan: Visit Diagnoses:  1. Acute left-sided low back pain with left-sided sciatica   2. Primary osteoarthritis of right knee     Plan: She is going to resume prednisone for the next month at a low dose to help with her left low back pain and sciatic symptoms.  She is going to follow-up here in 4 weeks.  We did discuss that she may need further imaging such as an MRI scan at some point to further delineate her back issues and she may need referral to Dr. Ernestina Patches for lumbar epidural steroid injection as well.  She will follow-up here in 4 weeks.  Follow-Up Instructions: Return in about 4 weeks (around 02/05/2018).   Ortho Exam  Patient is alert, oriented, no adenopathy, well-dressed, normal affect, normal respiratory effort. The right knee is doing well.  There is no instability.  She has excellent range of motion now.  She has some mild residual  effusion. Left straight leg raise is negative today but she continues to have complaints of pain over the left buttock and over the low back which radiates down into the thigh and knee at times.  She is neurovascularly intact today. Imaging: No results found. No images are attached to the encounter.  Labs: No results found for: HGBA1C, ESRSEDRATE, CRP, LABURIC, REPTSTATUS, GRAMSTAIN, CULT, LABORGA   Lab Results  Component Value Date   ALBUMIN 4.7 06/03/2017   ALBUMIN 4.0 01/15/2016   ALBUMIN 4.2 06/17/2014    Body mass index is 33.95 kg/m.  Orders:  No orders of the defined types were placed in this encounter.  Meds ordered this encounter  Medications  . predniSONE (DELTASONE) 10 MG tablet    Sig: Take 2 tablets (20 mg total) by mouth daily with breakfast.    Dispense:  60 tablet    Refill:  2    20 mg daily for 1 week, then decrease to 10 mg Daily x 1 week, then decrease to 10 mg every other day x 1 week and then stop     Procedures: No procedures performed  Clinical Data: No additional findings.  ROS:  All other systems negative, except as noted in  the HPI. Review of Systems  Objective: Vital Signs: Ht 5\' 5"  (1.651 m)   Wt 204 lb (92.5 kg)   BMI 33.95 kg/m   Specialty Comments:  No specialty comments available.  PMFS History: Patient Active Problem List   Diagnosis Date Noted  . Ingrowing nail, right great toe 12/23/2016  . Achilles tendonitis, bilateral 05/13/2016  . Achilles tendon contracture, bilateral 05/13/2016  . Hypertension 08/06/2010  . Hyperlipidemia LDL goal <100 08/06/2010  . Obesity (BMI 30-39.9) 08/06/2010   Past Medical History:  Diagnosis Date  . Abnormal EKG   . Abnormal pap    s/p cryotherapy  . Diverticulosis   . Hyperlipidemia   . Hypertension   . S/P colonoscopic polypectomy     Family History  Problem Relation Age of Onset  . Alzheimer's disease Mother   . Hyperlipidemia Mother   . Other Father        died of  gunshot wound  . Osteoporosis Sister   . Osteoporosis Sister   . ALS Maternal Aunt   . ALS Maternal Uncle   . Diabetes Paternal Aunt   . Cancer Maternal Grandfather        lung cancer  . Cancer Paternal Grandmother        ?type  . Heart disease Neg Hx     Past Surgical History:  Procedure Laterality Date  . COLONOSCOPY  03/18/11; 2000   Dr. Amedeo Plenty; diverticulosis in sigmoid colon  . crysurgery  age 12   for abnormal pap  . LAMINECTOMY  03/2002   Social History   Occupational History  . Occupation: quality control    Employer: [employer]  Tobacco Use  . Smoking status: Never Smoker  . Smokeless tobacco: Never Used  Substance and Sexual Activity  . Alcohol use: Yes    Comment: glass of wine 1-2 times per week.  . Drug use: No  . Sexual activity: Yes    Partners: Male    Comment: postmenopausal

## 2018-01-09 ENCOUNTER — Telehealth (INDEPENDENT_AMBULATORY_CARE_PROVIDER_SITE_OTHER): Payer: Self-pay

## 2018-01-09 ENCOUNTER — Encounter (INDEPENDENT_AMBULATORY_CARE_PROVIDER_SITE_OTHER): Payer: Self-pay | Admitting: Physician Assistant

## 2018-01-09 NOTE — Telephone Encounter (Signed)
Done

## 2018-01-09 NOTE — Telephone Encounter (Signed)
[facility] in [place] called stating that they need clarification on Rx for Prednisone.  Cb# is [phone].  Please advise.  Thank you.

## 2018-01-09 NOTE — Telephone Encounter (Signed)
Please advise 

## 2018-01-09 NOTE — Telephone Encounter (Signed)
Called pharmacy and clarified prescription for prednisone.

## 2018-01-21 ENCOUNTER — Ambulatory Visit (INDEPENDENT_AMBULATORY_CARE_PROVIDER_SITE_OTHER): Payer: 59 | Admitting: Medical

## 2018-01-21 VITALS — BP 134/80 | HR 78 | Temp 97.6°F | Resp 16 | Ht 64.0 in | Wt 203.8 lb

## 2018-01-21 DIAGNOSIS — Z79899 Other long term (current) drug therapy: Secondary | ICD-10-CM

## 2018-01-21 DIAGNOSIS — R52 Pain, unspecified: Secondary | ICD-10-CM | POA: Diagnosis not present

## 2018-01-21 DIAGNOSIS — R05 Cough: Secondary | ICD-10-CM | POA: Diagnosis not present

## 2018-01-21 DIAGNOSIS — R6889 Other general symptoms and signs: Secondary | ICD-10-CM | POA: Diagnosis not present

## 2018-01-21 DIAGNOSIS — R059 Cough, unspecified: Secondary | ICD-10-CM

## 2018-01-21 LAB — POCT INFLUENZA A/B
Influenza A, POC: NEGATIVE
Influenza B, POC: NEGATIVE

## 2018-01-21 MED ORDER — AZITHROMYCIN 250 MG PO TABS: ORAL_TABLET | ORAL | 0 refills | Status: DC

## 2018-01-21 MED ORDER — PROMETHAZINE-DM 6.25-15 MG/5ML PO SYRP: 5.0000 mL | ORAL_SOLUTION | Freq: Four times a day (QID) | ORAL | 0 refills | Status: DC | PRN

## 2018-01-21 NOTE — Patient Instructions (Signed)
respiratory tract infection  Recommendations:  Rest  Hydrate well throughout the day as fluids helps get rid of secretions  Begin Zpak 5 day antibiotic  You can use Promethazine DM cough syrup every 6 hours as needed  symptoms should gradually improve  If worse or not improving within the next 3-4 days, then recheck   Make sure you talk to your orthopedist about long term use of prednisone, risks of medication, screening for osteoporosis

## 2018-01-21 NOTE — Progress Notes (Signed)
Subjective: Chief Complaint  Patient presents with  . cough    cough, nausea, chills, headache, fatigued no appetite X Saturday   Here for illness.  Started 4-5 days ago with cough, nausea, chills, headache, fatigue, no appetite, light headed, weak feeling.  No sore throat.  Cough is nonproductive.   Some SOB, no wheezing.  Came on abruptly with chills.  Coughing all night, sweated all night the second night.   Using mucinex, aspiring a few nights for the headaches.  Has had some sick contacts at work, one mentioned cough.    Of note, on long term prednisone for joint pains.   Seeing orthopedist. Has had gel injections in knees, has both knee and ankle pains.   No other aggravating or relieving factors. No other complaint.   Past Medical History:  Diagnosis Date  . Abnormal EKG   . Abnormal pap    s/p cryotherapy  . Diverticulosis   . Hyperlipidemia   . Hypertension   . S/P colonoscopic polypectomy    Current Outpatient Medications on File Prior to Visit  Medication Sig Dispense Refill  . diclofenac sodium (VOLTAREN) 1 % GEL APPLY 2 GRAMS TOPICALLY 4  TIMES DAILY AS NEEDED 400 g 3  . lisinopril-hydrochlorothiazide (PRINZIDE,ZESTORETIC) 10-12.5 MG tablet Take 1 tablet by mouth daily. 90 tablet 3  . pravastatin (PRAVACHOL) 40 MG tablet Take 1 tablet (40 mg total) by mouth daily. 90 tablet 3  . predniSONE (DELTASONE) 10 MG tablet Take 2 tablets (20 mg total) by mouth daily with breakfast. 60 tablet 2  . celecoxib (CELEBREX) 200 MG capsule TAKE 1 CAPSULE BY MOUTH TWO TIMES DAILY (Patient not taking: Reported on 01/21/2018) 180 capsule 1  . diclofenac (VOLTAREN) 75 MG EC tablet Take 1 tablet (75 mg total) by mouth 2 (two) times daily. (Patient not taking: Reported on 01/21/2018) 60 tablet 1  . HYDROcodone-acetaminophen (NORCO/VICODIN) 5-325 MG tablet Take 1 tablet by mouth 2 (two) times daily as needed for moderate pain. (Patient not taking: Reported on 01/21/2018) 30 tablet 0  .  HYDROcodone-acetaminophen (NORCO/VICODIN) 5-325 MG tablet Take 1 tablet 2 (two) times daily as needed by mouth for moderate pain. (Patient not taking: Reported on 01/21/2018) 20 tablet 0  . ibuprofen (ADVIL,MOTRIN) 800 MG tablet Take 1 tablet (800 mg total) by mouth every 8 (eight) hours as needed (as needed). (Patient not taking: Reported on 01/21/2018) 90 tablet 0   No current facility-administered medications on file prior to visit.    ROS as in subjective   Objective: BP 134/80   Pulse 78   Temp 97.6 F (36.4 C) (Oral)   Resp 16   Ht 5\' 4"  (1.626 m)   Wt 203 lb 12.8 oz (92.4 kg)   SpO2 96%   BMI 34.98 kg/m   General appearance: Alert, WD/WN, no distress,somewhat ill appearing                             Skin: warm, no rash, no diaphoresis                           Head: no sinus tenderness                            Eyes: conjunctiva normal, corneas clear, PERRLA  Ears: pearly TMs, external ear canals normal                          Nose: septum midline, turbinates swollen, with erythema and clear discharge             Mouth/throat: MMM, tongue normal, mild pharyngeal erythema                           Neck: supple, no adenopathy, no thyromegaly, non tender                          Heart: RRR, normal S1, S2, no murmurs                         Lungs: +bronchial breath sounds, +scattered rhonchi, no wheezes, no rales                Extremities: no edema, non tender       Assessment: Encounter Diagnoses  Name Primary?  . Flu-like symptoms Yes  . Cough   . Body aches   . High risk medication use      Plan Discussed symptoms, exam findings . Flu negative.  counseled on recommendations as below  Patient Instructions  respiratory tract infection  Recommendations:  Rest  Hydrate well throughout the day as fluids helps get rid of secretions  Begin Zpak 5 day antibiotic  You can use Promethazine DM cough syrup every 6 hours as  needed  symptoms should gradually improve  If worse or not improving within the next 3-4 days, then recheck   Make sure you talk to your orthopedist about long term use of prednisone, risks of medication, screening for osteoporosis     Camilia was seen today for cough.  Diagnoses and all orders for this visit:  Flu-like symptoms -     Influenza A/B  Cough  Body aches  High risk medication use

## 2018-02-09 ENCOUNTER — Encounter (INDEPENDENT_AMBULATORY_CARE_PROVIDER_SITE_OTHER): Payer: Self-pay | Admitting: Orthopedic Surgery

## 2018-02-09 ENCOUNTER — Ambulatory Visit (INDEPENDENT_AMBULATORY_CARE_PROVIDER_SITE_OTHER): Payer: 59 | Admitting: Physician Assistant

## 2018-02-09 ENCOUNTER — Ambulatory Visit (INDEPENDENT_AMBULATORY_CARE_PROVIDER_SITE_OTHER): Payer: Self-pay

## 2018-02-09 VITALS — Ht 64.0 in | Wt 203.8 lb

## 2018-02-09 DIAGNOSIS — M25571 Pain in right ankle and joints of right foot: Secondary | ICD-10-CM

## 2018-02-09 DIAGNOSIS — M5442 Lumbago with sciatica, left side: Secondary | ICD-10-CM

## 2018-02-09 DIAGNOSIS — M1711 Unilateral primary osteoarthritis, right knee: Secondary | ICD-10-CM | POA: Diagnosis not present

## 2018-02-09 NOTE — Progress Notes (Signed)
Office Visit Note   Patient: Amber Church           Date of Birth: 03-Jan-1956           MRN: [id number] Visit Date: 02/09/2018              Requested by: Denita Lung, MD [address] [place], [place] [address] PCP: Denita Lung, MD  Chief Complaint  Patient presents with  . Lower Back - Pain, Follow-up      HPI: The patient is a 63 year old woman who is seen for follow-up of her low back pain with sciatic symptoms over the left side.  She reports that overall her sciatic symptoms are improved but she is still taking some prednisone but this is helping.  She does note increased pain with sitting for longer periods as well as when riding in the car for longer periods.  Overall she feels like the pain is improved and she does not currently want to proceed with an MRI scan but we discussed that at some point we may need to get an MRI and possible referral to Dr. Ernestina Patches for lumbar epidural steroid injection.  She reports her right knee is doing well after the Euflexxa series and she is pleased with how this feels.  She is now having some pain however over her right ankle.  She is having some pain over the subtalar area as well as over the Achilles tendon.  She would like a steroid injection today for this.  Assessment & Plan: Visit Diagnoses:  1. Pain in right ankle and joints of right foot   2. Acute left-sided low back pain with left-sided sciatica   3. Unilateral primary osteoarthritis, right knee     Plan: After informed consent the right Achilles retrocalcaneal bursa area was injected with a combination of lidocaine and Depo-Medrol without difficulty and the patient tolerated this well.  She was cautioned to not jog or run on the leg for approximately 1 week.  She can resume Achilles stretching next week.  She can otherwise do activities to tolerance.  She is can a follow-up here in about 4 weeks for recheck or sooner should she have difficulties in the  interim.  Follow-Up Instructions: Return in about 4 weeks (around 03/09/2018).   Ortho Exam  Patient is alert, oriented, no adenopathy, well-dressed, normal affect, normal respiratory effort. The patient is ambulatory without an assistive device.  She has excellent range of motion of the right knee with only mild residual effusion.  She is tender over the distal insertion of the Achilles tendon.  She underwent steroid injection just dorsal to the tendon and tolerated this well.  She has ankle range of motion shoe which shows limited dorsiflexion but good plantarflexion.  She was instructed in how to do Achilles stretching and should continue to do this starting next week.  Imaging: No results found. No images are attached to the encounter.  Labs: No results found for: HGBA1C, ESRSEDRATE, CRP, LABURIC, REPTSTATUS, GRAMSTAIN, CULT, LABORGA   Lab Results  Component Value Date   ALBUMIN 4.7 06/03/2017   ALBUMIN 4.0 01/15/2016   ALBUMIN 4.2 06/17/2014    Body mass index is 34.98 kg/m.  Orders:  Orders Placed This Encounter  Procedures  . XR Ankle Complete Right   No orders of the defined types were placed in this encounter.    Procedures: Medium Joint Inj: R ankle (Into the retrocalcaneal bursa region) on 02/11/2018 2:51 PM Indications: pain  and diagnostic evaluation Details: 22 G 1.5 in needle, posterior approach Medications: 2 mL lidocaine 1 %; 40 mg methylPREDNISolone acetate 40 MG/ML Outcome: tolerated well, no immediate complications Procedure, treatment alternatives, risks and benefits explained, specific risks discussed. Consent was given by the patient. Immediately prior to procedure a time out was called to verify the correct patient, procedure, equipment, support staff and site/side marked as required. Patient was prepped and draped in the usual sterile fashion.      Clinical Data: No additional findings.  ROS:  All other systems negative, except as noted in the  HPI. Review of Systems  Objective: Vital Signs: Ht 5\' 4"  (1.626 m)   Wt 203 lb 12.8 oz (92.4 kg)   BMI 34.98 kg/m   Specialty Comments:  No specialty comments available.  PMFS History: Patient Active Problem List   Diagnosis Date Noted  . Ingrowing nail, right great toe 12/23/2016  . Achilles tendonitis, bilateral 05/13/2016  . Achilles tendon contracture, bilateral 05/13/2016  . Hypertension 08/06/2010  . Hyperlipidemia LDL goal <100 08/06/2010  . Obesity (BMI 30-39.9) 08/06/2010   Past Medical History:  Diagnosis Date  . Abnormal EKG   . Abnormal pap    s/p cryotherapy  . Diverticulosis   . Hyperlipidemia   . Hypertension   . S/P colonoscopic polypectomy     Family History  Problem Relation Age of Onset  . Alzheimer's disease Mother   . Hyperlipidemia Mother   . Other Father        died of gunshot wound  . Osteoporosis Sister   . Osteoporosis Sister   . ALS Maternal Aunt   . ALS Maternal Uncle   . Diabetes Paternal Aunt   . Cancer Maternal Grandfather        lung cancer  . Cancer Paternal Grandmother        ?type  . Heart disease Neg Hx     Past Surgical History:  Procedure Laterality Date  . COLONOSCOPY  03/18/11; 2000   Dr. Amedeo Plenty; diverticulosis in sigmoid colon  . crysurgery  age 63   for abnormal pap  . LAMINECTOMY  03/2002   Social History   Occupational History  . Occupation: quality control    Employer: [employer]  Tobacco Use  . Smoking status: Never Smoker  . Smokeless tobacco: Never Used  Substance and Sexual Activity  . Alcohol use: Yes    Comment: glass of wine 1-2 times per week.  . Drug use: No  . Sexual activity: Yes    Partners: Male    Comment: postmenopausal

## 2018-02-11 ENCOUNTER — Encounter (INDEPENDENT_AMBULATORY_CARE_PROVIDER_SITE_OTHER): Payer: Self-pay | Admitting: Physician Assistant

## 2018-02-11 DIAGNOSIS — M25571 Pain in right ankle and joints of right foot: Secondary | ICD-10-CM | POA: Diagnosis not present

## 2018-02-11 MED ORDER — LIDOCAINE HCL 1 % IJ SOLN
2.0000 mL | INTRAMUSCULAR | Status: AC | PRN
  Administered 2018-02-11: 2 mL

## 2018-02-11 MED ORDER — METHYLPREDNISOLONE ACETATE 40 MG/ML IJ SUSP
40.0000 mg | INTRAMUSCULAR | Status: AC | PRN
  Administered 2018-02-11: 40 mg via INTRA_ARTICULAR

## 2018-03-09 ENCOUNTER — Ambulatory Visit (INDEPENDENT_AMBULATORY_CARE_PROVIDER_SITE_OTHER): Payer: 59 | Admitting: Physician Assistant

## 2018-03-09 ENCOUNTER — Ambulatory Visit (INDEPENDENT_AMBULATORY_CARE_PROVIDER_SITE_OTHER): Payer: 59

## 2018-03-09 ENCOUNTER — Encounter (INDEPENDENT_AMBULATORY_CARE_PROVIDER_SITE_OTHER): Payer: Self-pay | Admitting: Physician Assistant

## 2018-03-09 VITALS — Ht 64.0 in | Wt 203.8 lb

## 2018-03-09 DIAGNOSIS — M5410 Radiculopathy, site unspecified: Secondary | ICD-10-CM

## 2018-03-09 NOTE — Progress Notes (Signed)
Office Visit Note   Patient: Amber Church           Date of Birth: September 30, 1955 (63 years old)           MRN: [id number] Visit Date: 03/09/2018              Requested by: Denita Lung, MD [address] [place], [place] [address] PCP: Denita Lung, MD  Chief Complaint  Patient presents with  . Right Ankle - Pain, Follow-up      HPI: Patient presents with worsening of her right-sided radicular symptoms.  She states she did have temporary relief with taking prednisone but she states she now has increased pain with driving for 10 minutes with pain radiating down the right buttocks and lateral aspect of right thigh.  Assessment & Plan: Visit Diagnoses:  1. Radicular pain of right lower extremity     Plan: Will order an MRI scan to further evaluate her lumbar spine with anticipation of epidural steroid injections to relieve her symptoms.  Follow-Up Instructions: Return if symptoms worsen or fail to improve.   Ortho Exam  Patient is alert, oriented, no adenopathy, well-dressed, normal affect, normal respiratory effort. Examination patient has a negative straight leg raise bilaterally no pain with range of motion of the hip knee or ankles.  She has good motor strength in both lower extremities with no focal motor weakness radicular pain from the lumbar spine down the right buttocks and the lateral aspect of the right thigh.  Imaging: Xr Lumbar Spine 2-3 Views  Result Date: 03/09/2018 2 view radiographs of the lumbar spine shows degenerative disc disease with grade 1 spondylolisthesis at L3-4 with a pars defect.  There is joint space narrowing anterior osteophytic bone spurs worse in the thoracic spine.  No images are attached to the encounter.  Labs: No results found for: HGBA1C, ESRSEDRATE, CRP, LABURIC, REPTSTATUS, GRAMSTAIN, CULT, LABORGA   Lab Results  Component Value Date   ALBUMIN 4.7 06/03/2017   ALBUMIN 4.0 01/15/2016   ALBUMIN 4.2 06/17/2014    Body mass index  is 34.98 kg/m.  Orders:  Orders Placed This Encounter  Procedures  . MR Lumbar Spine w/o contrast  . XR Lumbar Spine 2-3 Views   No orders of the defined types were placed in this encounter.    Procedures: No procedures performed  Clinical Data: No additional findings.  ROS:  All other systems negative, except as noted in the HPI. Review of Systems  Objective: Vital Signs: Ht 5\' 4"  (1.626 m)   Wt 203 lb 12.8 oz (92.4 kg)   BMI 34.98 kg/m   Specialty Comments:  No specialty comments available.  PMFS History: Patient Active Problem List   Diagnosis Date Noted  . Ingrowing nail, right great toe 12/23/2016  . Achilles tendonitis, bilateral 05/13/2016  . Achilles tendon contracture, bilateral 05/13/2016  . Hypertension 08/06/2010  . Hyperlipidemia LDL goal <100 08/06/2010  . Obesity (BMI 30-39.9) 08/06/2010   Past Medical History:  Diagnosis Date  . Abnormal EKG   . Abnormal pap    s/p cryotherapy  . Diverticulosis   . Hyperlipidemia   . Hypertension   . S/P colonoscopic polypectomy     Family History  Problem Relation Age of Onset  . Alzheimer's disease Mother   . Hyperlipidemia Mother   . Other Father        died of gunshot wound  . Osteoporosis Sister   . Osteoporosis Sister   . ALS Maternal Aunt   .  ALS Maternal Uncle   . Diabetes Paternal Aunt   . Cancer Maternal Grandfather        lung cancer  . Cancer Paternal Grandmother        ?type  . Heart disease Neg Hx     Past Surgical History:  Procedure Laterality Date  . COLONOSCOPY  03/18/11; 2000   Dr. Amedeo Plenty; diverticulosis in sigmoid colon  . crysurgery  age 32   for abnormal pap  . LAMINECTOMY  03/2002   Social History   Occupational History  . Occupation: quality control    Employer: [employer]  Tobacco Use  . Smoking status: Never Smoker  . Smokeless tobacco: Never Used  Substance and Sexual Activity  . Alcohol use: Yes    Comment: glass of wine 1-2 times per week.  . Drug  use: No  . Sexual activity: Yes    Partners: Male    Comment: postmenopausal

## 2018-03-10 ENCOUNTER — Ambulatory Visit (INDEPENDENT_AMBULATORY_CARE_PROVIDER_SITE_OTHER): Payer: 59 | Admitting: Physician Assistant

## 2018-03-30 ENCOUNTER — Other Ambulatory Visit: Payer: Self-pay

## 2018-03-30 ENCOUNTER — Other Ambulatory Visit (INDEPENDENT_AMBULATORY_CARE_PROVIDER_SITE_OTHER): Payer: Self-pay

## 2018-03-30 ENCOUNTER — Ambulatory Visit
Admission: RE | Admit: 2018-03-30 | Discharge: 2018-03-30 | Disposition: A | Discharge status: Home / Self Care | Payer: 59 | Source: Ambulatory Visit | Attending: Orthopedic Surgery | Admitting: Orthopedic Surgery

## 2018-03-30 DIAGNOSIS — M5410 Radiculopathy, site unspecified: Secondary | ICD-10-CM

## 2018-03-30 DIAGNOSIS — M5416 Radiculopathy, lumbar region: Secondary | ICD-10-CM | POA: Diagnosis not present

## 2018-04-02 ENCOUNTER — Ambulatory Visit (INDEPENDENT_AMBULATORY_CARE_PROVIDER_SITE_OTHER): Payer: 59 | Admitting: Orthopedic Surgery

## 2018-04-03 ENCOUNTER — Telehealth (INDEPENDENT_AMBULATORY_CARE_PROVIDER_SITE_OTHER): Payer: Self-pay

## 2018-04-03 NOTE — Telephone Encounter (Signed)
Pt was called and pre-screened for COVID-19 and answered no to all questions. 

## 2018-04-06 ENCOUNTER — Encounter (INDEPENDENT_AMBULATORY_CARE_PROVIDER_SITE_OTHER): Payer: Self-pay | Admitting: Orthopedic Surgery

## 2018-04-06 ENCOUNTER — Other Ambulatory Visit: Payer: Self-pay

## 2018-04-06 ENCOUNTER — Ambulatory Visit (INDEPENDENT_AMBULATORY_CARE_PROVIDER_SITE_OTHER): Payer: 59 | Admitting: Orthopedic Surgery

## 2018-04-06 VITALS — Ht 64.0 in | Wt 203.0 lb

## 2018-04-06 DIAGNOSIS — M5410 Radiculopathy, site unspecified: Secondary | ICD-10-CM | POA: Diagnosis not present

## 2018-04-06 MED ORDER — IBUPROFEN 800 MG PO TABS: 800.0000 mg | ORAL_TABLET | Freq: Three times a day (TID) | ORAL | 3 refills | Status: DC | PRN

## 2018-04-06 NOTE — Addendum Note (Signed)
Addended by: Pamella Pert on: 04/06/2018 10:18 AM   Modules accepted: Orders
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
## 2018-04-09 ENCOUNTER — Other Ambulatory Visit: Payer: Self-pay | Admitting: Family Medicine

## 2018-04-09 DIAGNOSIS — E785 Hyperlipidemia, unspecified: Secondary | ICD-10-CM

## 2018-04-09 DIAGNOSIS — I10 Essential (primary) hypertension: Secondary | ICD-10-CM

## 2018-05-08 ENCOUNTER — Ambulatory Visit (INDEPENDENT_AMBULATORY_CARE_PROVIDER_SITE_OTHER): Payer: 59 | Admitting: Physician Assistant

## 2018-05-08 ENCOUNTER — Other Ambulatory Visit: Payer: Self-pay

## 2018-05-08 ENCOUNTER — Telehealth: Payer: Self-pay | Admitting: Physician Assistant

## 2018-05-08 ENCOUNTER — Ambulatory Visit (INDEPENDENT_AMBULATORY_CARE_PROVIDER_SITE_OTHER): Payer: Self-pay | Admitting: Physician Assistant

## 2018-05-08 ENCOUNTER — Encounter (INDEPENDENT_AMBULATORY_CARE_PROVIDER_SITE_OTHER): Payer: Self-pay | Admitting: Physician Assistant

## 2018-05-08 VITALS — Ht 64.0 in | Wt 203.0 lb

## 2018-05-08 DIAGNOSIS — M5410 Radiculopathy, site unspecified: Secondary | ICD-10-CM

## 2018-05-08 MED ORDER — PREDNISONE 10 MG PO TABS: 20.0000 mg | ORAL_TABLET | Freq: Every day | ORAL | 2 refills | Status: DC

## 2018-05-08 MED ORDER — TRAMADOL HCL 50 MG PO TABS: 50.0000 mg | ORAL_TABLET | Freq: Three times a day (TID) | ORAL | 0 refills | Status: DC | PRN

## 2018-05-08 NOTE — Telephone Encounter (Signed)
Resubmitted ultram orders to local pharmacy[facility] in [place].

## 2018-05-08 NOTE — Telephone Encounter (Signed)
I resubmitted the prednisone can you please resubmit the ultram? Thank you.

## 2018-05-08 NOTE — Progress Notes (Signed)
Office Visit Note   Patient: Amber Church           Date of Birth: 07/21/1955           MRN: [id number] Visit Date: 05/08/2018              Requested by: Denita Lung, MD [address] [place], [place] [address] PCP: Denita Lung, MD  Chief Complaint  Patient presents with  . Lower Back - Pain, Follow-up      HPI: The patient is a 63 yo woman who presents with worsening right lumbar radicular pain. She reports she has a constant ache over the right lower back that radiates down the right leg into the lateral buttock and lateral thigh. She has been taking ibuprofen 800 mg but it is not helping much more recently. She reports even driving and sitting for short periods is very uncomfortable and she is having difficulty sleeping due to the pain. She reports no bladder or bowel dysfunction. She reports the prednisone helped the most and wonders if she can resume taking this until she proceeds with LESI , hopefully later this month.   Assessment & Plan: Visit Diagnoses:  1. Radicular pain of right lower extremity     Plan: Resume Prednisone 20 mg at breakfast daily until she is seen later this month by Dr. Ernestina Patches for evaluation for LESI.  She will follow up in several weeks.   Follow-Up Instructions: Return in about 4 weeks (around 06/05/2018).   Ortho Exam  Patient is alert, oriented, no adenopathy, well-dressed, normal affect, normal respiratory effort. Ambulates with antalgic appearing gait over the right. + SLR on the right and mild weakness in the hamstrings but no other focal weakness .   Imaging: No results found. No images are attached to the encounter.  Labs: No results found for: HGBA1C, ESRSEDRATE, CRP, LABURIC, REPTSTATUS, GRAMSTAIN, CULT, LABORGA   Lab Results  Component Value Date   ALBUMIN 4.7 06/03/2017   ALBUMIN 4.0 01/15/2016   ALBUMIN 4.2 06/17/2014    Body mass index is 34.84 kg/m.  Orders:  No orders of the defined types were  placed in this encounter.  Meds ordered this encounter  Medications  . predniSONE (DELTASONE) 10 MG tablet    Sig: Take 2 tablets (20 mg total) by mouth daily with breakfast.    Dispense:  60 tablet    Refill:  2    20 mg daily for 1 week, then decrease to 10 mg Daily x 1 week, then decrease to 10 mg every other day x 1 week and then stop  . traMADol (ULTRAM) 50 MG tablet    Sig: Take 1 tablet (50 mg total) by mouth every 8 (eight) hours as needed for moderate pain or severe pain.    Dispense:  10 tablet    Refill:  0     Procedures: No procedures performed  Clinical Data: No additional findings.  ROS:  All other systems negative, except as noted in the HPI. Review of Systems  Objective: Vital Signs: Ht 5\' 4"  (1.626 m)   Wt 203 lb (92.1 kg)   BMI 34.84 kg/m   Specialty Comments:  No specialty comments available.  PMFS History: Patient Active Problem List   Diagnosis Date Noted  . Ingrowing nail, right great toe 12/23/2016  . Achilles tendonitis, bilateral 05/13/2016  . Achilles tendon contracture, bilateral 05/13/2016  . Hypertension 08/06/2010  . Hyperlipidemia LDL goal <100 08/06/2010  . Obesity (BMI  30-39.9) 08/06/2010   Past Medical History:  Diagnosis Date  . Abnormal EKG   . Abnormal pap    s/p cryotherapy  . Diverticulosis   . Hyperlipidemia   . Hypertension   . S/P colonoscopic polypectomy     Family History  Problem Relation Age of Onset  . Alzheimer's disease Mother   . Hyperlipidemia Mother   . Other Father        died of gunshot wound  . Osteoporosis Sister   . Osteoporosis Sister   . ALS Maternal Aunt   . ALS Maternal Uncle   . Diabetes Paternal Aunt   . Cancer Maternal Grandfather        lung cancer  . Cancer Paternal Grandmother        ?type  . Heart disease Neg Hx     Past Surgical History:  Procedure Laterality Date  . COLONOSCOPY  03/18/11; 2000   Dr. Amedeo Plenty; diverticulosis in sigmoid colon  . crysurgery  age 70   for  abnormal pap  . LAMINECTOMY  03/2002   Social History   Occupational History  . Occupation: quality control    Employer: [employer]  Tobacco Use  . Smoking status: Never Smoker  . Smokeless tobacco: Never Used  Substance and Sexual Activity  . Alcohol use: Yes    Comment: glass of wine 1-2 times per week.  . Drug use: No  . Sexual activity: Yes    Partners: Male    Comment: postmenopausal

## 2018-05-08 NOTE — Addendum Note (Signed)
Addended by: Milas Gain on: 05/08/2018 03:42 PM   Modules accepted: Orders

## 2018-05-08 NOTE — Telephone Encounter (Signed)
Pt called in said she had an appt today with Shawn and was giving some prescription for Prednisone 10 MG tablet and Tramadol 50 MG tablet and she asked for it to be sen to the [facility] in [place] but it was sent to [facility] and would like for that to be changed.

## 2018-05-11 ENCOUNTER — Encounter (INDEPENDENT_AMBULATORY_CARE_PROVIDER_SITE_OTHER): Payer: Self-pay | Admitting: Physical Medicine and Rehabilitation

## 2018-05-15 ENCOUNTER — Telehealth: Payer: Self-pay | Admitting: Physician Assistant

## 2018-05-15 NOTE — Telephone Encounter (Signed)
Please see below and fax to correct pharm please. Prednisone was already sent in.

## 2018-05-15 NOTE — Telephone Encounter (Signed)
Received voicemail message from Letts from [facility]. She asked if Rx for (Tramadol)  should be filled by them and not the local [facility]. The number to contact Gregary Signs is [phone]   Reference # [id number]

## 2018-05-25 ENCOUNTER — Encounter: Payer: Self-pay | Admitting: Physical Medicine and Rehabilitation

## 2018-05-25 ENCOUNTER — Telehealth: Payer: Self-pay | Admitting: *Deleted

## 2018-05-25 ENCOUNTER — Other Ambulatory Visit: Payer: Self-pay

## 2018-05-25 ENCOUNTER — Ambulatory Visit (INDEPENDENT_AMBULATORY_CARE_PROVIDER_SITE_OTHER): Payer: 59 | Admitting: Physical Medicine and Rehabilitation

## 2018-05-25 ENCOUNTER — Ambulatory Visit: Payer: Self-pay

## 2018-05-25 VITALS — BP 130/86 | HR 82

## 2018-05-25 DIAGNOSIS — M961 Postlaminectomy syndrome, not elsewhere classified: Secondary | ICD-10-CM

## 2018-05-25 DIAGNOSIS — M48062 Spinal stenosis, lumbar region with neurogenic claudication: Secondary | ICD-10-CM

## 2018-05-25 DIAGNOSIS — M5416 Radiculopathy, lumbar region: Secondary | ICD-10-CM | POA: Diagnosis not present

## 2018-05-25 MED ORDER — METHYLPREDNISOLONE ACETATE 80 MG/ML IJ SUSP
80.0000 mg | Freq: Once | INTRAMUSCULAR | Status: AC
  Administered 2018-05-25: 80 mg

## 2018-05-25 NOTE — Procedures (Signed)
Lumbar Epidural Steroid Injection - Interlaminar Approach with Fluoroscopic Guidance  Patient: Amber Church      Date of Birth: May 25, 1955 MRN: [id number] PCP: Denita Lung, MD      Visit Date: 05/25/2018   Universal Protocol:     Consent Given By: the patient  Position: PRONE  Additional Comments: Vital signs were monitored before and after the procedure. Patient was prepped and draped in the usual sterile fashion. The correct patient, procedure, and site was verified.   Injection Procedure Details:  Procedure Site One Meds Administered:  Meds ordered this encounter  Medications  . methylPREDNISolone acetate (DEPO-MEDROL) injection 80 mg     Laterality: Right  Location/Site:  L4-L5  Needle size: 20 G  Needle type: Tuohy  Needle Placement: Paramedian epidural  Findings:   -Comments: Excellent flow of contrast into the epidural space.  Initially once we were needling through the first layer of muscle which can be uncomfortable and feel like pressure or an aching muscle pain the patient really got apprehensive and really started to buck and move.  We did withdrawal the needle at that point and talk to her at length and then decided to restart the procedure and on that attempt did very well the patient did fine.  She would probably benefit from preprocedure Valium in the future.  Procedure Details: Using a paramedian approach from the side mentioned above, the region overlying the inferior lamina was localized under fluoroscopic visualization and the soft tissues overlying this structure were infiltrated with 4 ml. of 1% Lidocaine without Epinephrine. The Tuohy needle was inserted into the epidural space using a paramedian approach.   The epidural space was localized using loss of resistance along with lateral and bi-planar fluoroscopic views.  After negative aspirate for air, blood, and CSF, a 2 ml. volume of Isovue-250 was injected into the epidural space and the  flow of contrast was observed. Radiographs were obtained for documentation purposes.    The injectate was administered into the level noted above.   Additional Comments:  No complications occurred Dressing: 2 x 2 sterile gauze and Band-Aid    Post-procedure details: Patient was observed during the procedure. Post-procedure instructions were reviewed.  Patient left the clinic in stable condition.

## 2018-05-25 NOTE — Progress Notes (Signed)
Numeric Pain Rating Scale and Functional Assessment Average Pain 9   In the last MONTH (on 0-10 scale) has pain interfered with the following?  1. General activity like being  able to carry out your everyday physical activities such as walking, climbing stairs, carrying groceries, or moving a chair?  Rating(6)    +Driver, -BT, -Dye Allergies. 

## 2018-05-25 NOTE — Progress Notes (Signed)
Amber Church - 63 y.o. female MRN [id number]  Date of birth: 1955/04/23  Office Visit Note: Visit Date: 05/25/2018 PCP: Denita Lung, MD Referred by: Denita Lung, MD  Subjective: Chief Complaint  Patient presents with  . Lower Back - Pain  . Right Thigh - Pain   HPI:  Amber Church is a 63 y.o. female who comes in today For planned right L4-5 interlaminar epidural steroid injection diagnostically hopefully therapeutically.  She comes in today at the request of Dr. Meridee Score and his nurse practitioner Shawn Rayburn.  Patient has a remote history of left L5-S1 laminectomy by Dr. Glenna Fellows in 2004.  She has had continued months of right hip and leg pain some on the left but mostly right hip and leg pain.  It is worse if she sits for long time or walks.  Ibuprofen does seem to help some.  She has had prednisone which is helped to some degree.  Her pain is rated as a 9 out of 10.  She has no red flag complaints or weakness.  MRI does show multifactorial facet arthropathy at L4-5 with moderate stenosis but no high-grade nerve compression or disc herniation.  Depending on results would look at transforaminal approach for facet block.  *Interestingly the report of the lumbar spine MRI does not talk about any laminectomy or surgery but clearly on viewing the images on the left at L5-S1 the ligamentum flavum has been removed.  ROS Otherwise per HPI.  Assessment & Plan: Visit Diagnoses:  1. Lumbar radiculopathy   2. Spinal stenosis of lumbar region with neurogenic claudication   3. Post laminectomy syndrome     Plan: No additional findings.   Meds & Orders:  Meds ordered this encounter  Medications  . methylPREDNISolone acetate (DEPO-MEDROL) injection 80 mg    Orders Placed This Encounter  Procedures  . XR C-ARM NO REPORT  . Epidural Steroid injection    Follow-up: Return in about 4 weeks (around 06/22/2018) for ? repeat vs transforaminal.   Procedures: No procedures  performed  Lumbar Epidural Steroid Injection - Interlaminar Approach with Fluoroscopic Guidance  Patient: Amber Church      Date of Birth: 09-05-1955 MRN: [id number] PCP: Denita Lung, MD      Visit Date: 05/25/2018   Universal Protocol:     Consent Given By: the patient  Position: PRONE  Additional Comments: Vital signs were monitored before and after the procedure. Patient was prepped and draped in the usual sterile fashion. The correct patient, procedure, and site was verified.   Injection Procedure Details:  Procedure Site One Meds Administered:  Meds ordered this encounter  Medications  . methylPREDNISolone acetate (DEPO-MEDROL) injection 80 mg     Laterality: Right  Location/Site:  L4-L5  Needle size: 20 G  Needle type: Tuohy  Needle Placement: Paramedian epidural  Findings:   -Comments: Excellent flow of contrast into the epidural space.  Initially once we were needling through the first layer of muscle which can be uncomfortable and feel like pressure or an aching muscle pain the patient really got apprehensive and really started to buck and move.  We did withdrawal the needle at that point and talk to her at length and then decided to restart the procedure and on that attempt did very well the patient did fine.  She would probably benefit from preprocedure Valium in the future.  Procedure Details: Using a paramedian approach from the side mentioned above, the region  overlying the inferior lamina was localized under fluoroscopic visualization and the soft tissues overlying this structure were infiltrated with 4 ml. of 1% Lidocaine without Epinephrine. The Tuohy needle was inserted into the epidural space using a paramedian approach.   The epidural space was localized using loss of resistance along with lateral and bi-planar fluoroscopic views.  After negative aspirate for air, blood, and CSF, a 2 ml. volume of Isovue-250 was injected into the epidural  space and the flow of contrast was observed. Radiographs were obtained for documentation purposes.    The injectate was administered into the level noted above.   Additional Comments:  No complications occurred Dressing: 2 x 2 sterile gauze and Band-Aid    Post-procedure details: Patient was observed during the procedure. Post-procedure instructions were reviewed.  Patient left the clinic in stable condition.    Clinical History: MRI LUMBAR SPINE WITHOUT CONTRAST  TECHNIQUE: Multiplanar, multisequence MR imaging of the lumbar spine was performed. No intravenous contrast was administered.  COMPARISON:  None.  FINDINGS: Segmentation:  Standard.  Alignment:  Minimal grade 1 anterolisthesis of  Vertebrae:  No fracture, evidence of discitis, or bone lesion.  Conus medullaris and cauda equina: Conus extends to the T12 level. Conus and cauda equina appear normal.  Paraspinal and other soft tissues: No acute paraspinal abnormality.  Disc levels:  Disc spaces: Degenerative disc disease with disc height loss at L4-5 and L5-S1.  T12-L1: No significant disc bulge. No evidence of neural foraminal stenosis. No central canal stenosis. Mild bilateral facet arthropathy.  L1-L2: No significant disc bulge. No evidence of neural foraminal stenosis. No central canal stenosis.  L2-L3: Mild broad-based disc bulge. Mild bilateral facet arthropathy. No evidence of neural foraminal stenosis. No central canal stenosis.  L3-L4: Mild broad-based disc bulge. Moderate bilateral facet arthropathy. No evidence of neural foraminal stenosis. Mild spinal stenosis. Bilateral lateral recess stenosis.  L4-L5: Broad-based disc bulge. Moderate left and mild right facet arthropathy. Moderate spinal stenosis and bilateral lateral recess stenosis. No evidence of neural foraminal stenosis.  L5-S1: No significant disc bulge. No evidence of neural foraminal stenosis. No central canal  stenosis. Mild bilateral facet arthropathy.  IMPRESSION: 1. Lumbar spine spondylosis as described.   Electronically Signed   By: Kathreen Devoid   On: 03/30/2018 11:12     Objective:  VS:  HT:    WT:   BMI:     BP:130/86  HR:82bpm  TEMP: ( )  RESP:  Physical Exam Musculoskeletal:     Comments: Patient ambulates without aid with a slightly forward flexed lumbar spine and no foot drop.     Ortho Exam Imaging: Xr C-arm No Report  Result Date: 05/25/2018 Please see Notes tab for imaging impression.

## 2018-05-29 NOTE — Telephone Encounter (Signed)
I called and advised pt of Dr. Romona Curls message below

## 2018-05-29 NOTE — Telephone Encounter (Signed)
Patient called states that she was in for an injection last Monday for the right side of her back.  She states that now she is having pain in the center and on the left side of her back. She states that she has an appt on 06/22/2018 with Dr. Ernestina Patches but that she may need a sooner appt due to the amount of pain she is having.

## 2018-05-29 NOTE — Telephone Encounter (Signed)
Can't do anything until at least two weeks after injection, if right side is better then she just needs same L4-5 interlam on left. Injection was only 4 days ago I think, so she does need to give it more time. She can let us know how she is doing on tuesday

## 2018-06-01 ENCOUNTER — Other Ambulatory Visit (INDEPENDENT_AMBULATORY_CARE_PROVIDER_SITE_OTHER): Payer: Self-pay | Admitting: Physician Assistant

## 2018-06-01 DIAGNOSIS — M5410 Radiculopathy, site unspecified: Secondary | ICD-10-CM

## 2018-06-15 ENCOUNTER — Other Ambulatory Visit: Payer: Self-pay

## 2018-06-15 ENCOUNTER — Ambulatory Visit (INDEPENDENT_AMBULATORY_CARE_PROVIDER_SITE_OTHER): Payer: 59 | Admitting: Physician Assistant

## 2018-06-15 ENCOUNTER — Encounter: Payer: Self-pay | Admitting: Physician Assistant

## 2018-06-15 VITALS — Ht 64.0 in | Wt 203.0 lb

## 2018-06-15 DIAGNOSIS — M1711 Unilateral primary osteoarthritis, right knee: Secondary | ICD-10-CM

## 2018-06-15 DIAGNOSIS — M5410 Radiculopathy, site unspecified: Secondary | ICD-10-CM

## 2018-06-15 MED ORDER — TRAMADOL HCL 50 MG PO TABS: 50.0000 mg | ORAL_TABLET | Freq: Four times a day (QID) | ORAL | 0 refills | Status: DC | PRN

## 2018-06-15 NOTE — Progress Notes (Signed)
Office Visit Note   Patient: Amber Church           Date of Birth: 1955/10/04           MRN: [id number] Visit Date: 06/15/2018              Requested by: Denita Lung, MD [address] [place], [place] [address] PCP: Denita Lung, MD  Chief Complaint  Patient presents with  . Lower Back - Pain, Follow-up      HPI: The patient is a 63 yo woman who is seen for follow up of her #1 right knee osteoarthritis and #2 lumbar radiculopathy.  #1 she reports the hyaluronic acid injections helped greatly, but she is getting recurrent pain. She feels some of the pain is also due to her back, but she does localize pain over the medial joint line and has a small effusion.She reports her steroid injection to the knee in 02/2018 helped and she would like another steroid injection today. She would also like to repeat the hyaluronic acid injections when she can as she feels they helped significantly, but did not last long enough.   #2 She has undergone a LESI by Dr. Ernestina Patches and felt it did help some, but now she has developed symptoms over the left buttock as well and nothing she has tried has helped with the pain. She is taking ibuprofen 800 mg and Tylenol and neither is helping. She is trying to work but requests something to help her function until she can return for another LESI.   Assessment & Plan: Visit Diagnoses:  1. Unilateral primary osteoarthritis, right knee   2. Radicular pain of right lower extremity     Plan: After informed consent, the right knee was injected with a combination of lidocaine and Depo medrol under sterile techniques and the patient tolerated the procedure well. Tramadol 50 mg #10 refilled today.  Will re-submit for hyaluronic acid injection for right knee.  Follow up with Dr. Ernestina Patches as scheduled.   Follow-Up Instructions: Return in about 4 weeks (around 07/13/2018).   Ortho Exam  Patient is alert, oriented, no adenopathy, well-dressed, normal affect,  normal respiratory effort. Right knee with varus deformity and small effusion. Tender to palpation over the joint line.  She ambulates with antalgic appearing gait. No focal weakness or sensory changes, but + SLR on the right .   Imaging: No results found. No images are attached to the encounter.  Labs: No results found for: HGBA1C, ESRSEDRATE, CRP, LABURIC, REPTSTATUS, GRAMSTAIN, CULT, LABORGA   Lab Results  Component Value Date   ALBUMIN 4.7 06/03/2017   ALBUMIN 4.0 01/15/2016   ALBUMIN 4.2 06/17/2014    Body mass index is 34.84 kg/m.  Orders:  Orders Placed This Encounter  Procedures  . Large Joint Inj   Meds ordered this encounter  Medications  . traMADol (ULTRAM) 50 MG tablet    Sig: Take 1 tablet (50 mg total) by mouth every 6 (six) hours as needed for severe pain.    Dispense:  15 tablet    Refill:  0     Procedures: Large Joint Inj: R knee on 06/15/2018 4:42 PM Indications: pain and diagnostic evaluation Details: 22 G 1.5 in needle, anteromedial approach  Arthrogram: No  Medications: 5 mL lidocaine 1 %; 40 mg methylPREDNISolone acetate 40 MG/ML Outcome: tolerated well, no immediate complications Procedure, treatment alternatives, risks and benefits explained, specific risks discussed. Consent was given by the patient. Immediately prior to  procedure a time out was called to verify the correct patient, procedure, equipment, support staff and site/side marked as required. Patient was prepped and draped in the usual sterile fashion.      Clinical Data: No additional findings.  ROS:  All other systems negative, except as noted in the HPI. Review of Systems  Objective: Vital Signs: Ht 5\' 4"  (1.626 m)   Wt 203 lb (92.1 kg)   BMI 34.84 kg/m   Specialty Comments:  No specialty comments available.  PMFS History: Patient Active Problem List   Diagnosis Date Noted  . Ingrowing nail, right great toe 12/23/2016  . Achilles tendonitis, bilateral 05/13/2016   . Achilles tendon contracture, bilateral 05/13/2016  . Hypertension 08/06/2010  . Hyperlipidemia LDL goal <100 08/06/2010  . Obesity (BMI 30-39.9) 08/06/2010   Past Medical History:  Diagnosis Date  . Abnormal EKG   . Abnormal pap    s/p cryotherapy  . Diverticulosis   . Hyperlipidemia   . Hypertension   . S/P colonoscopic polypectomy     Family History  Problem Relation Age of Onset  . Alzheimer's disease Mother   . Hyperlipidemia Mother   . Other Father        died of gunshot wound  . Osteoporosis Sister   . Osteoporosis Sister   . ALS Maternal Aunt   . ALS Maternal Uncle   . Diabetes Paternal Aunt   . Cancer Maternal Grandfather        lung cancer  . Cancer Paternal Grandmother        ?type  . Heart disease Neg Hx     Past Surgical History:  Procedure Laterality Date  . COLONOSCOPY  03/18/11; 2000   Dr. Amedeo Plenty; diverticulosis in sigmoid colon  . crysurgery  age 38   for abnormal pap  . LAMINECTOMY  03/2002   Social History   Occupational History  . Occupation: quality control    Employer: [employer]  Tobacco Use  . Smoking status: Never Smoker  . Smokeless tobacco: Never Used  Substance and Sexual Activity  . Alcohol use: Yes    Comment: glass of wine 1-2 times per week.  . Drug use: No  . Sexual activity: Yes    Partners: Male    Comment: postmenopausal

## 2018-06-16 MED ORDER — METHYLPREDNISOLONE ACETATE 40 MG/ML IJ SUSP
40.0000 mg | INTRAMUSCULAR | Status: AC | PRN
  Administered 2018-06-15: 40 mg via INTRA_ARTICULAR

## 2018-06-16 MED ORDER — LIDOCAINE HCL 1 % IJ SOLN
5.0000 mL | INTRAMUSCULAR | Status: AC | PRN
  Administered 2018-06-15: 5 mL

## 2018-06-17 ENCOUNTER — Telehealth: Payer: Self-pay

## 2018-06-17 NOTE — Telephone Encounter (Signed)
Submitted VOB for Euflexxa series, right knee.

## 2018-06-19 ENCOUNTER — Telehealth: Payer: Self-pay

## 2018-06-19 NOTE — Telephone Encounter (Signed)
Called and left a VM advising patient to CB to schedule appointment with Dr. Sharol Given, Raquel Sarna, or [facility] for gel injection.  Approved for Euflexxa series, right knee. [place] Patient will be responsible for 20% OOP. No Co-pay No PA required

## 2018-06-22 ENCOUNTER — Ambulatory Visit (INDEPENDENT_AMBULATORY_CARE_PROVIDER_SITE_OTHER): Payer: 59 | Admitting: Physical Medicine and Rehabilitation

## 2018-06-22 ENCOUNTER — Encounter: Payer: Self-pay | Admitting: Physical Medicine and Rehabilitation

## 2018-06-22 ENCOUNTER — Ambulatory Visit: Payer: Self-pay

## 2018-06-22 ENCOUNTER — Other Ambulatory Visit: Payer: Self-pay

## 2018-06-22 VITALS — BP 121/80 | HR 87

## 2018-06-22 DIAGNOSIS — M5416 Radiculopathy, lumbar region: Secondary | ICD-10-CM

## 2018-06-22 MED ORDER — METHYLPREDNISOLONE ACETATE 80 MG/ML IJ SUSP
80.0000 mg | Freq: Once | INTRAMUSCULAR | Status: AC
  Administered 2018-06-22: 80 mg

## 2018-06-22 NOTE — Progress Notes (Signed)
Amber Church - 63 y.o. female MRN [id number]  Date of birth: [birth year]  Office Visit Note: Visit Date: 06/22/2018 PCP: Denita Lung, MD Referred by: Denita Lung, MD  Subjective: Chief Complaint  Patient presents with  . Lower Back - Pain  . Right Thigh - Pain   HPI: Amber Church is a 63 y.o. female who comes in today For repeat right L4-5 interlaminar epidural steroid injection.  She is followed by Dr. Meridee Score.  She again reports right buttock pain referral down the back of the right thigh and somewhat of an L5 distribution.  She has had prior remote lumbar laminectomy at L5-S1 and now has moderate multifactorial stenosis at L4-5.  She got good relief on 05/25/2018.  She reports her symptoms were quite relieved at that point but only lasted about 3 weeks and there is been slow return after that.  Tramadol seems to help to a degree.  Her average pain is 8 out of 10.  Depending on relief today would look at transforaminal approach.  Patient may be candidate for laminectomy at L4-5.  ROS Otherwise per HPI.  Assessment & Plan: Visit Diagnoses:  1. Lumbar radiculopathy     Plan: No additional findings.   Meds & Orders:  Meds ordered this encounter  Medications  . methylPREDNISolone acetate (DEPO-MEDROL) injection 80 mg    Orders Placed This Encounter  Procedures  . XR C-ARM NO REPORT  . Epidural Steroid injection    Follow-up: Return if symptoms worsen or fail to improve.   Procedures: No procedures performed  Lumbar Epidural Steroid Injection - Interlaminar Approach with Fluoroscopic Guidance  Patient: Amber Church      Date of Birth: 08-10-1955 MRN: [id number] PCP: Denita Lung, MD      Visit Date: 06/22/2018   Universal Protocol:     Consent Given By: the patient  Position: PRONE  Additional Comments: Vital signs were monitored before and after the procedure. Patient was prepped and draped in the usual sterile fashion. The correct  patient, procedure, and site was verified.   Injection Procedure Details:  Procedure Site One Meds Administered:  Meds ordered this encounter  Medications  . methylPREDNISolone acetate (DEPO-MEDROL) injection 80 mg     Laterality: Right  Location/Site:  L4-L5  Needle size: 20 G  Needle type: Tuohy  Needle Placement: Paramedian epidural  Findings:   -Comments: Excellent flow of contrast into the epidural space.  Procedure Details: Using a paramedian approach from the side mentioned above, the region overlying the inferior lamina was localized under fluoroscopic visualization and the soft tissues overlying this structure were infiltrated with 4 ml. of 1% Lidocaine without Epinephrine. The Tuohy needle was inserted into the epidural space using a paramedian approach.   The epidural space was localized using loss of resistance along with lateral and bi-planar fluoroscopic views.  After negative aspirate for air, blood, and CSF, a 2 ml. volume of Isovue-250 was injected into the epidural space and the flow of contrast was observed. Radiographs were obtained for documentation purposes.    The injectate was administered into the level noted above.   Additional Comments:  The patient tolerated the procedure well Dressing: 2 x 2 sterile gauze and Band-Aid    Post-procedure details: Patient was observed during the procedure. Post-procedure instructions were reviewed.  Patient left the clinic in stable condition.   Clinical History: MRI LUMBAR SPINE WITHOUT CONTRAST  TECHNIQUE: Multiplanar, multisequence MR imaging of the lumbar spine  was performed. No intravenous contrast was administered.  COMPARISON:  None.  FINDINGS: Segmentation:  Standard.  Alignment:  Minimal grade 1 anterolisthesis of  Vertebrae:  No fracture, evidence of discitis, or bone lesion.  Conus medullaris and cauda equina: Conus extends to the T12 level. Conus and cauda equina appear normal.   Paraspinal and other soft tissues: No acute paraspinal abnormality.  Disc levels:  Disc spaces: Degenerative disc disease with disc height loss at L4-5 and L5-S1.  T12-L1: No significant disc bulge. No evidence of neural foraminal stenosis. No central canal stenosis. Mild bilateral facet arthropathy.  L1-L2: No significant disc bulge. No evidence of neural foraminal stenosis. No central canal stenosis.  L2-L3: Mild broad-based disc bulge. Mild bilateral facet arthropathy. No evidence of neural foraminal stenosis. No central canal stenosis.  L3-L4: Mild broad-based disc bulge. Moderate bilateral facet arthropathy. No evidence of neural foraminal stenosis. Mild spinal stenosis. Bilateral lateral recess stenosis.  L4-L5: Broad-based disc bulge. Moderate left and mild right facet arthropathy. Moderate spinal stenosis and bilateral lateral recess stenosis. No evidence of neural foraminal stenosis.  L5-S1: No significant disc bulge. No evidence of neural foraminal stenosis. No central canal stenosis. Mild bilateral facet arthropathy.  IMPRESSION: 1. Lumbar spine spondylosis as described.   Electronically Signed   By: Kathreen Devoid   On: 03/30/2018 11:12   She reports that she has never smoked. She has never used smokeless tobacco. No results for input(s): HGBA1C, LABURIC in the last 8760 hours.  Objective:  VS:  HT:    WT:   BMI:     BP:121/80  HR:87bpm  TEMP: ( )  RESP:  Physical Exam  Ortho Exam Imaging: No results found.  Past Medical/Family/Surgical/Social History: Medications & Allergies reviewed per EMR, new medications updated. Patient Active Problem List   Diagnosis Date Noted  . Achilles tendonitis, bilateral 05/13/2016  . Achilles tendon contracture, bilateral 05/13/2016  . Hypertension 08/06/2010  . Hyperlipidemia LDL goal <100 08/06/2010  . Obesity (BMI 30-39.9) 08/06/2010   Past Medical History:  Diagnosis Date  . Abnormal EKG   .  Abnormal pap    s/p cryotherapy  . Diverticulosis   . Hyperlipidemia   . Hypertension   . S/P colonoscopic polypectomy    Family History  Problem Relation Age of Onset  . Alzheimer's disease Mother   . Hyperlipidemia Mother   . Other Father        died of gunshot wound  . Osteoporosis Sister   . Osteoporosis Sister   . ALS Maternal Aunt   . ALS Maternal Uncle   . Diabetes Paternal Aunt   . Cancer Maternal Grandfather        lung cancer  . Cancer Paternal Grandmother        ?type  . Heart disease Neg Hx    Past Surgical History:  Procedure Laterality Date  . COLONOSCOPY  03/18/11; 2000   Dr. Amedeo Plenty; diverticulosis in sigmoid colon  . crysurgery  age 13   for abnormal pap  . LAMINECTOMY  03/2002   Social History   Occupational History  . Occupation: quality control    Employer: [employer]  Tobacco Use  . Smoking status: Never Smoker  . Smokeless tobacco: Never Used  Substance and Sexual Activity  . Alcohol use: Yes    Comment: glass of wine 1-2 times per week.  . Drug use: No  . Sexual activity: Yes    Partners: Male    Comment: postmenopausal

## 2018-06-22 NOTE — Progress Notes (Signed)
 .  Numeric Pain Rating Scale and Functional Assessment Average Pain 8   In the last MONTH (on 0-10 scale) has pain interfered with the following?  1. General activity like being  able to carry out your everyday physical activities such as walking, climbing stairs, carrying groceries, or moving a chair?  Rating(7)   +Driver, -BT, -Dye Allergies.  

## 2018-06-28 ENCOUNTER — Other Ambulatory Visit: Payer: Self-pay | Admitting: Physician Assistant

## 2018-06-28 DIAGNOSIS — M5410 Radiculopathy, site unspecified: Secondary | ICD-10-CM

## 2018-06-30 ENCOUNTER — Other Ambulatory Visit: Payer: Self-pay | Admitting: Physician Assistant

## 2018-06-30 ENCOUNTER — Telehealth: Payer: Self-pay | Admitting: Physician Assistant

## 2018-06-30 DIAGNOSIS — M5410 Radiculopathy, site unspecified: Secondary | ICD-10-CM

## 2018-06-30 MED ORDER — TRAMADOL HCL 50 MG PO TABS: 50.0000 mg | ORAL_TABLET | Freq: Four times a day (QID) | ORAL | 0 refills | Status: DC | PRN

## 2018-06-30 NOTE — Telephone Encounter (Signed)
Patient called needing Rx refilled (Tramadol) The number to contact patient is [phone]

## 2018-06-30 NOTE — Telephone Encounter (Signed)
Patient was called and informed.

## 2018-06-30 NOTE — Telephone Encounter (Signed)
Shawn please advise, thank you 

## 2018-06-30 NOTE — Telephone Encounter (Signed)
Tramadol refilled for 10 tablets.

## 2018-07-03 ENCOUNTER — Other Ambulatory Visit: Payer: Self-pay | Admitting: Family Medicine

## 2018-07-03 DIAGNOSIS — E785 Hyperlipidemia, unspecified: Secondary | ICD-10-CM

## 2018-07-03 DIAGNOSIS — I10 Essential (primary) hypertension: Secondary | ICD-10-CM

## 2018-07-13 ENCOUNTER — Other Ambulatory Visit: Payer: Self-pay | Admitting: Orthopedic Surgery

## 2018-07-13 ENCOUNTER — Telehealth: Payer: Self-pay | Admitting: Physician Assistant

## 2018-07-13 DIAGNOSIS — M5410 Radiculopathy, site unspecified: Secondary | ICD-10-CM

## 2018-07-13 MED ORDER — TRAMADOL HCL 50 MG PO TABS: 50.0000 mg | ORAL_TABLET | Freq: Four times a day (QID) | ORAL | 0 refills | Status: DC | PRN

## 2018-07-13 NOTE — Telephone Encounter (Signed)
rx sent to [facility]

## 2018-07-13 NOTE — Telephone Encounter (Signed)
Patient called in wanting to know if she could get a refill on traMADol (ULTRAM) 50 MG tablet and asking for ibuprofen  also said she was prescribed these in November of last year and would like to take them again because the other medication is not working  Please advise

## 2018-07-13 NOTE — Telephone Encounter (Signed)
Please see message below. Pt received Tramadol #15 6/8 and #10 6/23 had cortisone injection 6/8 has been approved for eufflexxa injection right knee needs to make appt. Asking for refill on tramadol and ibuprofen.

## 2018-07-16 ENCOUNTER — Encounter: Payer: Self-pay | Admitting: Family Medicine

## 2018-07-16 ENCOUNTER — Ambulatory Visit (INDEPENDENT_AMBULATORY_CARE_PROVIDER_SITE_OTHER): Payer: 59 | Admitting: Family Medicine

## 2018-07-16 ENCOUNTER — Other Ambulatory Visit: Payer: Self-pay

## 2018-07-16 VITALS — BP 120/74 | HR 93 | Temp 97.7°F | Ht 64.5 in | Wt 218.8 lb

## 2018-07-16 DIAGNOSIS — I10 Essential (primary) hypertension: Secondary | ICD-10-CM

## 2018-07-16 DIAGNOSIS — E785 Hyperlipidemia, unspecified: Secondary | ICD-10-CM

## 2018-07-16 DIAGNOSIS — F4329 Adjustment disorder with other symptoms: Secondary | ICD-10-CM | POA: Diagnosis not present

## 2018-07-16 DIAGNOSIS — E669 Obesity, unspecified: Secondary | ICD-10-CM | POA: Diagnosis not present

## 2018-07-16 MED ORDER — PRAVASTATIN SODIUM 40 MG PO TABS: 40.0000 mg | ORAL_TABLET | Freq: Every day | ORAL | 3 refills | Status: DC

## 2018-07-16 MED ORDER — LISINOPRIL-HYDROCHLOROTHIAZIDE 10-12.5 MG PO TABS: 1.0000 | ORAL_TABLET | Freq: Every day | ORAL | 3 refills | Status: DC
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
## 2018-07-17 ENCOUNTER — Other Ambulatory Visit: Payer: Self-pay

## 2018-07-17 DIAGNOSIS — R944 Abnormal results of kidney function studies: Secondary | ICD-10-CM

## 2018-07-17 LAB — CBC WITH DIFFERENTIAL/PLATELET
Basophils Absolute: 0.1 10*3/uL (ref 0.0–0.2)
Basos: 1 %
EOS (ABSOLUTE): 0.2 10*3/uL (ref 0.0–0.4)
Eos: 2 %
Hematocrit: 38.2 % (ref 34.0–46.6)
Hemoglobin: 12.8 g/dL (ref 11.1–15.9)
Immature Grans (Abs): 0 10*3/uL (ref 0.0–0.1)
Immature Granulocytes: 0 %
Lymphocytes Absolute: 2.1 10*3/uL (ref 0.7–3.1)
Lymphs: 23 %
MCH: 29.9 pg (ref 26.6–33.0)
MCHC: 33.5 g/dL (ref 31.5–35.7)
MCV: 89 fL (ref 79–97)
Monocytes Absolute: 0.9 10*3/uL (ref 0.1–0.9)
Monocytes: 10 %
Neutrophils Absolute: 5.9 10*3/uL (ref 1.4–7.0)
Neutrophils: 64 %
Platelets: 325 10*3/uL (ref 150–450)
RBC: 4.28 x10E6/uL (ref 3.77–5.28)
RDW: 13 % (ref 11.7–15.4)
WBC: 9.1 10*3/uL (ref 3.4–10.8)

## 2018-07-17 LAB — COMPREHENSIVE METABOLIC PANEL
ALT: 20 IU/L (ref 0–32)
AST: 26 IU/L (ref 0–40)
Albumin/Globulin Ratio: 1.6 (ref 1.2–2.2)
Albumin: 4.1 g/dL (ref 3.8–4.8)
Alkaline Phosphatase: 63 IU/L (ref 39–117)
BUN/Creatinine Ratio: 22 (ref 12–28)
BUN: 34 mg/dL — ABNORMAL HIGH (ref 8–27)
Bilirubin Total: 0.5 mg/dL (ref 0.0–1.2)
CO2: 22 mmol/L (ref 20–29)
Calcium: 9.7 mg/dL (ref 8.7–10.3)
Chloride: 95 mmol/L — ABNORMAL LOW (ref 96–106)
Creatinine, Ser: 1.54 mg/dL — ABNORMAL HIGH (ref 0.57–1.00)
GFR calc Af Amer: 41 mL/min/{1.73_m2} — ABNORMAL LOW (ref >59)
GFR calc non Af Amer: 36 mL/min/{1.73_m2} — ABNORMAL LOW (ref >59)
Globulin, Total: 2.6 g/dL (ref 1.5–4.5)
Glucose: 102 mg/dL — ABNORMAL HIGH (ref 65–99)
Potassium: 4.1 mmol/L (ref 3.5–5.2)
Sodium: 135 mmol/L (ref 134–144)
Total Protein: 6.7 g/dL (ref 6.0–8.5)

## 2018-07-17 LAB — LIPID PANEL
Chol/HDL Ratio: 3.8 ratio (ref 0.0–4.4)
Cholesterol, Total: 190 mg/dL (ref 100–199)
HDL: 50 mg/dL (ref >39)
LDL Calculated: 113 mg/dL — ABNORMAL HIGH (ref 0–99)
Triglycerides: 136 mg/dL (ref 0–149)
VLDL Cholesterol Cal: 27 mg/dL (ref 5–40)

## 2018-07-17 NOTE — Progress Notes (Signed)
ERROR

## 2018-07-17 NOTE — Addendum Note (Signed)
Addended by: Denita Lung on: 07/17/2018 08:30 AM   Modules accepted: Orders

## 2018-07-27 ENCOUNTER — Telehealth: Payer: Self-pay | Admitting: *Deleted

## 2018-07-27 ENCOUNTER — Other Ambulatory Visit: Payer: Self-pay

## 2018-07-27 ENCOUNTER — Encounter: Payer: Self-pay | Admitting: Orthopedic Surgery

## 2018-07-27 ENCOUNTER — Ambulatory Visit (INDEPENDENT_AMBULATORY_CARE_PROVIDER_SITE_OTHER): Payer: 59 | Admitting: Family Medicine

## 2018-07-27 ENCOUNTER — Encounter: Payer: Self-pay | Admitting: Family Medicine

## 2018-07-27 ENCOUNTER — Ambulatory Visit (INDEPENDENT_AMBULATORY_CARE_PROVIDER_SITE_OTHER): Payer: 59 | Admitting: Orthopedic Surgery

## 2018-07-27 VITALS — BP 118/76 | HR 99 | Temp 98.1°F | Wt 214.2 lb

## 2018-07-27 VITALS — Ht 64.0 in | Wt 218.0 lb

## 2018-07-27 DIAGNOSIS — G5712 Meralgia paresthetica, left lower limb: Secondary | ICD-10-CM | POA: Diagnosis not present

## 2018-07-27 DIAGNOSIS — M1711 Unilateral primary osteoarthritis, right knee: Secondary | ICD-10-CM

## 2018-07-27 NOTE — Telephone Encounter (Signed)
Bilateral L5 tf esi, tell her for same reason which we can go over but done a different way.

## 2018-07-27 NOTE — Telephone Encounter (Signed)
Notification or Prior Authorization is not required for the requested services  This [employer] plan does not currently require a prior authorization for [phone]  Decision ID #:[id number]

## 2018-07-27 NOTE — Telephone Encounter (Signed)
Pt is scheduled for 08/17/2018 with driver.

## 2018-07-27 NOTE — Progress Notes (Signed)
   Subjective:    Patient ID: Amber Church, female    DOB: 07/11/1955, 63 y.o.   MRN: [id number]  HPI She is here for evaluation of a one-week history of left lateral symptoms that started out as a stinging sensation and then slight discomfort and numbness to the lateral aspect of the left hip.  No history of injury to that.  She has not been wearing tight clothes.  She does not complain of back or leg pain but has been getting epidural injections.   Review of Systems     Objective:   Physical Exam Alert and in no distress.  Decreased sensation is noted over the upper lateral hip area.  Normal motion of the hip.  Negative straight leg raising.       Assessment & Plan:   Encounter Diagnosis  Name Primary?  . Meralgia paresthetica of left side Yes  I explained that I did not think this was necessarily related to her back trouble but she will discuss this further with Dr. Ernestina Patches.  I explained that I thought it was the cutaneous nerve to that area of her hip.  Since she is not having difficulty now, no intervention needed

## 2018-07-29 ENCOUNTER — Encounter: Payer: Self-pay | Admitting: Orthopedic Surgery

## 2018-07-29 DIAGNOSIS — M1711 Unilateral primary osteoarthritis, right knee: Secondary | ICD-10-CM

## 2018-07-29 MED ORDER — SODIUM HYALURONATE (VISCOSUP) 20 MG/2ML IX SOSY
20.0000 mg | PREFILLED_SYRINGE | INTRA_ARTICULAR | Status: AC | PRN
  Administered 2018-07-29: 20 mg via INTRA_ARTICULAR

## 2018-07-29 MED ORDER — LIDOCAINE HCL 1 % IJ SOLN
1.0000 mL | INTRAMUSCULAR | Status: AC | PRN
  Administered 2018-07-29: 1 mL

## 2018-07-29 NOTE — Progress Notes (Signed)
Office Visit Note   Patient: Amber Church           Date of Birth: 02/02/1955           MRN: [id number] Visit Date: 07/27/2018              Requested by: Denita Lung, MD [address] [place],  [place] [address] PCP: Denita Lung, MD  Chief Complaint  Patient presents with  . Right Knee - Pain    Buy and bill Euflexxa injection right knee #1      HPI: Patient is a 63 year old woman who presents for evaluation for osteoarthritis right knee.  Patient is undergone steroid injections which have provided temporary relief.  Patient denies any mechanical catching or locking.  Assessment & Plan: Visit Diagnoses:  1. Unilateral primary osteoarthritis, right knee     Plan: Right knee was injected with Euflexxa plan to follow-up for 2 additional injections.  Follow-Up Instructions: No follow-ups on file.   Ortho Exam  Patient is alert, oriented, no adenopathy, well-dressed, normal affect, normal respiratory effort. Examination patient has no redness no cellulitis there is no effusion of the right knee there is crepitation with range of motion collaterals and cruciates are stable.  Imaging: No results found. No images are attached to the encounter.  Labs: No results found for: HGBA1C, ESRSEDRATE, CRP, LABURIC, REPTSTATUS, GRAMSTAIN, CULT, LABORGA   Lab Results  Component Value Date   ALBUMIN 4.1 07/16/2018   ALBUMIN 4.7 06/03/2017   ALBUMIN 4.0 01/15/2016    No results found for: MG No results found for: VD25OH  No results found for: PREALBUMIN CBC EXTENDED Latest Ref Rng & Units 07/16/2018 06/03/2017 01/15/2016  WBC 3.4 - 10.8 x10E3/uL 9.1 7.5 8.2  RBC 3.77 - 5.28 x10E6/uL 4.28 4.47 4.56  HGB 11.1 - 15.9 g/dL 12.8 13.6 13.1  HCT 34.0 - 46.6 % 38.2 38.9 40.6  PLT 150 - 450 x10E3/uL 325 336 351  NEUTROABS 1.4 - 7.0 x10E3/uL 5.9 4.0 4,592  LYMPHSABS 0.7 - 3.1 x10E3/uL 2.1 2.6 2,870     Body mass index is 37.42 kg/m.  Orders:  No orders of the  defined types were placed in this encounter.  No orders of the defined types were placed in this encounter.    Procedures: Large Joint Inj: R knee on 07/29/2018 4:47 PM Indications: pain and diagnostic evaluation Details: 22 G 1.5 in needle, anteromedial approach  Arthrogram: No  Medications: 1 mL lidocaine 1 %; 20 mg Sodium Hyaluronate 20 MG/2ML Outcome: tolerated well, no immediate complications Procedure, treatment alternatives, risks and benefits explained, specific risks discussed. Consent was given by the patient. Immediately prior to procedure a time out was called to verify the correct patient, procedure, equipment, support staff and site/side marked as required. Patient was prepped and draped in the usual sterile fashion.      Clinical Data: No additional findings.  ROS:  All other systems negative, except as noted in the HPI. Review of Systems  Objective: Vital Signs: Ht 5\' 4"  (1.626 m)   Wt 218 lb (98.9 kg)   BMI 37.42 kg/m   Specialty Comments:  No specialty comments available.  PMFS History: Patient Active Problem List   Diagnosis Date Noted  . Achilles tendonitis, bilateral 05/13/2016  . Achilles tendon contracture, bilateral 05/13/2016  . Hypertension 08/06/2010  . Hyperlipidemia LDL goal <100 08/06/2010  . Obesity (BMI 30-39.9) 08/06/2010   Past Medical History:  Diagnosis Date  . Abnormal EKG   .  Abnormal pap    s/p cryotherapy  . Diverticulosis   . Hyperlipidemia   . Hypertension   . S/P colonoscopic polypectomy     Family History  Problem Relation Age of Onset  . Alzheimer's disease Mother   . Hyperlipidemia Mother   . Other Father        died of gunshot wound  . Osteoporosis Sister   . Osteoporosis Sister   . ALS Maternal Aunt   . ALS Maternal Uncle   . Diabetes Paternal Aunt   . Cancer Maternal Grandfather        lung cancer  . Cancer Paternal Grandmother        ?type  . Heart disease Neg Hx     Past Surgical History:   Procedure Laterality Date  . COLONOSCOPY  03/18/11; 2000   Dr. Amedeo Plenty; diverticulosis in sigmoid colon  . crysurgery  age 39   for abnormal pap  . LAMINECTOMY  03/2002   Social History   Occupational History  . Occupation: quality control    Employer: [employer]  Tobacco Use  . Smoking status: Never Smoker  . Smokeless tobacco: Never Used  Substance and Sexual Activity  . Alcohol use: Yes    Comment: glass of wine 1-2 times per week.  . Drug use: No  . Sexual activity: Yes    Partners: Male    Comment: postmenopausal

## 2018-08-03 ENCOUNTER — Other Ambulatory Visit: Payer: Self-pay

## 2018-08-03 ENCOUNTER — Ambulatory Visit (INDEPENDENT_AMBULATORY_CARE_PROVIDER_SITE_OTHER): Payer: 59 | Admitting: Physician Assistant

## 2018-08-03 ENCOUNTER — Encounter: Payer: Self-pay | Admitting: Physician Assistant

## 2018-08-03 VITALS — Ht 64.0 in | Wt 214.0 lb

## 2018-08-03 DIAGNOSIS — M1711 Unilateral primary osteoarthritis, right knee: Secondary | ICD-10-CM | POA: Diagnosis not present

## 2018-08-03 MED ORDER — IBUPROFEN 800 MG PO TABS: 800.0000 mg | ORAL_TABLET | Freq: Three times a day (TID) | ORAL | 0 refills | Status: DC | PRN

## 2018-08-03 NOTE — Progress Notes (Signed)
Office Visit Note   Patient: Amber Church           Date of Birth: 08-08-1955           MRN: [id number] Visit Date: 08/03/2018              Requested by: Denita Lung, MD [address] [place],  [place] [address] PCP: Denita Lung, MD  Chief Complaint  Patient presents with  . Right Knee - Follow-up    #2 Euflexxa injection       HPI: The patient is a 63 year old woman who presents for her second Euflexxa injection to the right knee for primary osteoarthritis.  She is previously undergone steroid injections which have provided only temporary and minimal relief.  She denies any mechanical catching or locking. She would like some ibuprofen 800 mg to take on a prn basis as she does feel that this helps a little.  Assessment & Plan: Visit Diagnoses:  1. Unilateral primary osteoarthritis, right knee     Plan: After informed consent the patient's knee was anesthetized with 1% Xylocaine under sterile techniques and then injected with Euflexxa without difficulty.  The patient tolerated this well. She was given a prescription for ibuprofen 800 mg 3 times daily as needed pain.  She will follow-up next week for her third Euflexxa injection.  Follow-Up Instructions: Return in about 1 week (around 08/10/2018).   Ortho Exam  Patient is alert, oriented, no adenopathy, well-dressed, normal affect, normal respiratory effort. Ambulates with antalgic appearing gait and varus angulation of the right knee. Tender over the joint line. No instability. Mild effusion. Range of motion limited by pain on end range 0-110 degrees. No signs of infection . Mild ecchymosis over the previous injection site.   Imaging: No results found. No images are attached to the encounter.  Labs: No results found for: HGBA1C, ESRSEDRATE, CRP, LABURIC, REPTSTATUS, GRAMSTAIN, CULT, LABORGA   Lab Results  Component Value Date   ALBUMIN 4.1 07/16/2018   ALBUMIN 4.7 06/03/2017   ALBUMIN 4.0 01/15/2016     No results found for: MG No results found for: VD25OH  No results found for: PREALBUMIN CBC EXTENDED Latest Ref Rng & Units 07/16/2018 06/03/2017 01/15/2016  WBC 3.4 - 10.8 x10E3/uL 9.1 7.5 8.2  RBC 3.77 - 5.28 x10E6/uL 4.28 4.47 4.56  HGB 11.1 - 15.9 g/dL 12.8 13.6 13.1  HCT 34.0 - 46.6 % 38.2 38.9 40.6  PLT 150 - 450 x10E3/uL 325 336 351  NEUTROABS 1.4 - 7.0 x10E3/uL 5.9 4.0 4,592  LYMPHSABS 0.7 - 3.1 x10E3/uL 2.1 2.6 2,870     Body mass index is 36.73 kg/m.  Orders:  No orders of the defined types were placed in this encounter.  Meds ordered this encounter  Medications  . ibuprofen (ADVIL) 800 MG tablet    Sig: Take 1 tablet (800 mg total) by mouth every 8 (eight) hours as needed.    Dispense:  30 tablet    Refill:  0     Procedures: No procedures performed  Clinical Data: No additional findings.  ROS:  All other systems negative, except as noted in the HPI. Review of Systems  Objective: Vital Signs: Ht 5\' 4"  (1.626 m)   Wt 214 lb (97.1 kg)   BMI 36.73 kg/m   Specialty Comments:  No specialty comments available.  PMFS History: Patient Active Problem List   Diagnosis Date Noted  . Achilles tendonitis, bilateral 05/13/2016  . Achilles tendon contracture, bilateral 05/13/2016  .  Hypertension 08/06/2010  . Hyperlipidemia LDL goal <100 08/06/2010  . Obesity (BMI 30-39.9) 08/06/2010   Past Medical History:  Diagnosis Date  . Abnormal EKG   . Abnormal pap    s/p cryotherapy  . Diverticulosis   . Hyperlipidemia   . Hypertension   . S/P colonoscopic polypectomy     Family History  Problem Relation Age of Onset  . Alzheimer's disease Mother   . Hyperlipidemia Mother   . Other Father        died of gunshot wound  . Osteoporosis Sister   . Osteoporosis Sister   . ALS Maternal Aunt   . ALS Maternal Uncle   . Diabetes Paternal Aunt   . Cancer Maternal Grandfather        lung cancer  . Cancer Paternal Grandmother        ?type  . Heart disease  Neg Hx     Past Surgical History:  Procedure Laterality Date  . COLONOSCOPY  03/18/11; 2000   Dr. Amedeo Plenty; diverticulosis in sigmoid colon  . crysurgery  age 63   for abnormal pap  . LAMINECTOMY  03/2002   Social History   Occupational History  . Occupation: quality control    Employer: [employer]  Tobacco Use  . Smoking status: Never Smoker  . Smokeless tobacco: Never Used  Substance and Sexual Activity  . Alcohol use: Yes    Comment: glass of wine 1-2 times per week.  . Drug use: No  . Sexual activity: Yes    Partners: Male    Comment: postmenopausal

## 2018-08-07 NOTE — Procedures (Signed)
Lumbar Epidural Steroid Injection - Interlaminar Approach with Fluoroscopic Guidance  Patient: Amber Church      Date of Birth: 01-31-1955 MRN: [id number] PCP: Denita Lung, MD      Visit Date: 06/22/2018   Universal Protocol:     Consent Given By: the patient  Position: PRONE  Additional Comments: Vital signs were monitored before and after the procedure. Patient was prepped and draped in the usual sterile fashion. The correct patient, procedure, and site was verified.   Injection Procedure Details:  Procedure Site One Meds Administered:  Meds ordered this encounter  Medications  . methylPREDNISolone acetate (DEPO-MEDROL) injection 80 mg     Laterality: Right  Location/Site:  L4-L5  Needle size: 20 G  Needle type: Tuohy  Needle Placement: Paramedian epidural  Findings:   -Comments: Excellent flow of contrast into the epidural space.  Procedure Details: Using a paramedian approach from the side mentioned above, the region overlying the inferior lamina was localized under fluoroscopic visualization and the soft tissues overlying this structure were infiltrated with 4 ml. of 1% Lidocaine without Epinephrine. The Tuohy needle was inserted into the epidural space using a paramedian approach.   The epidural space was localized using loss of resistance along with lateral and bi-planar fluoroscopic views.  After negative aspirate for air, blood, and CSF, a 2 ml. volume of Isovue-250 was injected into the epidural space and the flow of contrast was observed. Radiographs were obtained for documentation purposes.    The injectate was administered into the level noted above.   Additional Comments:  The patient tolerated the procedure well Dressing: 2 x 2 sterile gauze and Band-Aid    Post-procedure details: Patient was observed during the procedure. Post-procedure instructions were reviewed.  Patient left the clinic in stable condition.

## 2018-08-10 ENCOUNTER — Encounter: Payer: Self-pay | Admitting: Orthopedic Surgery

## 2018-08-10 ENCOUNTER — Ambulatory Visit (INDEPENDENT_AMBULATORY_CARE_PROVIDER_SITE_OTHER): Payer: 59 | Admitting: Orthopedic Surgery

## 2018-08-10 VITALS — Ht 64.0 in | Wt 214.0 lb

## 2018-08-10 DIAGNOSIS — M1711 Unilateral primary osteoarthritis, right knee: Secondary | ICD-10-CM

## 2018-08-11 ENCOUNTER — Encounter: Payer: Self-pay | Admitting: Orthopedic Surgery

## 2018-08-11 DIAGNOSIS — M1711 Unilateral primary osteoarthritis, right knee: Secondary | ICD-10-CM

## 2018-08-11 MED ORDER — SODIUM HYALURONATE (VISCOSUP) 20 MG/2ML IX SOSY
20.0000 mg | PREFILLED_SYRINGE | INTRA_ARTICULAR | Status: AC | PRN
  Administered 2018-08-11: 20 mg via INTRA_ARTICULAR

## 2018-08-11 MED ORDER — LIDOCAINE HCL 1 % IJ SOLN
5.0000 mL | INTRAMUSCULAR | Status: AC | PRN
  Administered 2018-08-11: 5 mL

## 2018-08-11 NOTE — Progress Notes (Signed)
Office Visit Note   Patient: Amber Church           Date of Birth: March 28, 1955           MRN: [id number] Visit Date: 08/10/2018              Requested by: Denita Lung, MD [address] [place],  [place] [address] PCP: Denita Lung, MD  Chief Complaint  Patient presents with  . Right Knee - Follow-up    #3 Euflexxa       HPI: Patient is a 63 year old woman who presents for her third Euflexxa injection.  She states she still has knee pain.  Assessment & Plan: Visit Diagnoses:  1. Unilateral primary osteoarthritis, right knee     Plan: Right knee was injected with Euflexxa without complications.  She will follow-up as needed.  Recommended compression and exercise.  Follow-Up Instructions: Return if symptoms worsen or fail to improve.   Ortho Exam  Patient is alert, oriented, no adenopathy, well-dressed, normal affect, normal respiratory effort. Examination patient has no effusion no redness no cellulitis no signs of infection.  Imaging: No results found. No images are attached to the encounter.  Labs: No results found for: HGBA1C, ESRSEDRATE, CRP, LABURIC, REPTSTATUS, GRAMSTAIN, CULT, LABORGA   Lab Results  Component Value Date   ALBUMIN 4.1 07/16/2018   ALBUMIN 4.7 06/03/2017   ALBUMIN 4.0 01/15/2016    No results found for: MG No results found for: VD25OH  No results found for: PREALBUMIN CBC EXTENDED Latest Ref Rng & Units 07/16/2018 06/03/2017 01/15/2016  WBC 3.4 - 10.8 x10E3/uL 9.1 7.5 8.2  RBC 3.77 - 5.28 x10E6/uL 4.28 4.47 4.56  HGB 11.1 - 15.9 g/dL 12.8 13.6 13.1  HCT 34.0 - 46.6 % 38.2 38.9 40.6  PLT 150 - 450 x10E3/uL 325 336 351  NEUTROABS 1.4 - 7.0 x10E3/uL 5.9 4.0 4,592  LYMPHSABS 0.7 - 3.1 x10E3/uL 2.1 2.6 2,870     Body mass index is 36.73 kg/m.  Orders:  No orders of the defined types were placed in this encounter.  No orders of the defined types were placed in this encounter.    Procedures: Large Joint Inj: R knee  on 08/11/2018 9:10 AM Indications: pain and diagnostic evaluation Details: 22 G 1.5 in needle, anteromedial approach  Arthrogram: No  Medications: 5 mL lidocaine 1 %; 20 mg Sodium Hyaluronate 20 MG/2ML Outcome: tolerated well, no immediate complications Procedure, treatment alternatives, risks and benefits explained, specific risks discussed. Consent was given by the patient. Immediately prior to procedure a time out was called to verify the correct patient, procedure, equipment, support staff and site/side marked as required. Patient was prepped and draped in the usual sterile fashion.      Clinical Data: No additional findings.  ROS:  All other systems negative, except as noted in the HPI. Review of Systems  Objective: Vital Signs: Ht 5\' 4"  (1.626 m)   Wt 214 lb (97.1 kg)   BMI 36.73 kg/m   Specialty Comments:  No specialty comments available.  PMFS History: Patient Active Problem List   Diagnosis Date Noted  . Achilles tendonitis, bilateral 05/13/2016  . Achilles tendon contracture, bilateral 05/13/2016  . Hypertension 08/06/2010  . Hyperlipidemia LDL goal <100 08/06/2010  . Obesity (BMI 30-39.9) 08/06/2010   Past Medical History:  Diagnosis Date  . Abnormal EKG   . Abnormal pap    s/p cryotherapy  . Diverticulosis   . Hyperlipidemia   . Hypertension   .  S/P colonoscopic polypectomy     Family History  Problem Relation Age of Onset  . Alzheimer's disease Mother   . Hyperlipidemia Mother   . Other Father        died of gunshot wound  . Osteoporosis Sister   . Osteoporosis Sister   . ALS Maternal Aunt   . ALS Maternal Uncle   . Diabetes Paternal Aunt   . Cancer Maternal Grandfather        lung cancer  . Cancer Paternal Grandmother        ?type  . Heart disease Neg Hx     Past Surgical History:  Procedure Laterality Date  . COLONOSCOPY  03/18/11; 2000   Dr. Amedeo Plenty; diverticulosis in sigmoid colon  . crysurgery  age 30   for abnormal pap  .  LAMINECTOMY  03/2002   Social History   Occupational History  . Occupation: quality control    Employer: [employer]  Tobacco Use  . Smoking status: Never Smoker  . Smokeless tobacco: Never Used  Substance and Sexual Activity  . Alcohol use: Yes    Comment: glass of wine 1-2 times per week.  . Drug use: No  . Sexual activity: Yes    Partners: Male    Comment: postmenopausal

## 2018-08-17 ENCOUNTER — Other Ambulatory Visit: Payer: Self-pay

## 2018-08-17 ENCOUNTER — Ambulatory Visit: Payer: Self-pay

## 2018-08-17 ENCOUNTER — Encounter: Payer: Self-pay | Admitting: Physical Medicine and Rehabilitation

## 2018-08-17 ENCOUNTER — Ambulatory Visit (INDEPENDENT_AMBULATORY_CARE_PROVIDER_SITE_OTHER): Payer: 59 | Admitting: Physical Medicine and Rehabilitation

## 2018-08-17 VITALS — BP 111/74 | HR 90

## 2018-08-17 DIAGNOSIS — M5416 Radiculopathy, lumbar region: Secondary | ICD-10-CM

## 2018-08-17 MED ORDER — BETAMETHASONE SOD PHOS & ACET 6 (3-3) MG/ML IJ SUSP
12.0000 mg | Freq: Once | INTRAMUSCULAR | Status: AC
  Administered 2018-08-17: 12 mg

## 2018-08-17 NOTE — Progress Notes (Signed)
.     Numeric Pain Rating Scale and Functional Assessment Average Pain 10   In the last MONTH (on 0-10 scale) has pain interfered with the following?  1. General activity like being  able to carry out your everyday physical activities such as walking, climbing stairs, carrying groceries, or moving a chair?  Rating(7)   +Driver, -BT, -Dye Allergies.  

## 2018-08-18 ENCOUNTER — Other Ambulatory Visit: Payer: 59

## 2018-08-18 ENCOUNTER — Other Ambulatory Visit: Payer: Self-pay

## 2018-08-18 DIAGNOSIS — I10 Essential (primary) hypertension: Secondary | ICD-10-CM

## 2018-08-18 NOTE — Progress Notes (Signed)
Amber Church - 63 y.o. female MRN [id number]  Date of birth: February 11, 1955  Office Visit Note: Visit Date: 08/17/2018 PCP: Denita Lung, MD Referred by: Denita Lung, MD  Subjective: Chief Complaint  Patient presents with  . Lower Back - Pain   HPI:  Amber Church is a 63 y.o. female who comes in today For planned repeat epidural injection for low back pain and right buttock and leg pain consistent more with a radicular nature of pain.  Prior injection in June gave her 3 to 4 weeks of decent relief.  This was an interlaminar injection at L4-5.  She has multifactorial moderate stenosis at that level without focal nerve compression.  She has moderate arthritis at that level and really at L3-4.  No significant disc herniation.  Case is complicated for some level of anxiety.  She is also followed by Dr. Redmond School who is helped her some with her problems.  I think at this point given the fact that it does not seem to last very long we should try a transforaminal approach at L4 on the right and I discussed this with her.  Depending on relief with either look at facet joint block versus repeat interlaminar injection.  She might do well with decompression at that level.  ROS Otherwise per HPI.  Assessment & Plan: Visit Diagnoses:  1. Lumbar radiculopathy     Plan: No additional findings.   Meds & Orders:  Meds ordered this encounter  Medications  . betamethasone acetate-betamethasone sodium phosphate (CELESTONE) injection 12 mg    Orders Placed This Encounter  Procedures  . XR C-ARM NO REPORT  . Epidural Steroid injection    Follow-up: Return if symptoms worsen or fail to improve.   Procedures: No procedures performed  Lumbosacral Transforaminal Epidural Steroid Injection - Sub-Pedicular Approach with Fluoroscopic Guidance  Patient: Amber Church      Date of Birth: 05/25/1955 MRN: [id number] PCP: Denita Lung, MD      Visit Date: 08/17/2018   Universal  Protocol:    Date/Time: 08/17/2018  Consent Given By: the patient  Position: PRONE  Additional Comments: Vital signs were monitored before and after the procedure. Patient was prepped and draped in the usual sterile fashion. The correct patient, procedure, and site was verified.   Injection Procedure Details:  Procedure Site One Meds Administered:  Meds ordered this encounter  Medications  . betamethasone acetate-betamethasone sodium phosphate (CELESTONE) injection 12 mg    Laterality: Right  Location/Site:  L4-L5  Needle size: 22 G  Needle type: Spinal  Needle Placement: Transforaminal  Findings:    -Comments: Excellent flow of contrast along the nerve and into the epidural space.  Procedure Details: After squaring off the end-plates to get a true AP view, the C-arm was positioned so that an oblique view of the foramen as noted above was visualized. The target area is just inferior to the "nose of the scotty dog" or sub pedicular. The soft tissues overlying this structure were infiltrated with 2-3 ml. of 1% Lidocaine without Epinephrine.  The spinal needle was inserted toward the target using a "trajectory" view along the fluoroscope beam.  Under AP and lateral visualization, the needle was advanced so it did not puncture dura and was located close the 6 O'Clock position of the pedical in AP tracterory. Biplanar projections were used to confirm position. Aspiration was confirmed to be negative for CSF and/or blood. A 1-2 ml. volume of Isovue-250 was injected and  flow of contrast was noted at each level. Radiographs were obtained for documentation purposes.   After attaining the desired flow of contrast documented above, a 0.5 to 1.0 ml test dose of 0.25% Marcaine was injected into each respective transforaminal space.  The patient was observed for 90 seconds post injection.  After no sensory deficits were reported, and normal lower extremity motor function was noted,   the  above injectate was administered so that equal amounts of the injectate were placed at each foramen (level) into the transforaminal epidural space.   Additional Comments:  The patient tolerated the procedure well Dressing: 2 x 2 sterile gauze and Band-Aid    Post-procedure details: Patient was observed during the procedure. Post-procedure instructions were reviewed.  Patient left the clinic in stable condition.     Clinical History: MRI LUMBAR SPINE WITHOUT CONTRAST  TECHNIQUE: Multiplanar, multisequence MR imaging of the lumbar spine was performed. No intravenous contrast was administered.  COMPARISON:  None.  FINDINGS: Segmentation:  Standard.  Alignment:  Minimal grade 1 anterolisthesis of  Vertebrae:  No fracture, evidence of discitis, or bone lesion.  Conus medullaris and cauda equina: Conus extends to the T12 level. Conus and cauda equina appear normal.  Paraspinal and other soft tissues: No acute paraspinal abnormality.  Disc levels:  Disc spaces: Degenerative disc disease with disc height loss at L4-5 and L5-S1.  T12-L1: No significant disc bulge. No evidence of neural foraminal stenosis. No central canal stenosis. Mild bilateral facet arthropathy.  L1-L2: No significant disc bulge. No evidence of neural foraminal stenosis. No central canal stenosis.  L2-L3: Mild broad-based disc bulge. Mild bilateral facet arthropathy. No evidence of neural foraminal stenosis. No central canal stenosis.  L3-L4: Mild broad-based disc bulge. Moderate bilateral facet arthropathy. No evidence of neural foraminal stenosis. Mild spinal stenosis. Bilateral lateral recess stenosis.  L4-L5: Broad-based disc bulge. Moderate left and mild right facet arthropathy. Moderate spinal stenosis and bilateral lateral recess stenosis. No evidence of neural foraminal stenosis.  L5-S1: No significant disc bulge. No evidence of neural foraminal stenosis. No central canal  stenosis. Mild bilateral facet arthropathy.  IMPRESSION: 1. Lumbar spine spondylosis as described.   Electronically Signed   By: Kathreen Devoid   On: 03/30/2018 11:12     Objective:  VS:  HT:    WT:   BMI:     BP:111/74  HR:90bpm  TEMP: ( )  RESP:  Physical Exam  Ortho Exam Imaging: Xr C-arm No Report  Result Date: 08/17/2018 Please see Notes tab for imaging impression.

## 2018-08-18 NOTE — Procedures (Signed)
Lumbosacral Transforaminal Epidural Steroid Injection - Sub-Pedicular Approach with Fluoroscopic Guidance  Patient: Amber Church      Date of Birth: October 06, 1955 MRN: [id number] PCP: Denita Lung, MD      Visit Date: 08/17/2018   Universal Protocol:    Date/Time: 08/17/2018  Consent Given By: the patient  Position: PRONE  Additional Comments: Vital signs were monitored before and after the procedure. Patient was prepped and draped in the usual sterile fashion. The correct patient, procedure, and site was verified.   Injection Procedure Details:  Procedure Site One Meds Administered:  Meds ordered this encounter  Medications  . betamethasone acetate-betamethasone sodium phosphate (CELESTONE) injection 12 mg    Laterality: Right  Location/Site:  L4-L5  Needle size: 22 G  Needle type: Spinal  Needle Placement: Transforaminal  Findings:    -Comments: Excellent flow of contrast along the nerve and into the epidural space.  Procedure Details: After squaring off the end-plates to get a true AP view, the C-arm was positioned so that an oblique view of the foramen as noted above was visualized. The target area is just inferior to the "nose of the scotty dog" or sub pedicular. The soft tissues overlying this structure were infiltrated with 2-3 ml. of 1% Lidocaine without Epinephrine.  The spinal needle was inserted toward the target using a "trajectory" view along the fluoroscope beam.  Under AP and lateral visualization, the needle was advanced so it did not puncture dura and was located close the 6 O'Clock position of the pedical in AP tracterory. Biplanar projections were used to confirm position. Aspiration was confirmed to be negative for CSF and/or blood. A 1-2 ml. volume of Isovue-250 was injected and flow of contrast was noted at each level. Radiographs were obtained for documentation purposes.   After attaining the desired flow of contrast documented above, a 0.5  to 1.0 ml test dose of 0.25% Marcaine was injected into each respective transforaminal space.  The patient was observed for 90 seconds post injection.  After no sensory deficits were reported, and normal lower extremity motor function was noted,   the above injectate was administered so that equal amounts of the injectate were placed at each foramen (level) into the transforaminal epidural space.   Additional Comments:  The patient tolerated the procedure well Dressing: 2 x 2 sterile gauze and Band-Aid    Post-procedure details: Patient was observed during the procedure. Post-procedure instructions were reviewed.  Patient left the clinic in stable condition.

## 2018-08-19 LAB — RENAL FUNCTION PANEL
Albumin: 4.3 g/dL (ref 3.8–4.8)
BUN/Creatinine Ratio: 19 (ref 12–28)
BUN: 18 mg/dL (ref 8–27)
CO2: 21 mmol/L (ref 20–29)
Calcium: 9.5 mg/dL (ref 8.7–10.3)
Chloride: 98 mmol/L (ref 96–106)
Creatinine, Ser: 0.95 mg/dL (ref 0.57–1.00)
GFR calc Af Amer: 74 mL/min/{1.73_m2} (ref >59)
GFR calc non Af Amer: 64 mL/min/{1.73_m2} (ref >59)
Glucose: 122 mg/dL — ABNORMAL HIGH (ref 65–99)
Phosphorus: 2.8 mg/dL — ABNORMAL LOW (ref 3.0–4.3)
Potassium: 4.8 mmol/L (ref 3.5–5.2)
Sodium: 135 mmol/L (ref 134–144)

## 2018-09-11 ENCOUNTER — Ambulatory Visit: Payer: Self-pay

## 2018-09-11 ENCOUNTER — Ambulatory Visit (INDEPENDENT_AMBULATORY_CARE_PROVIDER_SITE_OTHER): Payer: 59 | Admitting: Family

## 2018-09-11 ENCOUNTER — Encounter: Payer: Self-pay | Admitting: Family

## 2018-09-11 VITALS — Ht 64.0 in | Wt 214.0 lb

## 2018-09-11 DIAGNOSIS — M1711 Unilateral primary osteoarthritis, right knee: Secondary | ICD-10-CM

## 2018-09-11 DIAGNOSIS — M5410 Radiculopathy, site unspecified: Secondary | ICD-10-CM | POA: Diagnosis not present

## 2018-09-11 MED ORDER — NABUMETONE 500 MG PO TABS: 500.0000 mg | ORAL_TABLET | Freq: Every day | ORAL | 0 refills | Status: DC

## 2018-09-11 MED ORDER — TRAMADOL HCL 50 MG PO TABS: 50.0000 mg | ORAL_TABLET | Freq: Four times a day (QID) | ORAL | 0 refills | Status: DC | PRN

## 2018-09-11 NOTE — Progress Notes (Signed)
Office Visit Note   Patient: Amber Church           Date of Birth: 03/24/1955           MRN: [id number] Visit Date: 09/11/2018              Requested by: Denita Lung, MD [address] [place],  [place] [address] PCP: Denita Lung, MD  Chief Complaint  Patient presents with  . Right Knee - Follow-up, Pain    #3 Euflexxa inj      HPI: The patient is a 63 year old woman who presents following her series of 3 Euflexxa injections in her right knee for primary osteoarthritis.  She reports this did not provide any relief.  She has had steroid injections in the past with very minimal relief.  She has been having pain in her right knee worse with ambulation and weightbearing.  Denies mechanical symptoms.  She gets very minimal relief with ibuprofen.  Has been prescribed tramadol in the past for her pain has difficulty sleeping due to knee pain and unable to get comfortable in bed.    Assessment & Plan: Visit Diagnoses:  1. Primary osteoarthritis of right knee     Plan: Radiographs repeated today for Dr. Sharol Given.  We will post her for total knee arthroplasty on the right.  The patient was provided a prescription for tramadol as well as Relafen.  States her current insurance under [employer] runs out at the end of this month.  Concerned about payment for surgery if she cannot get this done before the end of September.  Follow-Up Instructions: Return for post op.   Ortho Exam  Patient is alert, oriented, no adenopathy, well-dressed, normal affect, normal respiratory effort. Ambulates with antalgic appearing gait and varus angulation of the right knee. Tender over the joint line. No instability. no effusion. Range of motion limited by pain on end range 0-110 degrees. No signs of infection.  Imaging: No results found. No images are attached to the encounter.  Labs: No results found for: HGBA1C, ESRSEDRATE, CRP, LABURIC, REPTSTATUS, GRAMSTAIN, CULT, LABORGA   Lab  Results  Component Value Date   ALBUMIN 4.3 08/18/2018   ALBUMIN 4.1 07/16/2018   ALBUMIN 4.7 06/03/2017    No results found for: MG No results found for: VD25OH  No results found for: PREALBUMIN CBC EXTENDED Latest Ref Rng & Units 07/16/2018 06/03/2017 01/15/2016  WBC 3.4 - 10.8 x10E3/uL 9.1 7.5 8.2  RBC 3.77 - 5.28 x10E6/uL 4.28 4.47 4.56  HGB 11.1 - 15.9 g/dL 12.8 13.6 13.1  HCT 34.0 - 46.6 % 38.2 38.9 40.6  PLT 150 - 450 x10E3/uL 325 336 351  NEUTROABS 1.4 - 7.0 x10E3/uL 5.9 4.0 4,592  LYMPHSABS 0.7 - 3.1 x10E3/uL 2.1 2.6 2,870     Body mass index is 36.73 kg/m.  Orders:  Orders Placed This Encounter  Procedures  . XR Knee 1-2 Views Right   No orders of the defined types were placed in this encounter.    Procedures: No procedures performed  Clinical Data: No additional findings.  ROS:  All other systems negative, except as noted in the HPI. Review of Systems  Constitutional: Negative for chills and fever.  Musculoskeletal: Positive for arthralgias and gait problem. Negative for joint swelling.    Objective: Vital Signs: Ht 5\' 4"  (1.626 m)   Wt 214 lb (97.1 kg)   BMI 36.73 kg/m   Specialty Comments:  No specialty comments available.  PMFS History: Patient  Active Problem List   Diagnosis Date Noted  . Achilles tendonitis, bilateral 05/13/2016  . Achilles tendon contracture, bilateral 05/13/2016  . Hypertension 08/06/2010  . Hyperlipidemia LDL goal <100 08/06/2010  . Obesity (BMI 30-39.9) 08/06/2010   Past Medical History:  Diagnosis Date  . Abnormal EKG   . Abnormal pap    s/p cryotherapy  . Diverticulosis   . Hyperlipidemia   . Hypertension   . S/P colonoscopic polypectomy     Family History  Problem Relation Age of Onset  . Alzheimer's disease Mother   . Hyperlipidemia Mother   . Other Father        died of gunshot wound  . Osteoporosis Sister   . Osteoporosis Sister   . ALS Maternal Aunt   . ALS Maternal Uncle   . Diabetes Paternal  Aunt   . Cancer Maternal Grandfather        lung cancer  . Cancer Paternal Grandmother        ?type  . Heart disease Neg Hx     Past Surgical History:  Procedure Laterality Date  . COLONOSCOPY  03/18/11; 2000   Dr. Amedeo Plenty; diverticulosis in sigmoid colon  . crysurgery  age 56   for abnormal pap  . LAMINECTOMY  03/2002   Social History   Occupational History  . Occupation: quality control    Employer: [employer]  Tobacco Use  . Smoking status: Never Smoker  . Smokeless tobacco: Never Used  Substance and Sexual Activity  . Alcohol use: Yes    Comment: glass of wine 1-2 times per week.  . Drug use: No  . Sexual activity: Yes    Partners: Male    Comment: postmenopausal

## 2018-09-25 ENCOUNTER — Other Ambulatory Visit: Payer: Self-pay

## 2018-09-25 ENCOUNTER — Encounter (HOSPITAL_COMMUNITY)
Admission: RE | Admit: 2018-09-25 | Discharge: 2018-09-25 | Disposition: A | Discharge status: Home / Self Care | Payer: 59 | Source: Ambulatory Visit | Attending: Orthopedic Surgery | Admitting: Orthopedic Surgery

## 2018-09-25 ENCOUNTER — Encounter (HOSPITAL_COMMUNITY): Payer: Self-pay

## 2018-09-25 DIAGNOSIS — I10 Essential (primary) hypertension: Secondary | ICD-10-CM | POA: Insufficient documentation

## 2018-09-25 DIAGNOSIS — Z01818 Encounter for other preprocedural examination: Secondary | ICD-10-CM | POA: Insufficient documentation

## 2018-09-25 HISTORY — DX: Depression, unspecified: F32.A

## 2018-09-25 HISTORY — DX: Gastro-esophageal reflux disease without esophagitis: K21.9

## 2018-09-25 HISTORY — DX: Anxiety disorder, unspecified: F41.9

## 2018-09-25 HISTORY — DX: Unspecified osteoarthritis, unspecified site: M19.90

## 2018-09-25 LAB — CBC
HCT: 38.4 % (ref 36.0–46.0)
Hemoglobin: 12.9 g/dL (ref 12.0–15.0)
MCH: 31.1 pg (ref 26.0–34.0)
MCHC: 33.6 g/dL (ref 30.0–36.0)
MCV: 92.5 fL (ref 80.0–100.0)
Platelets: 345 10*3/uL (ref 150–400)
RBC: 4.15 MIL/uL (ref 3.87–5.11)
RDW: 12.3 % (ref 11.5–15.5)
WBC: 9.5 10*3/uL (ref 4.0–10.5)
nRBC: 0 % (ref 0.0–0.2)

## 2018-09-25 LAB — COMPREHENSIVE METABOLIC PANEL
ALT: 14 U/L (ref 0–44)
AST: 19 U/L (ref 15–41)
Albumin: 3.8 g/dL (ref 3.5–5.0)
Alkaline Phosphatase: 62 U/L (ref 38–126)
Anion gap: 11 (ref 5–15)
BUN: 19 mg/dL (ref 8–23)
CO2: 22 mmol/L (ref 22–32)
Calcium: 9.2 mg/dL (ref 8.9–10.3)
Chloride: 102 mmol/L (ref 98–111)
Creatinine, Ser: 1.04 mg/dL — ABNORMAL HIGH (ref 0.44–1.00)
GFR calc Af Amer: >60 mL/min (ref >60)
GFR calc non Af Amer: 58 mL/min — ABNORMAL LOW (ref >60)
Glucose, Bld: 89 mg/dL (ref 70–99)
Potassium: 3.8 mmol/L (ref 3.5–5.1)
Sodium: 135 mmol/L (ref 135–145)
Total Bilirubin: 0.8 mg/dL (ref 0.3–1.2)
Total Protein: 6.9 g/dL (ref 6.5–8.1)

## 2018-09-25 LAB — SURGICAL PCR SCREEN
MRSA, PCR: NEGATIVE
Staphylococcus aureus: POSITIVE — AB

## 2018-09-25 LAB — PROTIME-INR
INR: 1 (ref 0.8–1.2)
Prothrombin Time: 12.9 seconds (ref 11.4–15.2)

## 2018-09-25 LAB — APTT: aPTT: 29 seconds (ref 24–36)

## 2018-09-25 NOTE — Progress Notes (Signed)
[place], [place] [facility] [place] [address] [place] [address] Phone: [phone] Fax: [place] [address], [place] [address] Phone: [phone] Fax: [phone]      Your procedure is scheduled on Friday, September 25th, 2020.  Report to [facility] Main Entrance "A" at 5:30 A.M., and check in at the Admitting office.   Call this number if you have problems the morning of surgery:  [phone]  Call [phone] if you have any questions prior to your surgery date Monday-Friday 8am-4pm    Remember:  Do not eat after midnight the night before your surgery  You may drink clear liquids until 4:30AM the morning of your surgery.   Clear liquids allowed are: Water, Non-Citrus Juices (without pulp), Carbonated Beverages, Clear Tea, Black Coffee Only, and Gatorade  Please complete your PRE-SURGERY ENSURE that was provided to you by 4:30AM the morning of surgery.  Please, if able, drink it in one setting. DO NOT SIP.   Take these medicines the morning of surgery with A SIP OF WATER: Pravastatin (Pravachol) Tramadol (Ultram)  7 days prior to surgery STOP taking any Aspirin (unless otherwise instructed by your surgeon), Nabumetone (Relafen), Aleve, Naproxen, Ibuprofen, Motrin, Advil, Goody's, BC's, all herbal medications, fish oil, and all vitamins.    The Morning of Surgery  Do not wear jewelry, make-up or nail polish.  Do not wear lotions, powders, or perfumes/colognes, or deodorant  Do not shave 48 hours prior to surgery.  Men may shave face and neck.  Do not bring valuables to the hospital.  [facility] is not responsible for any belongings or valuables.  If you are a smoker, DO NOT Smoke 24 hours prior to surgery IF you wear a CPAP at night please bring your mask, tubing, and machine the morning of surgery   Remember that you must have someone to transport you  home after your surgery, and remain with you for 24 hours if you are discharged the same day.   Contacts, glasses, hearing aids, dentures or bridgework may not be worn into surgery.    Leave your suitcase in the car.  After surgery it may be brought to your room.  For patients admitted to the hospital, discharge time will be determined by your treatment team.  Patients discharged the day of surgery will not be allowed to drive home.    Special instructions:   [place]- Preparing For Surgery  Before surgery, you can play an important role. Because skin is not sterile, your skin needs to be as free of germs as possible. You can reduce the number of germs on your skin by washing with CHG (chlorahexidine gluconate) Soap before surgery.  CHG is an antiseptic cleaner which kills germs and bonds with the skin to continue killing germs even after washing.    Oral Hygiene is also important to reduce your risk of infection.  Remember - BRUSH YOUR TEETH THE MORNING OF SURGERY WITH YOUR REGULAR TOOTHPASTE  Please do not use if you have an allergy to CHG or antibacterial soaps. If your skin becomes reddened/irritated stop using the CHG.  Do not shave (including legs and underarms) for at least 48 hours prior to first CHG shower. It is OK to shave your face.  Please follow these instructions carefully.   1. Shower the NIGHT BEFORE SURGERY and the MORNING OF SURGERY with CHG  Soap.   2. If you chose to wash your hair, wash your hair first as usual with your normal shampoo.  3. After you shampoo, rinse your hair and body thoroughly to remove the shampoo.  4. Use CHG as you would any other liquid soap. You can apply CHG directly to the skin and wash gently with a scrungie or a clean washcloth.   5. Apply the CHG Soap to your body ONLY FROM THE NECK DOWN.  Do not use on open wounds or open sores. Avoid contact with your eyes, ears, mouth and genitals (private parts). Wash Face and genitals (private  parts)  with your normal soap.   6. Wash thoroughly, paying special attention to the area where your surgery will be performed.  7. Thoroughly rinse your body with warm water from the neck down.  8. DO NOT shower/wash with your normal soap after using and rinsing off the CHG Soap.  9. Pat yourself dry with a CLEAN TOWEL.  10. Wear CLEAN PAJAMAS to bed the night before surgery, wear comfortable clothes the morning of surgery  11. Place CLEAN SHEETS on your bed the night of your first shower and DO NOT SLEEP WITH PETS.    Day of Surgery:  Do not apply any deodorants/lotions. Please shower the morning of surgery with the CHG soap  Please wear clean clothes to the hospital/surgery center.   Remember to brush your teeth WITH YOUR REGULAR TOOTHPASTE.   Please read over the following fact sheets that you were given.

## 2018-09-25 NOTE — Progress Notes (Signed)
PCP - Jill Alexanders Cardiologist - denies  Chest x-ray - n/a EKG - 09-25-18 ECHO - 2009  ERAS Protcol - yes PRE-SURGERY Ensure - pt given Ensure drink  COVID TEST- Tuesday, 09-29-18   Anesthesia review: n/a  Patient denies shortness of breath, fever, cough and chest pain at PAT appointment   Patient verbalized understanding of instructions that were given to them at the PAT appointment. Patient was also instructed that they will need to review over the PAT instructions again at home before surgery.

## 2018-09-29 ENCOUNTER — Other Ambulatory Visit (HOSPITAL_COMMUNITY)
Admission: RE | Admit: 2018-09-29 | Discharge: 2018-09-29 | Disposition: A | Discharge status: Home / Self Care | Payer: 59 | Source: Ambulatory Visit | Attending: Orthopedic Surgery | Admitting: Orthopedic Surgery

## 2018-09-29 DIAGNOSIS — Z20828 Contact with and (suspected) exposure to other viral communicable diseases: Secondary | ICD-10-CM | POA: Insufficient documentation

## 2018-09-29 DIAGNOSIS — Z01812 Encounter for preprocedural laboratory examination: Secondary | ICD-10-CM | POA: Insufficient documentation

## 2018-09-29 LAB — SARS CORONAVIRUS 2 (TAT 6-24 HRS): SARS Coronavirus 2: NEGATIVE

## 2018-10-01 NOTE — Anesthesia Preprocedure Evaluation (Addendum)
Anesthesia Evaluation  Patient identified by MRN, date of birth, ID band Patient awake    Reviewed: Allergy & Precautions, NPO status , Patient's Chart, lab work & pertinent test results  Airway Mallampati: III  TM Distance: >3 FB Neck ROM: Full    Dental no notable dental hx.    Pulmonary neg pulmonary ROS,    Pulmonary exam normal breath sounds clear to auscultation       Cardiovascular hypertension, Pt. on medications Normal cardiovascular exam Rhythm:Regular Rate:Normal  ECG: NSR, rate 75   Neuro/Psych PSYCHIATRIC DISORDERS Anxiety Depression negative neurological ROS     GI/Hepatic Neg liver ROS, GERD  ,  Endo/Other  negative endocrine ROS  Renal/GU negative Renal ROS     Musculoskeletal  (+) Arthritis , Osteoarthritis,    Abdominal (+) + obese,   Peds  Hematology HLD   Anesthesia Other Findings Osteoarthritis Right Knee  Reproductive/Obstetrics                            Anesthesia Physical Anesthesia Plan  ASA: II  Anesthesia Plan: Regional and Spinal   Post-op Pain Management:  Regional for Post-op pain   Induction:   PONV Risk Score and Plan: 2 and Propofol infusion, Treatment may vary due to age or medical condition, Ondansetron, Dexamethasone and Midazolam  Airway Management Planned: Simple Face Mask  Additional Equipment:   Intra-op Plan:   Post-operative Plan:   Informed Consent: I have reviewed the patients History and Physical, chart, labs and discussed the procedure including the risks, benefits and alternatives for the proposed anesthesia with the patient or authorized representative who has indicated his/her understanding and acceptance.     Dental advisory given  Plan Discussed with: CRNA  Anesthesia Plan Comments:       Anesthesia Quick Evaluation

## 2018-10-02 ENCOUNTER — Inpatient Hospital Stay (HOSPITAL_COMMUNITY)
Admission: RE | Admit: 2018-10-02 | Discharge: 2018-10-04 | DRG: 470 | Disposition: A | Discharge status: Home / Self Care | Payer: 59 | Attending: Orthopedic Surgery | Admitting: Orthopedic Surgery

## 2018-10-02 ENCOUNTER — Inpatient Hospital Stay (HOSPITAL_COMMUNITY): Payer: 59

## 2018-10-02 ENCOUNTER — Encounter (HOSPITAL_COMMUNITY)
Admission: RE | Disposition: A | Discharge status: Home / Self Care | Payer: Self-pay | Source: Home / Self Care | Attending: Orthopedic Surgery

## 2018-10-02 ENCOUNTER — Other Ambulatory Visit: Payer: Self-pay

## 2018-10-02 ENCOUNTER — Encounter (HOSPITAL_COMMUNITY): Payer: Self-pay

## 2018-10-02 DIAGNOSIS — Z820 Family history of epilepsy and other diseases of the nervous system: Secondary | ICD-10-CM | POA: Diagnosis not present

## 2018-10-02 DIAGNOSIS — Z8342 Family history of familial hypercholesterolemia: Secondary | ICD-10-CM

## 2018-10-02 DIAGNOSIS — E785 Hyperlipidemia, unspecified: Secondary | ICD-10-CM | POA: Diagnosis present

## 2018-10-02 DIAGNOSIS — I10 Essential (primary) hypertension: Secondary | ICD-10-CM | POA: Diagnosis present

## 2018-10-02 DIAGNOSIS — Z79899 Other long term (current) drug therapy: Secondary | ICD-10-CM

## 2018-10-02 DIAGNOSIS — Z23 Encounter for immunization: Secondary | ICD-10-CM | POA: Diagnosis not present

## 2018-10-02 DIAGNOSIS — Z818 Family history of other mental and behavioral disorders: Secondary | ICD-10-CM | POA: Diagnosis not present

## 2018-10-02 DIAGNOSIS — Z96651 Presence of right artificial knee joint: Secondary | ICD-10-CM

## 2018-10-02 DIAGNOSIS — M1711 Unilateral primary osteoarthritis, right knee: Principal | ICD-10-CM

## 2018-10-02 DIAGNOSIS — Z79891 Long term (current) use of opiate analgesic: Secondary | ICD-10-CM

## 2018-10-02 DIAGNOSIS — Z20828 Contact with and (suspected) exposure to other viral communicable diseases: Secondary | ICD-10-CM | POA: Diagnosis present

## 2018-10-02 HISTORY — PX: TOTAL KNEE ARTHROPLASTY: SHX125

## 2018-10-02 SURGERY — ARTHROPLASTY, KNEE, TOTAL
Anesthesia: Regional | Site: Knee | Laterality: Right

## 2018-10-02 MED ORDER — ONDANSETRON HCL 4 MG/2ML IJ SOLN: 4.0000 mg | Freq: Once | INTRAMUSCULAR | Status: DC | PRN

## 2018-10-02 MED ORDER — METOCLOPRAMIDE HCL 5 MG PO TABS: 5.0000 mg | ORAL_TABLET | Freq: Three times a day (TID) | ORAL | Status: DC | PRN

## 2018-10-02 MED ORDER — PHENYLEPHRINE 40 MCG/ML (10ML) SYRINGE FOR IV PUSH (FOR BLOOD PRESSURE SUPPORT)
PREFILLED_SYRINGE | INTRAVENOUS | Status: DC | PRN
  Administered 2018-10-02 (×4): 40 ug via INTRAVENOUS
  Administered 2018-10-02: 60 ug via INTRAVENOUS

## 2018-10-02 MED ORDER — HYDROMORPHONE HCL 1 MG/ML IJ SOLN: 0.5000 mg | INTRAMUSCULAR | Status: DC | PRN

## 2018-10-02 MED ORDER — TRAMADOL HCL 50 MG PO TABS
50.0000 mg | ORAL_TABLET | Freq: Four times a day (QID) | ORAL | Status: DC
  Administered 2018-10-02 – 2018-10-04 (×9): 50 mg via ORAL
  Filled 2018-10-02 (×9): qty 1

## 2018-10-02 MED ORDER — TRANEXAMIC ACID-NACL 1000-0.7 MG/100ML-% IV SOLN
INTRAVENOUS | Status: AC
  Filled 2018-10-02: qty 100

## 2018-10-02 MED ORDER — BUPIVACAINE IN DEXTROSE 0.75-8.25 % IT SOLN
INTRATHECAL | Status: DC | PRN
  Administered 2018-10-02: 1.6 mL via INTRATHECAL

## 2018-10-02 MED ORDER — OXYCODONE HCL 5 MG PO TABS
5.0000 mg | ORAL_TABLET | ORAL | Status: DC | PRN
  Administered 2018-10-02 – 2018-10-04 (×3): 10 mg via ORAL
  Filled 2018-10-02 (×3): qty 2

## 2018-10-02 MED ORDER — LISINOPRIL 10 MG PO TABS
10.0000 mg | ORAL_TABLET | Freq: Every day | ORAL | Status: DC
  Administered 2018-10-02 – 2018-10-04 (×2): 10 mg via ORAL
  Filled 2018-10-02 (×4): qty 1

## 2018-10-02 MED ORDER — HYDROCHLOROTHIAZIDE 12.5 MG PO CAPS
12.5000 mg | ORAL_CAPSULE | Freq: Every day | ORAL | Status: DC
  Administered 2018-10-02 – 2018-10-04 (×2): 12.5 mg via ORAL
  Filled 2018-10-02 (×3): qty 1

## 2018-10-02 MED ORDER — FENTANYL CITRATE (PF) 100 MCG/2ML IJ SOLN
25.0000 ug | INTRAMUSCULAR | Status: DC | PRN
  Administered 2018-10-02 (×2): 50 ug via INTRAVENOUS

## 2018-10-02 MED ORDER — PHENOL 1.4 % MT LIQD: 1.0000 | OROMUCOSAL | Status: DC | PRN

## 2018-10-02 MED ORDER — STERILE WATER FOR IRRIGATION IR SOLN
Status: DC | PRN
  Administered 2018-10-02: 1000 mL

## 2018-10-02 MED ORDER — SODIUM CHLORIDE 0.9 % IR SOLN
Status: DC | PRN
  Administered 2018-10-02: 3000 mL

## 2018-10-02 MED ORDER — MIDAZOLAM HCL 5 MG/5ML IJ SOLN
INTRAMUSCULAR | Status: DC | PRN
  Administered 2018-10-02: 1 mg via INTRAVENOUS

## 2018-10-02 MED ORDER — FENTANYL CITRATE (PF) 250 MCG/5ML IJ SOLN
INTRAMUSCULAR | Status: AC
  Filled 2018-10-02: qty 5

## 2018-10-02 MED ORDER — TRANEXAMIC ACID 1000 MG/10ML IV SOLN
2000.0000 mg | Freq: Once | INTRAVENOUS | Status: DC
  Filled 2018-10-02: qty 20

## 2018-10-02 MED ORDER — TRANEXAMIC ACID-NACL 1000-0.7 MG/100ML-% IV SOLN
INTRAVENOUS | Status: DC | PRN
  Administered 2018-10-02: 1000 mg via INTRAVENOUS

## 2018-10-02 MED ORDER — MIDAZOLAM HCL 2 MG/2ML IJ SOLN
INTRAMUSCULAR | Status: AC
  Filled 2018-10-02: qty 2

## 2018-10-02 MED ORDER — ONDANSETRON HCL 4 MG/2ML IJ SOLN: 4.0000 mg | Freq: Four times a day (QID) | INTRAMUSCULAR | Status: DC | PRN

## 2018-10-02 MED ORDER — FENTANYL CITRATE (PF) 100 MCG/2ML IJ SOLN
INTRAMUSCULAR | Status: AC
  Filled 2018-10-02: qty 2

## 2018-10-02 MED ORDER — DOCUSATE SODIUM 100 MG PO CAPS
100.0000 mg | ORAL_CAPSULE | Freq: Two times a day (BID) | ORAL | Status: DC
  Administered 2018-10-02 – 2018-10-04 (×4): 100 mg via ORAL
  Filled 2018-10-02 (×5): qty 1

## 2018-10-02 MED ORDER — LISINOPRIL-HYDROCHLOROTHIAZIDE 10-12.5 MG PO TABS: 1.0000 | ORAL_TABLET | Freq: Every day | ORAL | Status: DC

## 2018-10-02 MED ORDER — PROPOFOL 10 MG/ML IV BOLUS
INTRAVENOUS | Status: DC | PRN
  Administered 2018-10-02 (×2): 20 mg via INTRAVENOUS

## 2018-10-02 MED ORDER — PRAVASTATIN SODIUM 40 MG PO TABS
40.0000 mg | ORAL_TABLET | Freq: Every day | ORAL | Status: DC
  Administered 2018-10-03 – 2018-10-04 (×2): 40 mg via ORAL
  Filled 2018-10-02 (×3): qty 1

## 2018-10-02 MED ORDER — LACTATED RINGERS IV SOLN
INTRAVENOUS | Status: DC
  Administered 2018-10-02: 07:00:00 via INTRAVENOUS

## 2018-10-02 MED ORDER — CEFAZOLIN SODIUM-DEXTROSE 2-4 GM/100ML-% IV SOLN
INTRAVENOUS | Status: AC
  Filled 2018-10-02: qty 100

## 2018-10-02 MED ORDER — ONDANSETRON HCL 4 MG/2ML IJ SOLN
INTRAMUSCULAR | Status: DC | PRN
  Administered 2018-10-02: 4 mg via INTRAVENOUS

## 2018-10-02 MED ORDER — METOCLOPRAMIDE HCL 5 MG/ML IJ SOLN: 5.0000 mg | Freq: Three times a day (TID) | INTRAMUSCULAR | Status: DC | PRN

## 2018-10-02 MED ORDER — INFLUENZA VAC SPLIT QUAD 0.5 ML IM SUSY
0.5000 mL | PREFILLED_SYRINGE | INTRAMUSCULAR | Status: AC
  Administered 2018-10-03: 0.5 mL via INTRAMUSCULAR
  Filled 2018-10-02: qty 0.5

## 2018-10-02 MED ORDER — OXYCODONE HCL 5 MG PO TABS
10.0000 mg | ORAL_TABLET | ORAL | Status: DC | PRN
  Administered 2018-10-02 – 2018-10-03 (×4): 15 mg via ORAL
  Filled 2018-10-02 (×4): qty 3

## 2018-10-02 MED ORDER — MENTHOL 3 MG MT LOZG: 1.0000 | LOZENGE | OROMUCOSAL | Status: DC | PRN

## 2018-10-02 MED ORDER — ONDANSETRON HCL 4 MG PO TABS: 4.0000 mg | ORAL_TABLET | Freq: Four times a day (QID) | ORAL | Status: DC | PRN

## 2018-10-02 MED ORDER — ACETAMINOPHEN 325 MG PO TABS: 325.0000 mg | ORAL_TABLET | Freq: Four times a day (QID) | ORAL | Status: DC | PRN

## 2018-10-02 MED ORDER — FENTANYL CITRATE (PF) 100 MCG/2ML IJ SOLN
INTRAMUSCULAR | Status: DC | PRN
  Administered 2018-10-02: 25 ug via INTRAVENOUS
  Administered 2018-10-02: 75 ug via INTRAVENOUS

## 2018-10-02 MED ORDER — ACETAMINOPHEN 500 MG PO TABS
1000.0000 mg | ORAL_TABLET | Freq: Once | ORAL | Status: AC
  Administered 2018-10-02: 06:00:00 1000 mg via ORAL

## 2018-10-02 MED ORDER — CHLORHEXIDINE GLUCONATE 4 % EX LIQD: 60.0000 mL | Freq: Once | CUTANEOUS | Status: DC

## 2018-10-02 MED ORDER — CEFAZOLIN SODIUM-DEXTROSE 2-4 GM/100ML-% IV SOLN
2.0000 g | INTRAVENOUS | Status: AC
  Administered 2018-10-02: 2 g via INTRAVENOUS

## 2018-10-02 MED ORDER — PROPOFOL 500 MG/50ML IV EMUL
INTRAVENOUS | Status: DC | PRN
  Administered 2018-10-02: 75 ug/kg/min via INTRAVENOUS

## 2018-10-02 MED ORDER — ACETAMINOPHEN 500 MG PO TABS
ORAL_TABLET | ORAL | Status: AC
  Administered 2018-10-02: 1000 mg via ORAL
  Filled 2018-10-02: qty 2

## 2018-10-02 MED ORDER — ASPIRIN EC 325 MG PO TBEC
325.0000 mg | DELAYED_RELEASE_TABLET | Freq: Every day | ORAL | Status: DC
  Administered 2018-10-03 – 2018-10-04 (×2): 325 mg via ORAL
  Filled 2018-10-02 (×2): qty 1

## 2018-10-02 MED ORDER — 0.9 % SODIUM CHLORIDE (POUR BTL) OPTIME
TOPICAL | Status: DC | PRN
  Administered 2018-10-02: 1000 mL

## 2018-10-02 MED ORDER — PROPOFOL 10 MG/ML IV BOLUS
INTRAVENOUS | Status: AC
  Filled 2018-10-02: qty 20

## 2018-10-02 MED ORDER — DEXAMETHASONE SODIUM PHOSPHATE 10 MG/ML IJ SOLN
INTRAMUSCULAR | Status: DC | PRN
  Administered 2018-10-02: 5 mg via INTRAVENOUS

## 2018-10-02 MED ORDER — NABUMETONE 500 MG PO TABS
500.0000 mg | ORAL_TABLET | Freq: Every day | ORAL | Status: DC
  Administered 2018-10-03 – 2018-10-04 (×2): 500 mg via ORAL
  Filled 2018-10-02 (×3): qty 1

## 2018-10-02 MED ORDER — SODIUM CHLORIDE 0.9 % IV SOLN
INTRAVENOUS | Status: DC | PRN
  Administered 2018-10-02: 25 ug/min via INTRAVENOUS

## 2018-10-02 MED ORDER — ROPIVACAINE HCL 5 MG/ML IJ SOLN
INTRAMUSCULAR | Status: DC | PRN
  Administered 2018-10-02: 30 mL via PERINEURAL

## 2018-10-02 MED ORDER — TRANEXAMIC ACID 1000 MG/10ML IV SOLN
INTRAVENOUS | Status: DC | PRN
  Administered 2018-10-02: 2000 mg via TOPICAL

## 2018-10-02 SURGICAL SUPPLY — 56 items
ATTUNE PS FEM RT SZ 4 CEM KNEE (Femur) ×1 IMPLANT
BASEPLATE TIB CMT FB PCKT SZ5 (Knees) ×1 IMPLANT
BLADE SAGITTAL 25.0X1.19X90 (BLADE) ×3 IMPLANT
BLADE SAW SGTL 13X75X1.27 (BLADE) ×2 IMPLANT
BLADE SURG 21 STRL SS (BLADE) ×4 IMPLANT
BNDG COHESIVE 6X5 TAN STRL LF (GAUZE/BANDAGES/DRESSINGS) ×3 IMPLANT
BNDG GAUZE ELAST 4 BULKY (GAUZE/BANDAGES/DRESSINGS) ×2 IMPLANT
BOWL SMART MIX CTS (DISPOSABLE) ×2 IMPLANT
BSPLAT TIB 5 CMNT FXBRNG STRL (Knees) ×1 IMPLANT
CEMENT BONE R 1X40 (Cement) ×4 IMPLANT
COVER SURGICAL LIGHT HANDLE (MISCELLANEOUS) ×2 IMPLANT
COVER WAND RF STERILE (DRAPES) ×2 IMPLANT
CUFF TOURN SGL QUICK 34 (TOURNIQUET CUFF) ×2
CUFF TOURN SGL QUICK 42 (TOURNIQUET CUFF) IMPLANT
CUFF TRNQT CYL 34X4.125X (TOURNIQUET CUFF) ×1 IMPLANT
DRAPE EXTREMITY T 121X128X90 (DISPOSABLE) ×2 IMPLANT
DRAPE HALF SHEET 40X57 (DRAPES) ×4 IMPLANT
DRAPE U-SHAPE 47X51 STRL (DRAPES) ×2 IMPLANT
DRSG ADAPTIC 3X8 NADH LF (GAUZE/BANDAGES/DRESSINGS) ×2 IMPLANT
DRSG EMULSION OIL 3X3 NADH (GAUZE/BANDAGES/DRESSINGS) ×1 IMPLANT
DRSG PAD ABDOMINAL 8X10 ST (GAUZE/BANDAGES/DRESSINGS) ×2 IMPLANT
DURAPREP 26ML APPLICATOR (WOUND CARE) ×2 IMPLANT
ELECT REM PT RETURN 9FT ADLT (ELECTROSURGICAL) ×2
ELECTRODE REM PT RTRN 9FT ADLT (ELECTROSURGICAL) ×1 IMPLANT
FACESHIELD WRAPAROUND (MASK) ×2 IMPLANT
FACESHIELD WRAPAROUND OR TEAM (MASK) ×1 IMPLANT
GAUZE SPONGE 4X4 12PLY STRL (GAUZE/BANDAGES/DRESSINGS) ×2 IMPLANT
GAUZE SPONGE 4X4 12PLY STRL LF (GAUZE/BANDAGES/DRESSINGS) ×1 IMPLANT
GLOVE BIOGEL PI IND STRL 9 (GLOVE) ×1 IMPLANT
GLOVE BIOGEL PI INDICATOR 9 (GLOVE) ×1
GLOVE SURG ORTHO 9.0 STRL STRW (GLOVE) ×2 IMPLANT
GOWN STRL REUS W/ TWL XL LVL3 (GOWN DISPOSABLE) ×2 IMPLANT
GOWN STRL REUS W/TWL XL LVL3 (GOWN DISPOSABLE) ×4
HANDPIECE INTERPULSE COAX TIP (DISPOSABLE) ×2
INSERT TIB ATTUNE FB SZ4X5 (Insert) ×1 IMPLANT
KIT BASIN OR (CUSTOM PROCEDURE TRAY) ×2 IMPLANT
KIT TURNOVER KIT B (KITS) ×2 IMPLANT
MANIFOLD NEPTUNE II (INSTRUMENTS) ×2 IMPLANT
NS IRRIG 1000ML POUR BTL (IV SOLUTION) ×2 IMPLANT
PACK TOTAL JOINT (CUSTOM PROCEDURE TRAY) ×2 IMPLANT
PAD ABD 8X10 STRL (GAUZE/BANDAGES/DRESSINGS) ×1 IMPLANT
PAD ARMBOARD 7.5X6 YLW CONV (MISCELLANEOUS) ×2 IMPLANT
PATELLA MEDIAL ATTUN 35MM KNEE (Knees) ×1 IMPLANT
PIN DRILL FIX HALF THREAD (BIT) ×1 IMPLANT
PIN STEINMAN FIXATION KNEE (PIN) ×1 IMPLANT
SET HNDPC FAN SPRY TIP SCT (DISPOSABLE) ×1 IMPLANT
STAPLER VISISTAT 35W (STAPLE) ×2 IMPLANT
SUCTION FRAZIER HANDLE 10FR (MISCELLANEOUS)
SUCTION TUBE FRAZIER 10FR DISP (MISCELLANEOUS) IMPLANT
SUT VIC AB 0 CT1 27 (SUTURE) ×2
SUT VIC AB 0 CT1 27XBRD ANBCTR (SUTURE) ×1 IMPLANT
SUT VIC AB 1 CTX 36 (SUTURE)
SUT VIC AB 1 CTX36XBRD ANBCTR (SUTURE) IMPLANT
TOWEL GREEN STERILE (TOWEL DISPOSABLE) ×2 IMPLANT
TOWEL GREEN STERILE FF (TOWEL DISPOSABLE) ×2 IMPLANT
WRAP KNEE MAXI GEL POST OP (GAUZE/BANDAGES/DRESSINGS) ×2 IMPLANT

## 2018-10-02 NOTE — Op Note (Addendum)
DATE OF SURGERY:  10/02/2018  TIME: 9:14 AM  PATIENT NAME:  Amber Church    AGE: 63 y.o.    PRE-OPERATIVE DIAGNOSIS:  Osteoarthritis Right Knee  POST-OPERATIVE DIAGNOSIS:  Osteoarthritis Right Knee  PROCEDURE:  Procedure(s): RIGHT TOTAL KNEE ARTHROPLASTY  SURGEON: Meridee Score  ASSISTANT: carla bethune  OPERATIVE IMPLANTS: Depuy , Posterior Stabilized.  Femur size 4, Tibia size 5, Patella size 35 3-peg oval button, with a 5 mm polyethylene insert.  @ENCIMAGES @       PREOPERATIVE INDICATIONS:   Amber Church is a 63 y.o. year old female with end stage degenerative arthritis of the knee who failed conservative treatment and elected for Total Knee Arthroplasty.   The risks, benefits, and alternatives were discussed at length including but not limited to the risks of infection, bleeding, nerve injury, stiffness, blood clots, the need for revision surgery, cardiopulmonary complications, among others, and they were willing to proceed.  OPERATIVE DESCRIPTION:  The patient was brought to the operative room and placed in a supine position.  General anesthesia was administered.  IV antibiotics were given.  The lower extremity was prepped and draped in the usual sterile fashion.  Charlie Pitter was used to cover all exposed skin. Time out was performed.    Anterior quadriceps tendon splitting approach was performed.  The patella was everted and osteophytes were removed.  The anterior horn of the medial and lateral meniscus was removed.   The distal femur was opened with the drill and the intramedullary distal femoral cutting jig was utilized, set at 5 degrees valgus resecting 9 mm off the distal femur.  Care was taken to protect the collateral ligaments.  Then the extramedullary tibial cutting jig was utilized set for 3 degree posterior slope.  Care was taken during the cut to protect the medial and collateral ligaments.  The proximal tibia was removed along with the posterior horns  of the menisci.  The PCL was sacrificed.    The extensor gap was measured and was approximately 5 mm.    The distal femoral sizing jig was applied, taking care to avoid notching.  Then the 4-in-1 cutting jig was applied and the anterior and posterior femur was cut, along with the chamfer cuts.  All posterior osteophytes were removed.  The flexion gap was then measured and was symmetric with the extension gap.  The distal femoral preparation using the appropriate jig to prepare the box.  The patella was then measured, and cut with the saw.    The proximal tibia sized and prepared accordingly with the reamer and the punch, and then all components were trialed with the poly insert.  The knee was found to have stable balance and full motion.  The knee was irrigated with normal saline and the knee was soaked with TXA.  The above named components were then cemented into place and all excess cement was removed.  The final polyethylene component was in place during cementation.  The knee was kept in extension until the cement hardened.  The knee was then taken through a range of motion and the patella tracked well and the knee irrigated copiously and the parapatellar and subcutaneous tissue closed with vicryl, and skin closed with staples..  A sterile dressing was applied and patient  was taken to the PACU in stable  condition.  There were no complications.  Total tourniquet time was 33 minutes.

## 2018-10-02 NOTE — Evaluation (Signed)
Physical Therapy Evaluation Patient Details Name: Amber Church MRN: [id number] DOB: 1955-07-30 Today's Date: 10/02/2018   History of Present Illness  63yo female s/p R TKR done 10/02/18. PMH anxiety, HLD, HTN, hx B achillles surgeries, obesity  Clinical Impression  Patient received in bed, pleasant and willing to participate in PT session today. Able to complete bed mobility with minA to support R LE, otherwise able to perform all mobility with min guard and RW today including functional transfers and gait approximately 60f in room. She did have spell of urinary urgency in standing and [facility] cues and close min guard for safety to prevent fall. She was left up in the chair with all needs met and chair alarm active, RN aware of patient status. She will continue to benefit from skilled PT services in the acute setting, also recommend skilled therapies as per surgeon's preference moving forward.     Follow Up Recommendations Follow surgeon's recommendation for DC plan and follow-up therapies    Equipment Recommendations  Rolling walker with 5" wheels;3in1 (PT)    Recommendations for Other Services       Precautions / Restrictions Precautions Precautions: Fall;Other (comment) Precaution Comments: anxious, can panic and get unsafe sometimes Restrictions Weight Bearing Restrictions: Yes RLE Weight Bearing: Weight bearing as tolerated      Mobility  Bed Mobility Overal bed mobility: Needs Assistance Bed Mobility: Supine to Sit     Supine to sit: Min assist     General bed mobility comments: MinA to support R LE  Transfers Overall transfer level: Needs assistance Equipment used: Rolling walker (2 wheeled) Transfers: Sit to/from [facility] to Stand: Min guard         General transfer comment: min guard for safety, cues for hand placement and sequencing; panicked during stand-pivot to commode and required Max  cues/close min guard to maintain safety  Ambulation/Gait Ambulation/Gait assistance: Min guard Gait Distance (Feet): 10 Feet Assistive device: Rolling walker (2 wheeled) Gait Pattern/deviations: Step-to pattern;Decreased step length - left;Decreased stance time - right;Decreased stride length;Decreased dorsiflexion - right;Decreased weight shift to right;Antalgic;Trunk flexed Gait velocity: decreased   General Gait Details: antalgic gait pattern, cues for correct use of RW and sequencing  Stairs            Wheelchair Mobility    Modified Rankin (Stroke Patients Only)       Balance Overall balance assessment: Mild deficits observed, not formally tested                                           Pertinent Vitals/Pain Pain Assessment: 0-10 Pain Score: 5  Pain Location: R knee Pain Descriptors / Indicators: Aching;Sore Pain Intervention(s): Limited activity within patient's tolerance;Monitored during session;Patient requesting pain meds-RN notified    Home Living Family/patient expects to be discharged to:: Private residence Living Arrangements: Spouse/significant other;Other relatives;Non-relatives/Friends Available Help at Discharge: Family;Friend(s);Available PRN/intermittently Type of Home: House Home Access: Ramped entrance     Home Layout: One level Home Equipment: Crutches;Cane - single point Additional Comments: between her husband, sisters, and friends does have access to 24/7 care    Prior Function Level of Independence: Independent         Comments: very independent before, no device but has habitual limp     Hand Dominance        Extremity/Trunk  Assessment   Upper Extremity Assessment Upper Extremity Assessment: Generalized weakness    Lower Extremity Assessment Lower Extremity Assessment: Generalized weakness    Cervical / Trunk Assessment Cervical / Trunk Assessment: Normal  Communication   Communication: No  difficulties  Cognition Arousal/Alertness: Awake/alert Behavior During Therapy: WFL for tasks assessed/performed;Anxious Overall Cognitive Status: Within Functional Limits for tasks assessed                                        General Comments      Exercises     Assessment/Plan    PT Assessment Patient needs continued PT services  PT Problem List Decreased strength;Decreased mobility;Decreased safety awareness;Decreased coordination;Decreased range of motion;Obesity;Decreased knowledge of precautions;Decreased activity tolerance;Decreased balance;Pain;Decreased knowledge of use of DME       PT Treatment Interventions DME instruction;Therapeutic activities;Gait training;Therapeutic exercise;Patient/family education;Stair training;Balance training;Functional mobility training;Neuromuscular re-education    PT Goals (Current goals can be found in the Care Plan section)  Acute Rehab PT Goals Patient Stated Goal: go home PT Goal Formulation: With patient Time For Goal Achievement: 10/16/18 Potential to Achieve Goals: Good    Frequency 7X/week   Barriers to discharge        Co-evaluation               AM-PAC PT "6 Clicks" Mobility  Outcome Measure Help needed turning from your back to your side while in a flat bed without using bedrails?: A Little Help needed moving from lying on your back to sitting on the side of a flat bed without using bedrails?: A Little Help needed moving to and from a bed to a chair (including a wheelchair)?: A Little Help needed standing up from a chair using your arms (e.g., wheelchair or bedside chair)?: A Little Help needed to walk in hospital room?: A Little Help needed climbing 3-5 steps with a railing? : A Lot 6 Click Score: 17    End of Session Equipment Utilized During Treatment: Gait belt Activity Tolerance: Patient tolerated treatment well Patient left: in chair;with call bell/phone within reach;with chair alarm  set Nurse Communication: Mobility status PT Visit Diagnosis: Muscle weakness (generalized) (M62.81);Difficulty in walking, not elsewhere classified (R26.2);Pain Pain - Right/Left: Right Pain - part of body: Knee    Time: 2336-1224 PT Time Calculation (min) (ACUTE ONLY): 27 min   Charges:   PT Evaluation $PT Eval Low Complexity: 1 Low PT Treatments $Gait Training: 8-22 mins        Deniece Ree PT, DPT, CBIS  Supplemental Physical Therapist [place]    Pager [phone] Acute Rehab Office [phone]

## 2018-10-02 NOTE — Care Management (Signed)
CM consult acknowledged to assist with any HH/DME needs. Awaiting PT/OT eval for DCP recommendations and will continue to follow.  Jewels Langone RN, BSN, NCM-BC, ACM-RN [phone] 

## 2018-10-02 NOTE — Transfer of Care (Signed)
Immediate Anesthesia Transfer of Care Note  Patient: Amber Church  Procedure(s) Performed: RIGHT TOTAL KNEE ARTHROPLASTY (Right Knee)  Patient Location: PACU  Anesthesia Type:Spinal and MAC combined with regional for post-op pain  Level of Consciousness: awake, oriented and drowsy  Airway & Oxygen Therapy: Patient Spontanous Breathing and Patient connected to face mask oxygen  Post-op Assessment: Report given to RN and Post -op Vital signs reviewed and stable  Post vital signs: Reviewed and stable  Last Vitals:  Vitals Value Taken Time  BP 99/71 10/02/18 0923  Temp    Pulse 74 10/02/18 0924  Resp 17 10/02/18 0924  SpO2 100 % 10/02/18 0924  Vitals shown include unvalidated device data.  Last Pain:  Vitals:   10/02/18 0612  TempSrc:   PainSc: 6       Patients Stated Pain Goal: 1 (38/75/64 3329)  Complications: No apparent anesthesia complications

## 2018-10-02 NOTE — Anesthesia Procedure Notes (Signed)
Anesthesia Regional Block: Adductor canal block   Pre-Anesthetic Checklist: ,, timeout performed, Correct Patient, Correct Site, Correct Laterality, Correct Procedure,, site marked, risks and benefits discussed, Surgical consent,  Pre-op evaluation,  At surgeon's request and post-op pain management  Laterality: Right  Prep: chloraprep       Needles:  Injection technique: Single-shot  Needle Type: Echogenic Stimulator Needle     Needle Length: 10cm  Needle Gauge: 21     Additional Needles:   Procedures:,,,, ultrasound used (permanent image in chart),,,,  Narrative:  Start time: 10/02/2018 6:55 AM End time: 10/02/2018 7:05 AM Injection made incrementally with aspirations every 5 mL.  Performed by: Personally  Anesthesiologist: Murvin Natal, MD  Additional Notes: Functioning IV was confirmed and monitors were applied. A time-out was performed. Hand hygiene and sterile gloves were used. The thigh was placed in a frog-leg position and prepped in a sterile fashion. A 153mm 21ga [place] echogenic stimulator needle was placed using ultrasound guidance.  Negative aspiration and negative test dose prior to incremental administration of local anesthetic. The patient tolerated the procedure well.

## 2018-10-02 NOTE — Anesthesia Postprocedure Evaluation (Signed)
Anesthesia Post Note  Patient: Amber Church  Procedure(s) Performed: RIGHT TOTAL KNEE ARTHROPLASTY (Right Knee)     Patient location during evaluation: PACU Anesthesia Type: Regional and Spinal Level of consciousness: oriented and awake and alert Pain management: pain level controlled Vital Signs Assessment: post-procedure vital signs reviewed and stable Respiratory status: spontaneous breathing, respiratory function stable and patient connected to nasal cannula oxygen Cardiovascular status: blood pressure returned to baseline and stable Postop Assessment: no headache, no backache, no apparent nausea or vomiting and spinal receding Anesthetic complications: no    Last Vitals:  Vitals:   10/02/18 1107 10/02/18 1522  BP: 94/83 119/78  Pulse: 68 80  Resp: 17 16  Temp: 36.4 C   SpO2:  100%    Last Pain:  Vitals:   10/02/18 1300  TempSrc:   PainSc: 6                  [name] P Timothee Gali

## 2018-10-02 NOTE — Anesthesia Procedure Notes (Signed)
Spinal  Patient location during procedure: OR Start time: 10/02/2018 7:35 AM End time: 10/02/2018 7:45 AM Staffing Anesthesiologist: Murvin Natal, MD Performed: anesthesiologist  Preanesthetic Checklist Completed: patient identified, surgical consent, pre-op evaluation, timeout performed, IV checked, risks and benefits discussed and monitors and equipment checked Spinal Block Patient position: sitting Prep: DuraPrep Patient monitoring: cardiac monitor, continuous pulse ox and blood pressure Approach: midline Location: L4-5 Injection technique: single-shot Needle Needle type: Pencan  Needle gauge: 24 G Needle length: 9 cm Assessment Sensory level: T10 Additional Notes Functioning IV was confirmed and monitors were applied. Sterile prep and drape, including hand hygiene and sterile gloves were used. The patient was positioned and the spine was prepped. The skin was anesthetized with lidocaine.  Free flow of clear CSF was obtained prior to injecting local anesthetic into the CSF.  The spinal needle aspirated freely following injection.  The needle was carefully withdrawn.  The patient tolerated the procedure well.

## 2018-10-02 NOTE — H&P (Signed)
TOTAL KNEE ADMISSION H&P  Patient is being admitted for right total knee arthroplasty.  Subjective:  Chief Complaint:right knee pain.  HPI: Amber Church, 63 y.o. female, has a history of pain and functional disability in the right knee due to arthritis and has failed non-surgical conservative treatments for greater than 12 weeks to includeNSAID's and/or analgesics, corticosteriod injections, use of assistive devices, weight reduction as appropriate and activity modification.  Onset of symptoms was gradual, starting 8 years ago with gradually worsening course since that time. The patient noted no past surgery on the right knee(s).  Patient currently rates pain in the right knee(s) at 8 out of 10 with activity. Patient has night pain, worsening of pain with activity and weight bearing, pain that interferes with activities of daily living, pain with passive range of motion, crepitus and joint swelling.  Patient has evidence of subchondral cysts, subchondral sclerosis, periarticular osteophytes, joint subluxation and joint space narrowing by imaging studies. This patient has had avascular necrosis of the knee. There is no active infection.  Patient Active Problem List   Diagnosis Date Noted  . Achilles tendonitis, bilateral 05/13/2016  . Achilles tendon contracture, bilateral 05/13/2016  . Hypertension 08/06/2010  . Hyperlipidemia LDL goal <100 08/06/2010  . Obesity (BMI 30-39.9) 08/06/2010   Past Medical History:  Diagnosis Date  . Abnormal EKG   . Abnormal pap    s/p cryotherapy  . Anxiety   . Arthritis   . Depression   . Diverticulosis   . GERD (gastroesophageal reflux disease)   . Hyperlipidemia   . Hypertension   . S/P colonoscopic polypectomy     Past Surgical History:  Procedure Laterality Date  . COLONOSCOPY  03/18/11; 2000   Dr. Amedeo Plenty; diverticulosis in sigmoid colon  . crysurgery  age 70   for abnormal pap  . LAMINECTOMY  03/2002    Current Facility-Administered  Medications  Medication Dose Route Frequency Provider Last Rate Last Dose  . ceFAZolin (ANCEF) 2-4 GM/100ML-% IVPB           . ceFAZolin (ANCEF) IVPB 2g/100 mL premix  2 g Intravenous On Call to OR Newt Minion, MD      . chlorhexidine (HIBICLENS) 4 % liquid 4 application  60 mL Topical Once Newt Minion, MD      . lactated ringers infusion   Intravenous Continuous Nolon Nations, MD       No Known Allergies  Social History   Tobacco Use  . Smoking status: Never Smoker  . Smokeless tobacco: Never Used  Substance Use Topics  . Alcohol use: Yes    Comment: glass of wine 1-2 times per week.    Family History  Problem Relation Age of Onset  . Alzheimer's disease Mother   . Hyperlipidemia Mother   . Other Father        died of gunshot wound  . Osteoporosis Sister   . Osteoporosis Sister   . ALS Maternal Aunt   . ALS Maternal Uncle   . Diabetes Paternal Aunt   . Cancer Maternal Grandfather        lung cancer  . Cancer Paternal Grandmother        ?type  . Heart disease Neg Hx      Review of Systems  All other systems reviewed and are negative.   Objective:  Physical Exam  Vital signs in last 24 hours: Temp:  [98.3 F (36.8 C)] 98.3 F (36.8 C) (09/25 0554) Pulse Rate:  [96]  96 (09/25 0554) Resp:  [20] 20 (09/25 0554) BP: (119)/(89) 119/89 (09/25 0554) SpO2:  [96 %] 96 % (09/25 0554) Weight:  [95.3 kg] 95.3 kg (09/25 0554)  Labs:   Estimated body mass index is 36.06 kg/m as calculated from the following:   Height as of this encounter: 5\' 4"  (1.626 m).   Weight as of this encounter: 95.3 kg.   Imaging Review Plain radiographs demonstrate moderate degenerative joint disease of the right knee(s). The overall alignment ismild varus. The bone quality appears to be adequate for age and reported activity level.      Assessment/Plan:  End stage arthritis, right knee   The patient history, physical examination, clinical judgment of the provider and imaging  studies are consistent with end stage degenerative joint disease of the right knee(s) and total knee arthroplasty is deemed medically necessary. The treatment options including medical management, injection therapy arthroscopy and arthroplasty were discussed at length. The risks and benefits of total knee arthroplasty were presented and reviewed. The risks due to aseptic loosening, infection, stiffness, patella tracking problems, thromboembolic complications and other imponderables were discussed. The patient acknowledged the explanation, agreed to proceed with the plan and consent was signed. Patient is being admitted for inpatient treatment for surgery, pain control, PT, OT, prophylactic antibiotics, VTE prophylaxis, progressive ambulation and ADL's and discharge planning. The patient is planning to be discharged home with home health services     Patient's anticipated LOS is less than 2 midnights, meeting these requirements: - Younger than 28 - Lives within 1 hour of care - Has a competent adult at home to recover with post-op recover - NO history of  - Chronic pain requiring opiods  - Diabetes  - Coronary Artery Disease  - Heart failure  - Heart attack  - Stroke  - DVT/VTE  - Cardiac arrhythmia  - Respiratory Failure/COPD  - Renal failure  - Anemia  - Advanced Liver disease

## 2018-10-02 NOTE — Progress Notes (Addendum)
Patient arrived to the unit via bed from PACU. Patient is alert and oriented. VSS, NAD. Dressing to right knee is clean, dry, and intact. Knee immobilizer is in place. Bed is in the lowest and locked position with bed rails up times 3. Belongings and call bell within reach. Pain meds have been administered per MAR. Sister has been notified of patients arrival to the unit.

## 2018-10-03 MED ORDER — OXYCODONE-ACETAMINOPHEN 5-325 MG PO TABS: 1.0000 | ORAL_TABLET | ORAL | 0 refills | Status: DC | PRN

## 2018-10-03 NOTE — Progress Notes (Signed)
Physical Therapy Treatment Patient Details Name: Amber Church MRN: [id number] DOB: 06/24/1955 Today's Date: 10/03/2018    History of Present Illness Pt is a 63 y.o. female s/p R TKR on 10/02/18. PMH includes anxiety, HLD, HTN, hx B achillles surgeries, obesity.   PT Comments    Pt progressing well with mobility. Able to progress ambulation distance with RW, with supervision to min guard for safety. Reviewed positioning, precautions and therex. Pt motivated for return home tomorrow.   Follow Up Recommendations  Follow surgeon's recommendation for DC plan and follow-up therapies;Home health PT     Equipment Recommendations  Rolling walker with 5" wheels;3in1 (PT)    Recommendations for Other Services       Precautions / Restrictions Precautions Precautions: Fall;Knee Precaution Comments: Reviewed precautions and positioning Restrictions Weight Bearing Restrictions: Yes RLE Weight Bearing: Weight bearing as tolerated    Mobility  Bed Mobility Overal bed mobility: Modified Independent             General bed mobility comments: HOB elevated and use of bed rail, cued pt to use LLE/BUEs to assist RLE to EOB  Transfers Overall transfer level: Needs assistance Equipment used: Rolling walker (2 wheeled) Transfers: Sit to/from Stand Sit to Stand: Supervision         General transfer comment: Able to stand from bed and BSC (over toilet) with RW and supervision; 1x cues for hand placement  Ambulation/Gait Ambulation/Gait assistance: Min guard;Supervision Gait Distance (Feet): 120 Feet Assistive device: Rolling walker (2 wheeled) Gait Pattern/deviations: Step-through pattern;Decreased stride length;Decreased weight shift to right;Antalgic Gait velocity: Decreased Gait velocity interpretation: <1.31 ft/sec, indicative of household ambulator General Gait Details: Very slow, antalgic gait with RW and intermittent min guard; cues to increase step length and heel-to-toe  gait pattern   Stairs             Wheelchair Mobility    Modified Rankin (Stroke Patients Only)       Balance Overall balance assessment: Needs assistance   Sitting balance-Leahy Scale: Good       Standing balance-Leahy Scale: Fair Standing balance comment: Can static stand at sink to perform ADL tasks withotu UE support                            Cognition Arousal/Alertness: Awake/alert Behavior During Therapy: WFL for tasks assessed/performed;Anxious Overall Cognitive Status: Within Functional Limits for tasks assessed                                        Exercises Total Joint Exercises Long Arc Quad: AROM;Right;Seated Knee Flexion: AAROM;Right;Seated(with use of LLE for added stretch)    General Comments        Pertinent Vitals/Pain Pain Assessment: Faces Faces Pain Scale: Hurts little more Pain Location: R knee Pain Descriptors / Indicators: Aching;Sore Pain Intervention(s): Patient requesting pain meds-RN notified    Home Living                      Prior Function            PT Goals (current goals can now be found in the care plan section) Progress towards PT goals: Progressing toward goals    Frequency    7X/week      PT Plan Current plan remains appropriate    Co-evaluation  AM-PAC PT "6 Clicks" Mobility   Outcome Measure  Help needed turning from your back to your side while in a flat bed without using bedrails?: None Help needed moving from lying on your back to sitting on the side of a flat bed without using bedrails?: A Little Help needed moving to and from a bed to a chair (including a wheelchair)?: A Little Help needed standing up from a chair using your arms (e.g., wheelchair or bedside chair)?: A Little Help needed to walk in hospital room?: A Little Help needed climbing 3-5 steps with a railing? : A Little 6 Click Score: 19    End of Session Equipment Utilized  During Treatment: Gait belt Activity Tolerance: Patient tolerated treatment well Patient left: in chair;with call bell/phone within reach;with chair alarm set Nurse Communication: Mobility status PT Visit Diagnosis: Muscle weakness (generalized) (M62.81);Difficulty in walking, not elsewhere classified (R26.2);Pain Pain - Right/Left: Right Pain - part of body: Knee     Time: [id number] PT Time Calculation (min) (ACUTE ONLY): 25 min  Charges:  $Gait Training: 8-22 mins $Therapeutic Activity: 8-22 mins                    Mabeline Caras, PT, DPT Acute Rehabilitation Services  Pager [phone] Office [place] 10/03/2018, 9:51 AM

## 2018-10-03 NOTE — Progress Notes (Signed)
Patient ID: Amber Church, female   DOB: 03-23-1955, 63 y.o.   MRN: [id number] Patient is postoperative day 1 right total knee arthroplasty.  She has no complaints this morning.  Plan for discharge to home tomorrow.  Prescription written for Percocet and discharge summary complete.  Patient was instructed to use an aspirin daily for a month.  I will follow-up in the office in 1 week.

## 2018-10-03 NOTE — Progress Notes (Signed)
Physical Therapy Treatment Patient Details Name: Amber Church MRN: [id number] DOB: [birth year] Today's Date: 10/03/2018    History of Present Illness Pt is a 63 y.o. female s/p R TKR on 10/02/18. PMH includes anxiety, HLD, HTN, hx B achillles surgeries, obesity.   PT Comments    Pt progressing well with mobility despite c/o pain. Able to perform multiple standing transfers and hallway ambulation with RW and intermittent min guard; pt correcting gait mechanics with cues. Performed seated R knee therex. Encouraged more frequent mobility/ambulation with nursing staff throughout day. Will continue to follow acutely.   Follow Up Recommendations  Follow surgeon's recommendation for DC plan and follow-up therapies;Home health PT     Equipment Recommendations  Rolling walker with 5" wheels;3in1 (PT)    Recommendations for Other Services       Precautions / Restrictions Precautions Precautions: Fall;Knee Restrictions Weight Bearing Restrictions: Yes RLE Weight Bearing: Weight bearing as tolerated    Mobility  Bed Mobility Overal bed mobility: Needs Assistance Bed Mobility: Sit to Supine       Sit to supine: Min assist   General bed mobility comments: MinA for RLE management for return to supine  Transfers Overall transfer level: Needs assistance Equipment used: Rolling walker (2 wheeled) Transfers: Sit to/from Stand Sit to Stand: Supervision         General transfer comment: Able to stand from bed and BSC (over toilet) with RW and supervision; good hand placement  Ambulation/Gait Ambulation/Gait assistance: Min guard;Supervision Gait Distance (Feet): 160 Feet Assistive device: Rolling walker (2 wheeled) Gait Pattern/deviations: Step-through pattern;Decreased stride length;Decreased weight shift to right;Antalgic Gait velocity: Decreased Gait velocity interpretation: <1.31 ft/sec, indicative of household ambulator General Gait Details: Very slow, antalgic gait with  RW and intermittent min guard; able to increase bilateral step length and increase gait speed with cues   Stairs             Wheelchair Mobility    Modified Rankin (Stroke Patients Only)       Balance Overall balance assessment: Needs assistance   Sitting balance-Leahy Scale: Good       Standing balance-Leahy Scale: Fair                              Cognition Arousal/Alertness: Awake/alert Behavior During Therapy: WFL for tasks assessed/performed;Anxious Overall Cognitive Status: Within Functional Limits for tasks assessed                                        Exercises Total Joint Exercises Long Arc Quad: AROM;Right;Seated Knee Flexion: AAROM;Right;Seated(15-sec holds with overpressure using LLE)    General Comments        Pertinent Vitals/Pain Pain Assessment: Faces Faces Pain Scale: Hurts little more Pain Location: R knee Pain Descriptors / Indicators: Aching;Sore Pain Intervention(s): Monitored during session    Home Living                      Prior Function            PT Goals (current goals can now be found in the care plan section) Progress towards PT goals: Progressing toward goals    Frequency    7X/week      PT Plan Current plan remains appropriate    Co-evaluation  AM-PAC PT "6 Clicks" Mobility   Outcome Measure  Help needed turning from your back to your side while in a flat bed without using bedrails?: None Help needed moving from lying on your back to sitting on the side of a flat bed without using bedrails?: A Little Help needed moving to and from a bed to a chair (including a wheelchair)?: A Little Help needed standing up from a chair using your arms (e.g., wheelchair or bedside chair)?: A Little Help needed to walk in hospital room?: A Little Help needed climbing 3-5 steps with a railing? : A Little 6 Click Score: 19    End of Session Equipment Utilized During  Treatment: Gait belt Activity Tolerance: Patient tolerated treatment well Patient left: in bed;with call bell/phone within reach;with bed alarm set Nurse Communication: Mobility status PT Visit Diagnosis: Muscle weakness (generalized) (M62.81);Difficulty in walking, not elsewhere classified (R26.2);Pain Pain - Right/Left: Right Pain - part of body: Knee     Time: [id number] PT Time Calculation (min) (ACUTE ONLY): 25 min  Charges:  $Gait Training: 8-22 mins $Therapeutic Exercise: 8-22 mins                    Mabeline Caras, PT, DPT Acute Rehabilitation Services  Pager [phone] Office [place] 10/03/2018, 3:46 PM

## 2018-10-03 NOTE — TOC Transition Note (Signed)
Transition of Care [facility]) - CM/SW Discharge Note   Patient Details  Name: Amber Church MRN: [id number] Date of Birth: [birth year]  [facility] of Care [facility]) CM/SW Contact:  Claudie Leach, RN Phone Number: 10/03/2018, 5:01 PM   Clinical Narrative:    Pt to d/c home with home health PT.  [facility] accepts referral.  RW and 3n1 delivered to patient's room.    Final next level of care: [place] [address] Barriers to Discharge: No Barriers Identified    Discharge Plan and Services                DME Arranged: Walker rolling, 3-N-1 DME Agency: [employer] Date DME Agency Contacted: 10/03/18 Time DME Agency Contacted: 1215 Representative spoke with at DME Agency: [place]: PT [place]: [place] Date [place]: 10/03/18 Time [place]: 1215 Representative spoke with at [place]: Sharmon Revere

## 2018-10-03 NOTE — Progress Notes (Signed)
CSW acknowledges consult for SNF. Per OT recommendation is for [facility]. CSW alerted RN case manager of recommendation. CSW closing out consult.

## 2018-10-03 NOTE — Discharge Summary (Signed)
Discharge Diagnoses:  Active Problems:   Unilateral primary osteoarthritis, right knee   S/P TKR (total knee replacement), right   Surgeries: Procedure(s): RIGHT TOTAL KNEE ARTHROPLASTY on 10/02/2018    Consultants:   Discharged Condition: Improved  Hospital Course: LEIANI ROTHFUSS is an 63 y.o. female who was admitted 10/02/2018 with a chief complaint of osteoarthritis right knee, with a final diagnosis of Osteoarthritis Right Knee.  Patient was brought to the operating room on 10/02/2018 and underwent Procedure(s): RIGHT TOTAL KNEE ARTHROPLASTY.    Patient was given perioperative antibiotics:  Anti-infectives (From admission, onward)   Start     Dose/Rate Route Frequency Ordered Stop   10/02/18 0615  ceFAZolin (ANCEF) IVPB 2g/100 mL premix     2 g 200 mL/hr over 30 Minutes Intravenous On call to O.R. 10/02/18 [id number] 10/02/18 0741   10/02/18 [id number]  ceFAZolin (ANCEF) 2-4 GM/100ML-% IVPB    Note to Pharmacy: Cordelia Pen   : cabinet override      10/02/18 0608 10/02/18 0741    .  Patient was given sequential compression devices, early ambulation, and aspirin for DVT prophylaxis.  Recent vital signs:  Patient Vitals for the past 24 hrs:  BP Temp Temp src Pulse Resp SpO2  10/03/18 0826 98/65 98.6 F (37 C) Oral 86 18 95 %  10/03/18 0600 102/65 98.1 F (36.7 C) Oral 70 14 98 %  10/02/18 2041 97/68 97.8 F (36.6 C) Oral 68 18 98 %  10/02/18 1553 120/85 98.2 F (36.8 C) Oral 83 18 98 %  10/02/18 1522 119/78 - - 80 16 100 %  10/02/18 1107 94/83 97.6 F (36.4 C) Oral 68 17 -  10/02/18 1045 104/74 - - 69 10 96 %  10/02/18 1030 - 97.9 F (36.6 C) - 70 14 99 %  10/02/18 1015 103/74 - - - 10 -  10/02/18 1000 - - - 66 10 99 %  10/02/18 0945 100/71 - - 67 11 99 %  10/02/18 0915 99/71 (!) 97.5 F (36.4 C) - 70 10 100 %  .  Recent laboratory studies: No results found.  Discharge Medications:   Allergies as of 10/03/2018   No Known Allergies     Medication List    STOP  taking these medications   azithromycin 250 MG tablet Commonly known as: ZITHROMAX   diclofenac sodium 1 % Gel Commonly known as: VOLTAREN   ibuprofen 800 MG tablet Commonly known as: ADVIL   nabumetone 500 MG tablet Commonly known as: RELAFEN   predniSONE 10 MG tablet Commonly known as: DELTASONE   promethazine-dextromethorphan 6.25-15 MG/5ML syrup Commonly known as: PROMETHAZINE-DM   traMADol 50 MG tablet Commonly known as: ULTRAM     TAKE these medications   lisinopril-hydrochlorothiazide 10-12.5 MG tablet Commonly known as: ZESTORETIC Take 1 tablet by mouth daily.   oxyCODONE-acetaminophen 5-325 MG tablet Commonly known as: Percocet Take 1 tablet by mouth every 4 (four) hours as needed for severe pain.   pravastatin 40 MG tablet Commonly known as: PRAVACHOL Take 1 tablet (40 mg total) by mouth daily.            Discharge Care Instructions  (From admission, onward)         Start     Ordered   10/03/18 0000  Weight bearing as tolerated    Question Answer Comment  Laterality right   Extremity Lower      10/03/18 0850          Diagnostic Studies:  No results found.  Patient benefited maximally from their hospital stay and there were no complications.     Disposition: Discharge disposition: 01-Home or Self Care      Discharge Instructions    Call MD / Call 911   Complete by: As directed    If you experience chest pain or shortness of breath, CALL 911 and be transported to the hospital emergency room.  If you develope a fever above 101 F, pus (white drainage) or increased drainage or redness at the wound, or calf pain, call your surgeon's office.   Constipation Prevention   Complete by: As directed    Drink plenty of fluids.  Prune juice may be helpful.  You may use a stool softener, such as Colace (over the counter) 100 mg twice a day.  Use MiraLax (over the counter) for constipation as needed.   Diet - low sodium heart healthy   Complete by:  As directed    Elevate operative extremity   Complete by: As directed    Increase activity slowly as tolerated   Complete by: As directed    Weight bearing as tolerated   Complete by: As directed    Laterality: right   Extremity: Lower     Follow-up Information    Newt Minion, MD In 1 week.   Specialty: Orthopedic Surgery Contact information: [place] [address] [phone]            Signed: Newt Minion 10/03/2018, 8:50 AM

## 2018-10-04 NOTE — Progress Notes (Signed)
Physical Therapy Treatment Patient Details Name: Amber Church MRN: [id number] DOB: 1955/10/21 Today's Date: 10/04/2018    History of Present Illness Pt is a 63 y.o. female s/p R TKR on 10/02/18. PMH includes anxiety, HLD, HTN, hx B achillles surgeries, obesity.   PT Comments    Pt slowly progressing with mobility, increased pain this session as pt has not been out of bed yet this morning. Able to tolerate transfer, gait training and seated therex. Will plan for additional afternoon session prior to d/c home. Of note, pt has not been able to have bowel movement since admission.   Follow Up Recommendations  Follow surgeon's recommendation for DC plan and follow-up therapies;Home health PT     Equipment Recommendations  Rolling walker with 5" wheels;3in1 (PT)(delivered to room)    Recommendations for Other Services       Precautions / Restrictions Precautions Precautions: Fall;Knee Restrictions Weight Bearing Restrictions: Yes RLE Weight Bearing: Weight bearing as tolerated    Mobility  Bed Mobility Overal bed mobility: Needs Assistance Bed Mobility: Supine to Sit     Supine to sit: Min assist     General bed mobility comments: Practiced with bed flat, reliant on minA to manage RLE  Transfers Overall transfer level: Needs assistance Equipment used: Rolling walker (2 wheeled) Transfers: Sit to/from Stand Sit to Stand: Supervision         General transfer comment: Able to stand from bed and BSC (over toilet) with RW and supervision; good hand placement, limited by increased pain this session  Ambulation/Gait Ambulation/Gait assistance: Min guard Gait Distance (Feet): 60 Feet Assistive device: Rolling walker (2 wheeled) Gait Pattern/deviations: Step-through pattern;Decreased stride length;Decreased weight shift to right;Antalgic Gait velocity: decreased Gait velocity interpretation: <1.31 ft/sec, indicative of household ambulator General Gait Details: Very  slow, antalgic gait with RW and intermittent min guard; pt with increased pain and return to poor gait mechanics requiring cues for increased bilateral step length and RLE heel-to-toe gait pattern, able to improve minimalyl   Stairs             Wheelchair Mobility    Modified Rankin (Stroke Patients Only)       Balance Overall balance assessment: Needs assistance   Sitting balance-Leahy Scale: Fair       Standing balance-Leahy Scale: Fair                              Cognition Arousal/Alertness: Awake/alert Behavior During Therapy: WFL for tasks assessed/performed Overall Cognitive Status: Within Functional Limits for tasks assessed                                        Exercises Total Joint Exercises Long Arc Quad: AROM;Right;Seated Knee Flexion: AAROM;Right;Seated(15-30 sec holds)    General Comments        Pertinent Vitals/Pain Pain Assessment: Faces Faces Pain Scale: Hurts even more Pain Location: R knee Pain Descriptors / Indicators: Aching;Sore Pain Intervention(s): Premedicated before session    Home Living                      Prior Function            PT Goals (current goals can now be found in the care plan section) Progress towards PT goals: Progressing toward goals    Frequency    7X/week  PT Plan Current plan remains appropriate    Co-evaluation              AM-PAC PT "6 Clicks" Mobility   Outcome Measure  Help needed turning from your back to your side while in a flat bed without using bedrails?: None Help needed moving from lying on your back to sitting on the side of a flat bed without using bedrails?: A Little Help needed moving to and from a bed to a chair (including a wheelchair)?: A Little Help needed standing up from a chair using your arms (e.g., wheelchair or bedside chair)?: A Little Help needed to walk in hospital room?: A Little Help needed climbing 3-5 steps with  a railing? : A Little 6 Click Score: 19    End of Session Equipment Utilized During Treatment: Gait belt Activity Tolerance: Patient limited by pain Patient left: with call bell/phone within reach;in chair Nurse Communication: Mobility status PT Visit Diagnosis: Muscle weakness (generalized) (M62.81);Difficulty in walking, not elsewhere classified (R26.2);Pain Pain - Right/Left: Right Pain - part of body: Knee     Time: [id number] PT Time Calculation (min) (ACUTE ONLY): 28 min  Charges:  $Gait Training: 8-22 mins $Therapeutic Activity: 8-22 mins                     Mabeline Caras, PT, DPT Acute Rehabilitation Services  Pager [phone] Office [place] 10/04/2018, 10:17 AM

## 2018-10-04 NOTE — Plan of Care (Signed)
  Problem: Education: Goal: Knowledge of General Education information will improve Description: Including pain rating scale, medication(s)/side effects and non-pharmacologic comfort measures Outcome: Progressing   Problem: Education: Goal: Knowledge of General Education information will improve Description: Including pain rating scale, medication(s)/side effects and non-pharmacologic comfort measures Outcome: Progressing   Problem: Clinical Measurements: Goal: Ability to maintain clinical measurements within normal limits will improve Outcome: Progressing   Problem: Nutrition: Goal: Adequate nutrition will be maintained Outcome: Progressing   Problem: Coping: Goal: Level of anxiety will decrease Outcome: Progressing   Problem: Elimination: Goal: Will not experience complications related to bowel motility Outcome: Progressing   Problem: Pain Managment: Goal: General experience of comfort will improve Outcome: Progressing   Problem: Safety: Goal: Ability to remain free from injury will improve Outcome: Progressing

## 2018-10-04 NOTE — Progress Notes (Signed)
Provided discharge education/instructions, all questions and concerns addressed, Pt not in distress, discharged home with belongings and DME.

## 2018-10-04 NOTE — Progress Notes (Addendum)
Physical Therapy Treatment Patient Details Name: Amber Church MRN: [id number] DOB: 14-Dec-1955 Today's Date: 10/04/2018    History of Present Illness Pt is a 63 y.o. female s/p R TKR on 10/02/18. PMH includes anxiety, HLD, HTN, hx B achillles surgeries, obesity.   PT Comments    Pt progressing well with mobility. Preparing for d/c home this afternoon. Pt has no further questions or concerns. RN notified pt ready for d/c from PT's perspective.   Follow Up Recommendations  Follow surgeon's recommendation for DC plan and follow-up therapies;Home health PT     Equipment Recommendations  Rolling walker with 5" wheels;3in1 (PT)(already delivered to room)    Recommendations for Other Services       Precautions / Restrictions Precautions Precautions: Fall;Knee Precaution Comments: Reviewed precautions and positioning Restrictions Weight Bearing Restrictions: Yes RLE Weight Bearing: Weight bearing as tolerated    Mobility  Bed Mobility Overal bed mobility: Needs Assistance Bed Mobility: Supine to Sit;Sit to Supine     Supine to sit: Min assist Sit to supine: Min assist   General bed mobility comments: MinA for RLE management; reports husband will be able to assist with this. Pt able to reposition in bed well  Transfers Overall transfer level: Needs assistance Equipment used: Rolling walker (2 wheeled) Transfers: Sit to/from Stand Sit to Stand: Supervision         General transfer comment: Able to stand from bed and low bench with RW and supervision; good hand placement  Ambulation/Gait Ambulation/Gait assistance: Supervision Gait Distance (Feet): 100 Feet Assistive device: Rolling walker (2 wheeled) Gait Pattern/deviations: Step-through pattern;Decreased stride length;Decreased weight shift to right;Antalgic Gait velocity: Decreased Gait velocity interpretation: <1.31 ft/sec, indicative of household ambulator General Gait Details: Slow, antalgic gait with RW at  supervision-level; pt able to increase bilateral step length and RLE heel-to-toe gait pattern with cues. Intermittent standing rest breaks due to BUE fatigue. 1x seated rest in hallway to complete therex   Stairs             Wheelchair Mobility    Modified Rankin (Stroke Patients Only)       Balance Overall balance assessment: Needs assistance   Sitting balance-Leahy Scale: Fair       Standing balance-Leahy Scale: Fair                              Cognition Arousal/Alertness: Awake/alert Behavior During Therapy: WFL for tasks assessed/performed Overall Cognitive Status: Within Functional Limits for tasks assessed                                        Exercises Total Joint Exercises Long Arc Quad: AROM;Right;Seated Knee Flexion: AAROM;Right;Seated(15-30 sec holds) General Exercises - Lower Extremity Long Arc Quad: AROM;Right;Seated Hip Flexion/Marching: AROM;Both;Seated Toe Raises: AROM;Both;Seated Heel Raises: AROM;Both;Seated    General Comments        Pertinent Vitals/Pain Pain Assessment: Faces Faces Pain Scale: Hurts little more Pain Location: R knee Pain Descriptors / Indicators: Aching;Sore Pain Intervention(s): Monitored during session    Home Living                      Prior Function            PT Goals (current goals can now be found in the care plan section) Progress towards PT goals: Progressing toward goals  Frequency    7X/week      PT Plan Current plan remains appropriate    Co-evaluation              AM-PAC PT "6 Clicks" Mobility   Outcome Measure  Help needed turning from your back to your side while in a flat bed without using bedrails?: None Help needed moving from lying on your back to sitting on the side of a flat bed without using bedrails?: A Little Help needed moving to and from a bed to a chair (including a wheelchair)?: A Little Help needed standing up from a  chair using your arms (e.g., wheelchair or bedside chair)?: A Little Help needed to walk in hospital room?: A Little Help needed climbing 3-5 steps with a railing? : A Little 6 Click Score: 19    End of Session Equipment Utilized During Treatment: Gait belt Activity Tolerance: Patient tolerated treatment well Patient left: in bed;with call bell/phone within reach Nurse Communication: Mobility status PT Visit Diagnosis: Muscle weakness (generalized) (M62.81);Difficulty in walking, not elsewhere classified (R26.2);Pain Pain - Right/Left: Right Pain - part of body: Knee     Time: 1306-1330 PT Time Calculation (min) (ACUTE ONLY): 24 min  Charges:  $Gait Training: 8-22 mins $Therapeutic Exercise: 8-22 mins                   Mabeline Caras, PT, DPT Acute Rehabilitation Services  Pager [phone] Office [place] 10/04/2018, 1:47 PM

## 2018-10-04 NOTE — Discharge Instructions (Signed)

## 2018-10-04 NOTE — Progress Notes (Signed)
     Subjective: 2 Days Post-Op Procedure(s) (LRB): RIGHT TOTAL KNEE ARTHROPLASTY (Right) Awake, alert and oriented x 4. I'm ready to go. Pain is controlled with narcotics and ice packs. 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  Patient reports pain as moderate.    Objective:   VITALS:  Temp:  [98.6 F (37 C)] 98.6 F (37 C) (09/27 0800) Pulse Rate:  [93] 93 (09/27 0800) Resp:  [16] 16 (09/27 0800) BP: (119)/(89) 119/89 (09/27 0800) SpO2:  [98 %] 98 % (09/27 0800)  Neurologically intact ABD soft Neurovascular intact Sensation intact distally Intact pulses distally Dorsiflexion/Plantar flexion intact Incision: dressing C/D/I and no drainage No cellulitis present Compartment soft   LABS No results for input(s): HGB, WBC, PLT in the last 72 hours. No results for input(s): NA, K, CL, CO2, BUN, CREATININE, GLUCOSE in the last 72 hours. No results for input(s): LABPT, INR in the last 72 hours.   Assessment/Plan: 2 Days Post-Op Procedure(s) (LRB): RIGHT TOTAL KNEE ARTHROPLASTY (Right)  Advance diet Up with therapy Discharge home with home health  [name] 10/04/2018, 1:53 PM Patient ID: Amber Church, female   DOB: Nov 17, 1955, 63 y.o.   MRN: [id number]

## 2018-10-05 ENCOUNTER — Encounter (HOSPITAL_COMMUNITY): Payer: Self-pay | Admitting: Orthopedic Surgery

## 2018-10-12 ENCOUNTER — Ambulatory Visit (INDEPENDENT_AMBULATORY_CARE_PROVIDER_SITE_OTHER): Payer: 59 | Admitting: Orthopedic Surgery

## 2018-10-12 ENCOUNTER — Encounter: Payer: Self-pay | Admitting: Orthopedic Surgery

## 2018-10-12 VITALS — Ht 64.0 in | Wt 210.1 lb

## 2018-10-12 DIAGNOSIS — Z96651 Presence of right artificial knee joint: Secondary | ICD-10-CM

## 2018-10-12 MED ORDER — OXYCODONE-ACETAMINOPHEN 5-325 MG PO TABS: 1.0000 | ORAL_TABLET | ORAL | 0 refills | Status: DC | PRN

## 2018-10-12 NOTE — Progress Notes (Signed)
Office Visit Note   Patient: Amber Church           Date of Birth: 03/18/1955           MRN: [id number] Visit Date: 10/12/2018              Requested by: Denita Lung, MD [address] [place],   [address] PCP: Denita Lung, MD  Chief Complaint  Patient presents with  . Right Knee - Routine Post Op    10/02/2018 right TKA      HPI: Patient is a 63 year old woman who presents 1 week status post right total knee arthroplasty.  Patient states she has started home health physical therapy she states her pain is a 7-8 over 10 she complains of venous stasis swelling.  Assessment & Plan: Visit Diagnoses:  1. Total knee replacement status, right     Plan: Prescription called in for pain medication recommended a knee-high medical compression stocking her calf is 43 cm in circumference.  Recommended working on knee extension.  Harvest staples at follow-up.  Follow-Up Instructions: Return in about 1 week (around 10/19/2018).   Ortho Exam  Patient is alert, oriented, no adenopathy, well-dressed, normal affect, normal respiratory effort. Examination patient has venous stasis swelling no pain with range of motion of the ankle no evidence of DVT.  She lacks about 5 degrees to full extension the incision is well-healed with no dehiscence no cellulitis.  Imaging: No results found. No images are attached to the encounter.  Labs: No results found for: HGBA1C, ESRSEDRATE, CRP, LABURIC, REPTSTATUS, GRAMSTAIN, CULT, LABORGA   Lab Results  Component Value Date   ALBUMIN 3.8 09/25/2018   ALBUMIN 4.3 08/18/2018   ALBUMIN 4.1 07/16/2018    No results found for: MG No results found for: VD25OH  No results found for: PREALBUMIN CBC EXTENDED Latest Ref Rng & Units 09/25/2018 07/16/2018 06/03/2017  WBC 4.0 - 10.5 K/uL 9.5 9.1 7.5  RBC 3.87 - 5.11 MIL/uL 4.15 4.28 4.47  HGB 12.0 - 15.0 g/dL 12.9 12.8 13.6  HCT 36.0 - 46.0 % 38.4 38.2 38.9  PLT 150 - 400 K/uL 345 325  336  NEUTROABS 1.4 - 7.0 x10E3/uL - 5.9 4.0  LYMPHSABS 0.7 - 3.1 x10E3/uL - 2.1 2.6     Body mass index is 36.06 kg/m.  Orders:  No orders of the defined types were placed in this encounter.  Meds ordered this encounter  Medications  . oxyCODONE-acetaminophen (PERCOCET) 5-325 MG tablet    Sig: Take 1 tablet by mouth every 4 (four) hours as needed for severe pain.    Dispense:  30 tablet    Refill:  0     Procedures: No procedures performed  Clinical Data: No additional findings.  ROS:  All other systems negative, except as noted in the HPI. Review of Systems  Objective: Vital Signs: Ht 5\' 4"  (1.626 m)   Wt 210 lb 1.6 oz (95.3 kg)   BMI 36.06 kg/m   Specialty Comments:  No specialty comments available.  PMFS History: Patient Active Problem List   Diagnosis Date Noted  . S/P TKR (total knee replacement), right 10/02/2018  . Unilateral primary osteoarthritis, right knee   . Achilles tendonitis, bilateral 05/13/2016  . Achilles tendon contracture, bilateral 05/13/2016  . Hypertension 08/06/2010  . Hyperlipidemia LDL goal <100 08/06/2010  . Obesity (BMI 30-39.9) 08/06/2010   Past Medical History:  Diagnosis Date  . Abnormal EKG   . Abnormal pap  s/p cryotherapy  . Anxiety   . Arthritis   . Depression   . Diverticulosis   . GERD (gastroesophageal reflux disease)   . Hyperlipidemia   . Hypertension   . S/P colonoscopic polypectomy     Family History  Problem Relation Age of Onset  . Alzheimer's disease Mother   . Hyperlipidemia Mother   . Other Father        died of gunshot wound  . Osteoporosis Sister   . Osteoporosis Sister   . ALS Maternal Aunt   . ALS Maternal Uncle   . Diabetes Paternal Aunt   . Cancer Maternal Grandfather        lung cancer  . Cancer Paternal Grandmother        ?type  . Heart disease Neg Hx     Past Surgical History:  Procedure Laterality Date  . COLONOSCOPY  03/18/11; 2000   Dr. Amedeo Plenty; diverticulosis in sigmoid  colon  . crysurgery  age 55   for abnormal pap  . LAMINECTOMY  03/2002  . TOTAL KNEE ARTHROPLASTY Right 10/02/2018  . TOTAL KNEE ARTHROPLASTY Right 10/02/2018   Procedure: RIGHT TOTAL KNEE ARTHROPLASTY;  Surgeon: Newt Minion, MD;  Location: [facility];  Service: Orthopedics;  Laterality: Right;   Social History   Occupational History  . Occupation: quality control    Employer: [employer]  Tobacco Use  . Smoking status: Never Smoker  . Smokeless tobacco: Never Used  Substance and Sexual Activity  . Alcohol use: Yes    Comment: glass of wine 1-2 times per week.  . Drug use: No  . Sexual activity: Yes    Partners: Male    Comment: postmenopausal

## 2018-10-14 ENCOUNTER — Telehealth: Payer: Self-pay | Admitting: Physical Medicine and Rehabilitation

## 2018-10-14 NOTE — Telephone Encounter (Signed)
Is auth needed for [id number]? Scheduled for 11/5.

## 2018-10-14 NOTE — Telephone Encounter (Signed)
Sure, but if not hurting then would not do, they are not preventative.

## 2018-10-15 NOTE — Telephone Encounter (Signed)
[address] Injection(s), anesthetic agent and/or st more  Notification/Prior Authorization not required if procedure performed in Office; otherwise may be required for this service.

## 2018-10-19 ENCOUNTER — Ambulatory Visit (INDEPENDENT_AMBULATORY_CARE_PROVIDER_SITE_OTHER): Payer: No Typology Code available for payment source | Admitting: Orthopedic Surgery

## 2018-10-19 ENCOUNTER — Other Ambulatory Visit: Payer: Self-pay

## 2018-10-19 ENCOUNTER — Encounter: Payer: Self-pay | Admitting: Orthopedic Surgery

## 2018-10-19 ENCOUNTER — Ambulatory Visit: Payer: No Typology Code available for payment source | Admitting: Family Medicine

## 2018-10-19 ENCOUNTER — Encounter: Payer: Self-pay | Admitting: Family Medicine

## 2018-10-19 VITALS — BP 110/78 | HR 87 | Temp 97.5°F | Wt 208.6 lb

## 2018-10-19 VITALS — Ht 64.0 in | Wt 208.6 lb

## 2018-10-19 DIAGNOSIS — Z96651 Presence of right artificial knee joint: Secondary | ICD-10-CM

## 2018-10-19 DIAGNOSIS — F4329 Adjustment disorder with other symptoms: Secondary | ICD-10-CM | POA: Diagnosis not present

## 2018-10-19 NOTE — Progress Notes (Signed)
Office Visit Note   Patient: Amber Church           Date of Birth: 06-30-1955           MRN: [id number] Visit Date: 10/19/2018              Requested by: Denita Lung, MD [address] [place],  [place] [address] PCP: Denita Lung, MD  Chief Complaint  Patient presents with  . Right Knee - Routine Post Op    10/02/2018 right TKA      HPI: Patient is a 63 year old woman who presents 2 weeks status post right total knee arthroplasty she feels like she is making good progress currently using a walker.  Assessment & Plan: Visit Diagnoses:  1. Total knee replacement status, right     Plan: Patient was given instructions to continue working on knee extension recommended a compression sock to be continually worn recommended scar massage continue with therapy.  Follow-Up Instructions: Return in about 2 weeks (around 11/02/2018).   Ortho Exam  Patient is alert, oriented, no adenopathy, well-dressed, normal affect, normal respiratory effort. Examination patient lacks 5 degrees to full extension her incision is healed well staples are harvested discussed the importance of starting scar massage.  Imaging: No results found. No images are attached to the encounter.  Labs: No results found for: HGBA1C, ESRSEDRATE, CRP, LABURIC, REPTSTATUS, GRAMSTAIN, CULT, LABORGA   Lab Results  Component Value Date   ALBUMIN 3.8 09/25/2018   ALBUMIN 4.3 08/18/2018   ALBUMIN 4.1 07/16/2018    No results found for: MG No results found for: VD25OH  No results found for: PREALBUMIN CBC EXTENDED Latest Ref Rng & Units 09/25/2018 07/16/2018 06/03/2017  WBC 4.0 - 10.5 K/uL 9.5 9.1 7.5  RBC 3.87 - 5.11 MIL/uL 4.15 4.28 4.47  HGB 12.0 - 15.0 g/dL 12.9 12.8 13.6  HCT 36.0 - 46.0 % 38.4 38.2 38.9  PLT 150 - 400 K/uL 345 325 336  NEUTROABS 1.4 - 7.0 x10E3/uL - 5.9 4.0  LYMPHSABS 0.7 - 3.1 x10E3/uL - 2.1 2.6     Body mass index is 35.81 kg/m.  Orders:  No orders of the defined  types were placed in this encounter.  No orders of the defined types were placed in this encounter.    Procedures: No procedures performed  Clinical Data: No additional findings.  ROS:  All other systems negative, except as noted in the HPI. Review of Systems  Objective: Vital Signs: Ht 5\' 4"  (1.626 m)   Wt 208 lb 9.6 oz (94.6 kg)   BMI 35.81 kg/m   Specialty Comments:  No specialty comments available.  PMFS History: Patient Active Problem List   Diagnosis Date Noted  . S/P TKR (total knee replacement), right 10/02/2018  . Unilateral primary osteoarthritis, right knee   . Achilles tendonitis, bilateral 05/13/2016  . Achilles tendon contracture, bilateral 05/13/2016  . Hypertension 08/06/2010  . Hyperlipidemia LDL goal <100 08/06/2010  . Obesity (BMI 30-39.9) 08/06/2010   Past Medical History:  Diagnosis Date  . Abnormal EKG   . Abnormal pap    s/p cryotherapy  . Anxiety   . Arthritis   . Depression   . Diverticulosis   . GERD (gastroesophageal reflux disease)   . Hyperlipidemia   . Hypertension   . S/P colonoscopic polypectomy     Family History  Problem Relation Age of Onset  . Alzheimer's disease Mother   . Hyperlipidemia Mother   . Other Father  died of gunshot wound  . Osteoporosis Sister   . Osteoporosis Sister   . ALS Maternal Aunt   . ALS Maternal Uncle   . Diabetes Paternal Aunt   . Cancer Maternal Grandfather        lung cancer  . Cancer Paternal Grandmother        ?type  . Heart disease Neg Hx     Past Surgical History:  Procedure Laterality Date  . COLONOSCOPY  03/18/11; 2000   Dr. Amedeo Plenty; diverticulosis in sigmoid colon  . crysurgery  age 23   for abnormal pap  . LAMINECTOMY  03/2002  . TOTAL KNEE ARTHROPLASTY Right 10/02/2018  . TOTAL KNEE ARTHROPLASTY Right 10/02/2018   Procedure: RIGHT TOTAL KNEE ARTHROPLASTY;  Surgeon: Newt Minion, MD;  Location: [facility];  Service: Orthopedics;  Laterality: Right;   Social History    Occupational History  . Occupation: quality control    Employer: [employer]  Tobacco Use  . Smoking status: Never Smoker  . Smokeless tobacco: Never Used  Substance and Sexual Activity  . Alcohol use: Yes    Comment: glass of wine 1-2 times per week.  . Drug use: No  . Sexual activity: Yes    Partners: Male    Comment: postmenopausal
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
## 2018-11-03 ENCOUNTER — Ambulatory Visit: Payer: No Typology Code available for payment source | Admitting: Orthopedic Surgery

## 2018-11-09 ENCOUNTER — Ambulatory Visit (INDEPENDENT_AMBULATORY_CARE_PROVIDER_SITE_OTHER): Payer: No Typology Code available for payment source | Admitting: Orthopedic Surgery

## 2018-11-09 ENCOUNTER — Encounter: Payer: Self-pay | Admitting: Orthopedic Surgery

## 2018-11-09 ENCOUNTER — Other Ambulatory Visit: Payer: Self-pay

## 2018-11-09 VITALS — Ht 64.0 in | Wt 208.0 lb

## 2018-11-09 DIAGNOSIS — M1711 Unilateral primary osteoarthritis, right knee: Secondary | ICD-10-CM

## 2018-11-09 MED ORDER — OXYCODONE-ACETAMINOPHEN 5-325 MG PO TABS: 1.0000 | ORAL_TABLET | Freq: Three times a day (TID) | ORAL | 0 refills | Status: DC | PRN

## 2018-11-09 NOTE — Progress Notes (Signed)
Office Visit Note   Patient: Amber Church           Date of Birth: [birth year]           MRN: [id number] Visit Date: 11/09/2018              Requested by: Denita Lung, MD [place],  [place] [address] PCP: Denita Lung, MD   Assessment & Plan: Visit Diagnoses: No diagnosis found. ^ weeks s/p Total knee replacement. Continue PT with emphasis on extension. Oxycodone refilled.  Should be last refill Plan: 4 weeks  Follow-Up Instructions: No follow-ups on file.   Orders:  No orders of the defined types were placed in this encounter.  Meds ordered this encounter  Medications  . oxyCODONE-acetaminophen (PERCOCET) 5-325 MG tablet    Sig: Take 1 tablet by mouth every 8 (eight) hours as needed for severe pain.    Dispense:  30 tablet    Refill:  0      Procedures: No procedures performed   Clinical Data: No additional findings.   Subjective: Chief Complaint  Patient presents with  . Right Knee - Routine Post Op    10/02/18 right total knee arthroplasty     HPI 6 weeks s/p Right Knee Arthoplasty  Review of Systems   Objective: Vital Signs: Ht 5\' 4"  (1.626 m)   Wt 208 lb (94.3 kg)   BMI 35.70 kg/m   Physical Exam  Ortho Exam Swelling well controlled distal CMS intact. Extension lacking few degrees of full extension. Compartments soft non tender  Specialty Comments:  No specialty comments available.  Imaging: No results found.   PMFS History: Patient Active Problem List   Diagnosis Date Noted  . S/P TKR (total knee replacement), right 10/02/2018  . Unilateral primary osteoarthritis, right knee   . Achilles tendonitis, bilateral 05/13/2016  . Achilles tendon contracture, bilateral 05/13/2016  . Hypertension 08/06/2010  . Hyperlipidemia LDL goal <100 08/06/2010  . Obesity (BMI 30-39.9) 08/06/2010   Past Medical History:  Diagnosis Date  . Abnormal EKG   . Abnormal pap    s/p cryotherapy  . Anxiety   . Arthritis   .  Depression   . Diverticulosis   . GERD (gastroesophageal reflux disease)   . Hyperlipidemia   . Hypertension   . S/P colonoscopic polypectomy     Family History  Problem Relation Age of Onset  . Alzheimer's disease Mother   . Hyperlipidemia Mother   . Other Father        died of gunshot wound  . Osteoporosis Sister   . Osteoporosis Sister   . ALS Maternal Aunt   . ALS Maternal Uncle   . Diabetes Paternal Aunt   . Cancer Maternal Grandfather        lung cancer  . Cancer Paternal Grandmother        ?type  . Heart disease Neg Hx     Past Surgical History:  Procedure Laterality Date  . COLONOSCOPY  03/18/11; 2000   Dr. Amedeo Plenty; diverticulosis in sigmoid colon  . crysurgery  age 63   for abnormal pap  . LAMINECTOMY  03/2002  . TOTAL KNEE ARTHROPLASTY Right 10/02/2018  . TOTAL KNEE ARTHROPLASTY Right 10/02/2018   Procedure: RIGHT TOTAL KNEE ARTHROPLASTY;  Surgeon: Newt Minion, MD;  Location: [facility];  Service: Orthopedics;  Laterality: Right;   Social History   Occupational History  . Occupation: Fish farm manager: [employer]  Tobacco Use  . Smoking status: Never Smoker  . Smokeless tobacco: Never Used  Substance and Sexual Activity  . Alcohol use: Yes    Comment: glass of wine 1-2 times per week.  . Drug use: No  . Sexual activity: Yes    Partners: Male    Comment: postmenopausal

## 2018-11-12 ENCOUNTER — Ambulatory Visit (INDEPENDENT_AMBULATORY_CARE_PROVIDER_SITE_OTHER): Payer: No Typology Code available for payment source | Admitting: Physical Medicine and Rehabilitation

## 2018-11-12 ENCOUNTER — Encounter: Payer: Self-pay | Admitting: Physical Medicine and Rehabilitation

## 2018-11-12 ENCOUNTER — Other Ambulatory Visit: Payer: Self-pay

## 2018-11-12 ENCOUNTER — Ambulatory Visit: Payer: Self-pay

## 2018-11-12 VITALS — BP 119/90 | HR 100

## 2018-11-12 DIAGNOSIS — M5416 Radiculopathy, lumbar region: Secondary | ICD-10-CM | POA: Diagnosis not present

## 2018-11-12 DIAGNOSIS — M48062 Spinal stenosis, lumbar region with neurogenic claudication: Secondary | ICD-10-CM | POA: Diagnosis not present

## 2018-11-12 MED ORDER — BETAMETHASONE SOD PHOS & ACET 6 (3-3) MG/ML IJ SUSP
12.0000 mg | Freq: Once | INTRAMUSCULAR | Status: AC
  Administered 2018-11-12: 12 mg

## 2018-11-12 NOTE — Procedures (Signed)
Lumbosacral Transforaminal Epidural Steroid Injection - Sub-Pedicular Approach with Fluoroscopic Guidance  [name]      Date of Birth: Mar 24, 1955 MRN: [id number] PCP: Denita Lung, MD      Visit Date: 11/12/2018   Universal Protocol:    Date/Time: 11/12/2018  Consent Given By: the patient  Position: PRONE  Additional Comments: Vital signs were monitored before and after the procedure. Patient was prepped and draped in the usual sterile fashion. The correct patient, procedure, and site was verified.   Injection Procedure Details:  Procedure Site One Meds Administered:  Meds ordered this encounter  Medications  . betamethasone acetate-betamethasone sodium phosphate (CELESTONE) injection 12 mg    Laterality: Bilateral  Location/Site:  L4-L5  Needle size: 22 G  Needle type: Spinal  Needle Placement: Transforaminal  Findings:    -Comments: Excellent flow of contrast along the nerve and into the epidural space.  Procedure Details: After squaring off the end-plates to get a true AP view, the C-arm was positioned so that an oblique view of the foramen as noted above was visualized. The target area is just inferior to the "nose of the scotty dog" or sub pedicular. The soft tissues overlying this structure were infiltrated with 2-3 ml. of 1% Lidocaine without Epinephrine.  The spinal needle was inserted toward the target using a "trajectory" view along the fluoroscope beam.  Under AP and lateral visualization, the needle was advanced so it did not puncture dura and was located close the 6 O'Clock position of the pedical in AP tracterory. Biplanar projections were used to confirm position. Aspiration was confirmed to be negative for CSF and/or blood. A 1-2 ml. volume of Isovue-250 was injected and flow of contrast was noted at each level. Radiographs were obtained for documentation purposes.   After attaining the desired flow of contrast documented above, a  0.5 to 1.0 ml test dose of 0.25% Marcaine was injected into each respective transforaminal space.  The patient was observed for 90 seconds post injection.  After no sensory deficits were reported, and normal lower extremity motor function was noted,   the above injectate was administered so that equal amounts of the injectate were placed at each foramen (level) into the transforaminal epidural space.   Additional Comments:  The patient tolerated the procedure well.  Patient very sensitive to the needling or puncture the skin just very sensitive overall. Dressing: 2 x 2 sterile gauze and Band-Aid    Post-procedure details: Patient was observed during the procedure. Post-procedure instructions were reviewed.  Patient left the clinic in stable condition.

## 2018-11-12 NOTE — Progress Notes (Signed)
.  Numeric Pain Rating Scale and Functional Assessment Average Pain 7   In the last MONTH (on 0-10 scale) has pain interfered with the following?  1. General activity like being  able to carry out your everyday physical activities such as walking, climbing stairs, carrying groceries, or moving a chair?  Rating(7)   +Driver, -BT, -Dye Allergies.   

## 2018-11-12 NOTE — Progress Notes (Signed)
Amber Church - 63 y.o. female MRN [id number]  Date of birth: Jul 15, 1955  Office Visit Note: Visit Date: 11/12/2018 PCP: Denita Lung, MD Referred by: Denita Lung, MD  Subjective: Chief Complaint  Patient presents with  . Lower Back - Pain   HPI:  Amber Church is a 63 y.o. female who comes in today At the request of Dr. Meridee Score for repeat epidural injection.  Patient has moderate multifactorial stenosis at L4-5 with facet arthropathy and mostly low back and now more bilateral pain still more right than left but overall bilateral.  Last injection helped a great deal for her.  First 2 epidural injections were performed from an interlaminar approach and just did not seem to help that much.  They helped but were very short-lived.  This injection did seem to last a few months.  Since have seen her she has had right total knee replacement by Dr. Sharol Given.  We are going to try bilateral L4 transforaminal injection today.  As of note the patient is very sensitive to any movement with needling and in the future would look at using Valium preprocedure.  No underlying diagnosis of fibromyalgia that we know of.  ROS Otherwise per HPI.  Assessment & Plan: Visit Diagnoses:  1. Lumbar radiculopathy   2. Spinal stenosis of lumbar region with neurogenic claudication     Plan: No additional findings.   Meds & Orders:  Meds ordered this encounter  Medications  . betamethasone acetate-betamethasone sodium phosphate (CELESTONE) injection 12 mg    Orders Placed This Encounter  Procedures  . XR C-ARM NO REPORT  . Epidural Steroid injection    Follow-up: Return if symptoms worsen or fail to improve.   Procedures: No procedures performed  Lumbosacral Transforaminal Epidural Steroid Injection - Sub-Pedicular Approach with Fluoroscopic Guidance  [name]      Date of Birth: [birth year] MRN: [id number] PCP: Denita Lung, MD      Visit Date: 11/12/2018    Universal Protocol:    Date/Time: 11/12/2018  Consent Given By: the patient  Position: PRONE  Additional Comments: Vital signs were monitored before and after the procedure. Patient was prepped and draped in the usual sterile fashion. The correct patient, procedure, and site was verified.   Injection Procedure Details:  Procedure Site One Meds Administered:  Meds ordered this encounter  Medications  . betamethasone acetate-betamethasone sodium phosphate (CELESTONE) injection 12 mg    Laterality: Bilateral  Location/Site:  L4-L5  Needle size: 22 G  Needle type: Spinal  Needle Placement: Transforaminal  Findings:    -Comments: Excellent flow of contrast along the nerve and into the epidural space.  Procedure Details: After squaring off the end-plates to get a true AP view, the C-arm was positioned so that an oblique view of the foramen as noted above was visualized. The target area is just inferior to the "nose of the scotty dog" or sub pedicular. The soft tissues overlying this structure were infiltrated with 2-3 ml. of 1% Lidocaine without Epinephrine.  The spinal needle was inserted toward the target using a "trajectory" view along the fluoroscope beam.  Under AP and lateral visualization, the needle was advanced so it did not puncture dura and was located close the 6 O'Clock position of the pedical in AP tracterory. Biplanar projections were used to confirm position. Aspiration was confirmed to be negative for CSF and/or blood. A 1-2 ml. volume of Isovue-250 was injected and flow of contrast was  noted at each level. Radiographs were obtained for documentation purposes.   After attaining the desired flow of contrast documented above, a 0.5 to 1.0 ml test dose of 0.25% Marcaine was injected into each respective transforaminal space.  The patient was observed for 90 seconds post injection.  After no sensory deficits were reported, and normal lower extremity motor function was  noted,   the above injectate was administered so that equal amounts of the injectate were placed at each foramen (level) into the transforaminal epidural space.   Additional Comments:  The patient tolerated the procedure well.  Patient very sensitive to the needling or puncture the skin just very sensitive overall. Dressing: 2 x 2 sterile gauze and Band-Aid    Post-procedure details: Patient was observed during the procedure. Post-procedure instructions were reviewed.  Patient left the clinic in stable condition.     Clinical History: MRI LUMBAR SPINE WITHOUT CONTRAST  TECHNIQUE: Multiplanar, multisequence MR imaging of the lumbar spine was performed. No intravenous contrast was administered.  COMPARISON:  None.  FINDINGS: Segmentation:  Standard.  Alignment:  Minimal grade 1 anterolisthesis of  Vertebrae:  No fracture, evidence of discitis, or bone lesion.  Conus medullaris and cauda equina: Conus extends to the T12 level. Conus and cauda equina appear normal.  Paraspinal and other soft tissues: No acute paraspinal abnormality.  Disc levels:  Disc spaces: Degenerative disc disease with disc height loss at L4-5 and L5-S1.  T12-L1: No significant disc bulge. No evidence of neural foraminal stenosis. No central canal stenosis. Mild bilateral facet arthropathy.  L1-L2: No significant disc bulge. No evidence of neural foraminal stenosis. No central canal stenosis.  L2-L3: Mild broad-based disc bulge. Mild bilateral facet arthropathy. No evidence of neural foraminal stenosis. No central canal stenosis.  L3-L4: Mild broad-based disc bulge. Moderate bilateral facet arthropathy. No evidence of neural foraminal stenosis. Mild spinal stenosis. Bilateral lateral recess stenosis.  L4-L5: Broad-based disc bulge. Moderate left and mild right facet arthropathy. Moderate spinal stenosis and bilateral lateral recess stenosis. No evidence of neural foraminal  stenosis.  L5-S1: No significant disc bulge. No evidence of neural foraminal stenosis. No central canal stenosis. Mild bilateral facet arthropathy.  IMPRESSION: 1. Lumbar spine spondylosis as described.   Electronically Signed   By: Kathreen Devoid   On: 03/30/2018 11:12     Objective:  VS:  HT:    WT:   BMI:     BP:119/90  HR:100bpm  TEMP: ( )  RESP:  Physical Exam  Ortho Exam Imaging: Xr C-arm No Report  Result Date: 11/12/2018 Please see Notes tab for imaging impression.

## 2018-12-07 ENCOUNTER — Encounter: Payer: Self-pay | Admitting: Orthopedic Surgery

## 2018-12-07 ENCOUNTER — Ambulatory Visit (INDEPENDENT_AMBULATORY_CARE_PROVIDER_SITE_OTHER): Payer: No Typology Code available for payment source | Admitting: Orthopedic Surgery

## 2018-12-07 ENCOUNTER — Other Ambulatory Visit: Payer: Self-pay

## 2018-12-07 VITALS — Ht 64.0 in | Wt 208.0 lb

## 2018-12-07 DIAGNOSIS — Z96651 Presence of right artificial knee joint: Secondary | ICD-10-CM

## 2018-12-07 MED ORDER — OXYCODONE-ACETAMINOPHEN 5-325 MG PO TABS: 1.0000 | ORAL_TABLET | Freq: Three times a day (TID) | ORAL | 0 refills | Status: DC | PRN

## 2018-12-07 NOTE — Progress Notes (Signed)
Office Visit Note   Patient: Amber Church           Date of Birth: Nov 29, 1955           MRN: [id number] Visit Date: 12/07/2018              Requested by: Denita Lung, MD [address] [place],  [place] [address] PCP: Denita Lung, MD  Chief Complaint  Patient presents with  . Right Knee - Routine Post Op    10/02/18 right TKA       HPI: Patient is a 63 year old woman who presents in follow-up status post right total knee arthroplasty.  Patient states she does noticed a little bit of external rotation to the right lower extremity.  Patient states she is using 1 Percocet tablet at night to help with sleeping.  Assessment & Plan: Visit Diagnoses:  1. Total knee replacement status, right     Plan: Recommend continue with strengthening exercises.  Discussed the external rotation will improve some as her strength improves.  Discussed that there is a slight amount of external rotation built into the total knee replacement.  Follow-Up Instructions: Return if symptoms worsen or fail to improve.   Ortho Exam  Patient is alert, oriented, no adenopathy, well-dressed, normal affect, normal respiratory effort. Examination patient has full active extension there is no keloiding of the scar recommended continue with scar massage.  Recommend continue strengthening her patella tracks midline.  Patient is 2 months out from surgery.  Imaging: No results found. No images are attached to the encounter.  Labs: No results found for: HGBA1C, ESRSEDRATE, CRP, LABURIC, REPTSTATUS, GRAMSTAIN, CULT, LABORGA   Lab Results  Component Value Date   ALBUMIN 3.8 09/25/2018   ALBUMIN 4.3 08/18/2018   ALBUMIN 4.1 07/16/2018    No results found for: MG No results found for: VD25OH  No results found for: PREALBUMIN CBC EXTENDED Latest Ref Rng & Units 09/25/2018 07/16/2018 06/03/2017  WBC 4.0 - 10.5 K/uL 9.5 9.1 7.5  RBC 3.87 - 5.11 MIL/uL 4.15 4.28 4.47  HGB 12.0 - 15.0 g/dL 12.9  12.8 13.6  HCT 36.0 - 46.0 % 38.4 38.2 38.9  PLT 150 - 400 K/uL 345 325 336  NEUTROABS 1.4 - 7.0 x10E3/uL - 5.9 4.0  LYMPHSABS 0.7 - 3.1 x10E3/uL - 2.1 2.6     Body mass index is 35.7 kg/m.  Orders:  No orders of the defined types were placed in this encounter.  Meds ordered this encounter  Medications  . oxyCODONE-acetaminophen (PERCOCET) 5-325 MG tablet    Sig: Take 1 tablet by mouth every 8 (eight) hours as needed for severe pain.    Dispense:  20 tablet    Refill:  0     Procedures: No procedures performed  Clinical Data: No additional findings.  ROS:  All other systems negative, except as noted in the HPI. Review of Systems  Objective: Vital Signs: Ht 5\' 4"  (1.626 m)   Wt 208 lb (94.3 kg)   BMI 35.70 kg/m   Specialty Comments:  No specialty comments available.  PMFS History: Patient Active Problem List   Diagnosis Date Noted  . S/P TKR (total knee replacement), right 10/02/2018  . Unilateral primary osteoarthritis, right knee   . Achilles tendonitis, bilateral 05/13/2016  . Achilles tendon contracture, bilateral 05/13/2016  . Hypertension 08/06/2010  . Hyperlipidemia LDL goal <100 08/06/2010  . Obesity (BMI 30-39.9) 08/06/2010   Past Medical History:  Diagnosis Date  . Abnormal  EKG   . Abnormal pap    s/p cryotherapy  . Anxiety   . Arthritis   . Depression   . Diverticulosis   . GERD (gastroesophageal reflux disease)   . Hyperlipidemia   . Hypertension   . S/P colonoscopic polypectomy     Family History  Problem Relation Age of Onset  . Alzheimer's disease Mother   . Hyperlipidemia Mother   . Other Father        died of gunshot wound  . Osteoporosis Sister   . Osteoporosis Sister   . ALS Maternal Aunt   . ALS Maternal Uncle   . Diabetes Paternal Aunt   . Cancer Maternal Grandfather        lung cancer  . Cancer Paternal Grandmother        ?type  . Heart disease Neg Hx     Past Surgical History:  Procedure Laterality Date  .  COLONOSCOPY  03/18/11; 2000   Dr. Amedeo Plenty; diverticulosis in sigmoid colon  . crysurgery  age 18   for abnormal pap  . LAMINECTOMY  03/2002  . TOTAL KNEE ARTHROPLASTY Right 10/02/2018  . TOTAL KNEE ARTHROPLASTY Right 10/02/2018   Procedure: RIGHT TOTAL KNEE ARTHROPLASTY;  Surgeon: Newt Minion, MD;  Location: [facility];  Service: Orthopedics;  Laterality: Right;   Social History   Occupational History  . Occupation: quality control    Employer: [employer]  Tobacco Use  . Smoking status: Never Smoker  . Smokeless tobacco: Never Used  Substance and Sexual Activity  . Alcohol use: Yes    Comment: glass of wine 1-2 times per week.  . Drug use: No  . Sexual activity: Yes    Partners: Male    Comment: postmenopausal

## 2019-01-05 ENCOUNTER — Telehealth: Payer: Self-pay | Admitting: Orthopedic Surgery

## 2019-01-05 ENCOUNTER — Other Ambulatory Visit: Payer: Self-pay | Admitting: Physician Assistant

## 2019-01-05 MED ORDER — OXYCODONE-ACETAMINOPHEN 5-325 MG PO TABS: 1.0000 | ORAL_TABLET | Freq: Three times a day (TID) | ORAL | 0 refills | Status: DC | PRN

## 2019-01-05 NOTE — Telephone Encounter (Signed)
Bevely Palmer please advise, thanks.

## 2019-01-05 NOTE — Telephone Encounter (Signed)
Amber Church please advise, thanks

## 2019-01-05 NOTE — Telephone Encounter (Signed)
I left her a message that this will be the last rx for oxycodone

## 2019-01-05 NOTE — Telephone Encounter (Signed)
Patient called to request an RX refill on her Oxycodone.  [facility] [place].  CB#[phone].  Thank you.

## 2019-01-12 ENCOUNTER — Telehealth: Payer: Self-pay | Admitting: Orthopedic Surgery

## 2019-01-12 ENCOUNTER — Other Ambulatory Visit: Payer: Self-pay | Admitting: Physician Assistant

## 2019-01-12 MED ORDER — OXYCODONE-ACETAMINOPHEN 5-325 MG PO TABS: 1.0000 | ORAL_TABLET | Freq: Three times a day (TID) | ORAL | 0 refills | Status: DC | PRN

## 2019-01-12 NOTE — Telephone Encounter (Signed)
Patient called. Her pharmacy never received her refill of Oxycodone. She said to make sure it was called into the [facility] listed in her chart.   Call back number: [phone]

## 2019-01-12 NOTE — Telephone Encounter (Signed)
Called into [facility]

## 2019-01-12 NOTE — Telephone Encounter (Signed)
Looks like refill on this medication was given last week but was sent to mail order pharm. Can you please resubmit and sent to [facility].

## 2019-02-05 ENCOUNTER — Telehealth: Payer: Self-pay | Admitting: Physical Medicine and Rehabilitation

## 2019-02-08 NOTE — Telephone Encounter (Signed)
[facility] [facility] but needs to consider spine/neurosurgery eval

## 2019-02-08 NOTE — Telephone Encounter (Signed)
Scheduled for 2/15 at 1000.

## 2019-02-11 ENCOUNTER — Ambulatory Visit: Payer: No Typology Code available for payment source | Admitting: Family Medicine

## 2019-02-11 ENCOUNTER — Other Ambulatory Visit: Payer: Self-pay

## 2019-02-11 ENCOUNTER — Encounter: Payer: Self-pay | Admitting: Family Medicine

## 2019-02-11 VITALS — BP 108/74 | HR 80 | Temp 97.8°F | Wt 211.8 lb

## 2019-02-11 DIAGNOSIS — R002 Palpitations: Secondary | ICD-10-CM | POA: Diagnosis not present

## 2019-02-11 DIAGNOSIS — M5416 Radiculopathy, lumbar region: Secondary | ICD-10-CM

## 2019-02-11 DIAGNOSIS — H6123 Impacted cerumen, bilateral: Secondary | ICD-10-CM

## 2019-02-11 DIAGNOSIS — Z96651 Presence of right artificial knee joint: Secondary | ICD-10-CM

## 2019-02-11 NOTE — Patient Instructions (Signed)
Use Cerumenex to help clean your ears out and if needed, you can return here for lavage.

## 2019-02-11 NOTE — Progress Notes (Signed)
   Subjective:    Patient ID: Amber Church, female    DOB: 11-17-1955, 64 y.o.   MRN: [id number]  HPI She states that approximately 6 weeks ago she had an episode of palpitations and has had about 5 of them since then.  They last several minutes but no associated pain, shortness of breath or diaphoresis.  She is on no new medications.  She does not smoke or do street drugs.  She did have an EKG in September.  She has had a recent TKR and also has had difficulty with lumbar radiculopathy.  She is scheduled for another epidural in the near future.   Review of Systems     Objective:   Physical Exam Alert and in no distress. Tympanic membranes difficult to see due to cerumen. Pharyngeal area is normal. Neck is supple without adenopathy or thyromegaly. Cardiac exam shows a regular sinus rhythm without murmurs or gallops. Lungs are clear to auscultation. DTRs are normal. EKG from September was reviewed and is normal. EKG today shows a normal rate and rhythm.  The parameters were all reviewed and are normal.  It is nonspecific ST-T changes unchanged from previous recording.     Assessment & Plan:  Palpitations - Plan: EKG 12-Lead, TSH, Cardiac event monitor  Status post total right knee replacement  Bilateral impacted cerumen  Lumbar radiculopathy Use Cerumenex to help clean your ears out and if needed, you can return here for lavage. Under the circumstances I think an event monitor would be appropriate.  Tried to teach her how to check her pulse but this was very difficult.

## 2019-02-12 ENCOUNTER — Encounter: Payer: Self-pay | Admitting: Family Medicine

## 2019-02-12 LAB — TSH: TSH: 1.02 u[IU]/mL (ref 0.450–4.500)

## 2019-02-16 ENCOUNTER — Telehealth: Payer: Self-pay | Admitting: Radiology

## 2019-02-16 NOTE — Telephone Encounter (Signed)
Enrolled patient for a 30 day [employer] Event monitor to be mailed to patients home. Brief instructions were gone over with the patients and she knows to expect the monitor to arrive in 5-7 days

## 2019-02-22 ENCOUNTER — Ambulatory Visit (INDEPENDENT_AMBULATORY_CARE_PROVIDER_SITE_OTHER): Payer: No Typology Code available for payment source | Admitting: Physical Medicine and Rehabilitation

## 2019-02-22 ENCOUNTER — Ambulatory Visit: Payer: Self-pay

## 2019-02-22 ENCOUNTER — Other Ambulatory Visit: Payer: Self-pay

## 2019-02-22 ENCOUNTER — Encounter: Payer: Self-pay | Admitting: Physical Medicine and Rehabilitation

## 2019-02-22 VITALS — BP 125/91 | HR 81

## 2019-02-22 DIAGNOSIS — M48062 Spinal stenosis, lumbar region with neurogenic claudication: Secondary | ICD-10-CM | POA: Diagnosis not present

## 2019-02-22 DIAGNOSIS — M5416 Radiculopathy, lumbar region: Secondary | ICD-10-CM

## 2019-02-22 MED ORDER — METHYLPREDNISOLONE ACETATE 80 MG/ML IJ SUSP
40.0000 mg | Freq: Once | INTRAMUSCULAR | Status: AC
  Administered 2019-02-22: 10:00:00 40 mg

## 2019-02-22 NOTE — Progress Notes (Signed)
 .  Numeric Pain Rating Scale and Functional Assessment Average Pain 8   In the last MONTH (on 0-10 scale) has pain interfered with the following?  1. General activity like being  able to carry out your everyday physical activities such as walking, climbing stairs, carrying groceries, or moving a chair?  Rating(8)   +Driver, -BT, -Dye Allergies.  

## 2019-02-22 NOTE — Procedures (Signed)
Lumbosacral Transforaminal Epidural Steroid Injection - Sub-Pedicular Approach with Fluoroscopic Guidance  Patient: Amber Church      Date of Birth: 1955/11/22 MRN: [id number] PCP: Denita Lung, MD      Visit Date: 02/22/2019   Universal Protocol:    Date/Time: 02/22/2019  Consent Given By: the patient  Position: PRONE  Additional Comments: Vital signs were monitored before and after the procedure. Patient was prepped and draped in the usual sterile fashion. The correct patient, procedure, and site was verified.   Injection Procedure Details:  Procedure Site One Meds Administered:  Meds ordered this encounter  Medications  . methylPREDNISolone acetate (DEPO-MEDROL) injection 40 mg    Laterality: Bilateral  Location/Site:  L4-L5  Needle size: 22 G  Needle type: Spinal  Needle Placement: Transforaminal  Findings:    -Comments: Excellent flow of contrast along the nerve and into the epidural space.  Procedure Details: After squaring off the end-plates to get a true AP view, the C-arm was positioned so that an oblique view of the foramen as noted above was visualized. The target area is just inferior to the "nose of the scotty dog" or sub pedicular. The soft tissues overlying this structure were infiltrated with 2-3 ml. of 1% Lidocaine without Epinephrine.  The spinal needle was inserted toward the target using a "trajectory" view along the fluoroscope beam.  Under AP and lateral visualization, the needle was advanced so it did not puncture dura and was located close the 6 O'Clock position of the pedical in AP tracterory. Biplanar projections were used to confirm position. Aspiration was confirmed to be negative for CSF and/or blood. A 1-2 ml. volume of Isovue-250 was injected and flow of contrast was noted at each level. Radiographs were obtained for documentation purposes.   After attaining the desired flow of contrast documented above, a 0.5 to 1.0 ml test dose  of 0.25% Marcaine was injected into each respective transforaminal space.  The patient was observed for 90 seconds post injection.  After no sensory deficits were reported, and normal lower extremity motor function was noted,   the above injectate was administered so that equal amounts of the injectate were placed at each foramen (level) into the transforaminal epidural space.   Additional Comments:  The patient tolerated the procedure well Dressing: 2 x 2 sterile gauze and Band-Aid    Post-procedure details: Patient was observed during the procedure. Post-procedure instructions were reviewed.  Patient left the clinic in stable condition.

## 2019-02-22 NOTE — Progress Notes (Signed)
Amber Church - 64 y.o. female MRN [id number]  Date of birth: 01-Apr-1955  Office Visit Note: Visit Date: 02/22/2019 PCP: Denita Lung, MD Referred by: Denita Lung, MD  Subjective: Chief Complaint  Patient presents with  . Lower Back - Pain   HPI: Amber Church is a 64 y.o. female who comes in today For planned repeat bilateral L4 transforaminal dural steroid injection.  Patient has moderate multifactorial stenosis at L4-5.  She has pretty classic symptoms of back pain with bilateral hip pain and neurogenic type claudication worse with walking.  She is followed from orthopedic standpoint by Dr. Meridee Score.  Her primary care physician is Dr. Redmond School and also helps with her back as well.  She did get relief in November for a few months.  We will repeat the injection today.  Consideration probably should be given to facet joint blocks at some point more for her back pain.  She does well with injections but she does get somewhat anxious with a lot of myofascial type pain.  In the future would give preprocedure Valium.  ROS Otherwise per HPI.  Assessment & Plan: Visit Diagnoses:  1. Lumbar radiculopathy   2. Spinal stenosis of lumbar region with neurogenic claudication     Plan: No additional findings.   Meds & Orders:  Meds ordered this encounter  Medications  . methylPREDNISolone acetate (DEPO-MEDROL) injection 40 mg    Orders Placed This Encounter  Procedures  . XR C-ARM NO REPORT  . Epidural Steroid injection    Follow-up: Return if symptoms worsen or fail to improve.   Procedures: No procedures performed  Lumbosacral Transforaminal Epidural Steroid Injection - Sub-Pedicular Approach with Fluoroscopic Guidance  Patient: Amber Church      Date of Birth: 08-Sep-1955 MRN: [id number] PCP: Denita Lung, MD      Visit Date: 02/22/2019   Universal Protocol:    Date/Time: 02/22/2019  Consent Given By: the patient  Position: PRONE  Additional  Comments: Vital signs were monitored before and after the procedure. Patient was prepped and draped in the usual sterile fashion. The correct patient, procedure, and site was verified.   Injection Procedure Details:  Procedure Site One Meds Administered:  Meds ordered this encounter  Medications  . methylPREDNISolone acetate (DEPO-MEDROL) injection 40 mg    Laterality: Bilateral  Location/Site:  L4-L5  Needle size: 22 G  Needle type: Spinal  Needle Placement: Transforaminal  Findings:    -Comments: Excellent flow of contrast along the nerve and into the epidural space.  Procedure Details: After squaring off the end-plates to get a true AP view, the C-arm was positioned so that an oblique view of the foramen as noted above was visualized. The target area is just inferior to the "nose of the Amber dog" or sub pedicular. The soft tissues overlying this structure were infiltrated with 2-3 ml. of 1% Lidocaine without Epinephrine.  The spinal needle was inserted toward the target using a "trajectory" view along the fluoroscope beam.  Under AP and lateral visualization, the needle was advanced so it did not puncture dura and was located close the 6 O'Clock position of the pedical in AP tracterory. Biplanar projections were used to confirm position. Aspiration was confirmed to be negative for CSF and/or blood. A 1-2 ml. volume of Isovue-250 was injected and flow of contrast was noted at each level. Radiographs were obtained for documentation purposes.   After attaining the desired flow of contrast documented above, a 0.5  to 1.0 ml test dose of 0.25% Marcaine was injected into each respective transforaminal space.  The patient was observed for 90 seconds post injection.  After no sensory deficits were reported, and normal lower extremity motor function was noted,   the above injectate was administered so that equal amounts of the injectate were placed at each foramen (level) into the  transforaminal epidural space.   Additional Comments:  The patient tolerated the procedure well Dressing: 2 x 2 sterile gauze and Band-Aid    Post-procedure details: Patient was observed during the procedure. Post-procedure instructions were reviewed.  Patient left the clinic in stable condition.     Clinical History: MRI LUMBAR SPINE WITHOUT CONTRAST  TECHNIQUE: Multiplanar, multisequence MR imaging of the lumbar spine was performed. No intravenous contrast was administered.  COMPARISON:  None.  FINDINGS: Segmentation:  Standard.  Alignment:  Minimal grade 1 anterolisthesis of  Vertebrae:  No fracture, evidence of discitis, or bone lesion.  Conus medullaris and cauda equina: Conus extends to the T12 level. Conus and cauda equina appear normal.  Paraspinal and other soft tissues: No acute paraspinal abnormality.  Disc levels:  Disc spaces: Degenerative disc disease with disc height loss at L4-5 and L5-S1.  T12-L1: No significant disc bulge. No evidence of neural foraminal stenosis. No central canal stenosis. Mild bilateral facet arthropathy.  L1-L2: No significant disc bulge. No evidence of neural foraminal stenosis. No central canal stenosis.  L2-L3: Mild broad-based disc bulge. Mild bilateral facet arthropathy. No evidence of neural foraminal stenosis. No central canal stenosis.  L3-L4: Mild broad-based disc bulge. Moderate bilateral facet arthropathy. No evidence of neural foraminal stenosis. Mild spinal stenosis. Bilateral lateral recess stenosis.  L4-L5: Broad-based disc bulge. Moderate left and mild right facet arthropathy. Moderate spinal stenosis and bilateral lateral recess stenosis. No evidence of neural foraminal stenosis.  L5-S1: No significant disc bulge. No evidence of neural foraminal stenosis. No central canal stenosis. Mild bilateral facet arthropathy.  IMPRESSION: 1. Lumbar spine spondylosis as  described.   Electronically Signed   By: Kathreen Devoid   On: 03/30/2018 11:12   She reports that she has never smoked. She has never used smokeless tobacco. No results for input(s): HGBA1C, LABURIC in the last 8760 hours.  Objective:  VS:  HT:    WT:   BMI:     BP:(!) 125/91  HR:81bpm  TEMP: ( )  RESP:  Physical Exam  Ortho Exam Imaging: XR C-ARM NO REPORT  Result Date: 02/22/2019 Please see Notes tab for imaging impression.   Past Medical/Family/Surgical/Social History: Medications & Allergies reviewed per EMR, new medications updated. Patient Active Problem List   Diagnosis Date Noted  . S/P TKR (total knee replacement), right 10/02/2018  . Unilateral primary osteoarthritis, right knee   . Achilles tendonitis, bilateral 05/13/2016  . Achilles tendon contracture, bilateral 05/13/2016  . Hypertension 08/06/2010  . Hyperlipidemia LDL goal <100 08/06/2010  . Obesity (BMI 30-39.9) 08/06/2010   Past Medical History:  Diagnosis Date  . Abnormal EKG   . Abnormal pap    s/p cryotherapy  . Anxiety   . Arthritis   . Depression   . Diverticulosis   . GERD (gastroesophageal reflux disease)   . Hyperlipidemia   . Hypertension   . S/P colonoscopic polypectomy    Family History  Problem Relation Age of Onset  . Alzheimer's disease Mother   . Hyperlipidemia Mother   . Other Father        died of gunshot wound  .  Osteoporosis Sister   . Osteoporosis Sister   . ALS Maternal Aunt   . ALS Maternal Uncle   . Diabetes Paternal Aunt   . Cancer Maternal Grandfather        lung cancer  . Cancer Paternal Grandmother        ?type  . Heart disease Neg Hx    Past Surgical History:  Procedure Laterality Date  . COLONOSCOPY  03/18/11; 2000   Dr. Amedeo Plenty; diverticulosis in sigmoid colon  . crysurgery  age 43   for abnormal pap  . LAMINECTOMY  03/2002  . TOTAL KNEE ARTHROPLASTY Right 10/02/2018  . TOTAL KNEE ARTHROPLASTY Right 10/02/2018   Procedure: RIGHT TOTAL KNEE  ARTHROPLASTY;  Surgeon: Newt Minion, MD;  Location: [facility];  Service: Orthopedics;  Laterality: Right;   Social History   Occupational History  . Occupation: quality control    Employer: [employer]  Tobacco Use  . Smoking status: Never Smoker  . Smokeless tobacco: Never Used  Substance and Sexual Activity  . Alcohol use: Yes    Comment: glass of wine 1-2 times per week.  . Drug use: No  . Sexual activity: Yes    Partners: Male    Comment: postmenopausal

## 2019-03-02 ENCOUNTER — Encounter (INDEPENDENT_AMBULATORY_CARE_PROVIDER_SITE_OTHER): Payer: No Typology Code available for payment source

## 2019-03-02 DIAGNOSIS — R002 Palpitations: Secondary | ICD-10-CM

## 2019-06-04 ENCOUNTER — Telehealth: Payer: Self-pay | Admitting: Physical Medicine and Rehabilitation

## 2019-06-04 NOTE — Telephone Encounter (Signed)
Pt called wanting to make an appt.  [phone]

## 2019-06-08 ENCOUNTER — Telehealth: Payer: Self-pay | Admitting: Physical Medicine and Rehabilitation

## 2019-06-08 NOTE — Telephone Encounter (Signed)
Patient called. She would like an appointment with Dr. Newton.  

## 2019-06-08 NOTE — Telephone Encounter (Signed)
Ok but needs valium, also may want surgical consultation at some point

## 2019-06-08 NOTE — Telephone Encounter (Signed)
Valium- [facility] in [place].

## 2019-06-08 NOTE — Telephone Encounter (Signed)
Bilateral L4 TF 02/22/19. Ok to repeat if helped, same problem/side, and no new injury? Valium

## 2019-06-09 ENCOUNTER — Other Ambulatory Visit: Payer: Self-pay | Admitting: Physical Medicine and Rehabilitation

## 2019-06-09 DIAGNOSIS — F411 Generalized anxiety disorder: Secondary | ICD-10-CM

## 2019-06-09 MED ORDER — DIAZEPAM 5 MG PO TABS: ORAL_TABLET | ORAL | 0 refills | Status: DC

## 2019-06-09 NOTE — Progress Notes (Signed)
Pre-procedure diazepam ordered for pre-operative anxiety.  

## 2019-06-09 NOTE — Telephone Encounter (Signed)
Done

## 2019-06-10 ENCOUNTER — Encounter: Payer: Self-pay | Admitting: Family Medicine

## 2019-06-10 ENCOUNTER — Ambulatory Visit: Payer: No Typology Code available for payment source | Admitting: Family Medicine

## 2019-06-10 ENCOUNTER — Other Ambulatory Visit: Payer: Self-pay

## 2019-06-10 VITALS — BP 124/88 | HR 85 | Temp 98.4°F | Wt 211.6 lb

## 2019-06-10 DIAGNOSIS — Z96651 Presence of right artificial knee joint: Secondary | ICD-10-CM

## 2019-06-10 DIAGNOSIS — I10 Essential (primary) hypertension: Secondary | ICD-10-CM | POA: Diagnosis not present

## 2019-06-10 DIAGNOSIS — G8929 Other chronic pain: Secondary | ICD-10-CM

## 2019-06-10 DIAGNOSIS — E785 Hyperlipidemia, unspecified: Secondary | ICD-10-CM | POA: Diagnosis not present

## 2019-06-10 DIAGNOSIS — M5442 Lumbago with sciatica, left side: Secondary | ICD-10-CM | POA: Diagnosis not present

## 2019-06-10 MED ORDER — PRAVASTATIN SODIUM 40 MG PO TABS: 40.0000 mg | ORAL_TABLET | Freq: Every day | ORAL | 3 refills | Status: DC

## 2019-06-10 MED ORDER — LISINOPRIL-HYDROCHLOROTHIAZIDE 10-12.5 MG PO TABS: 1.0000 | ORAL_TABLET | Freq: Every day | ORAL | 3 refills | Status: DC

## 2019-06-10 MED ORDER — HYDROCODONE-ACETAMINOPHEN 5-325 MG PO TABS: 1.0000 | ORAL_TABLET | Freq: Four times a day (QID) | ORAL | 0 refills | Status: DC | PRN

## 2019-06-10 NOTE — Patient Instructions (Signed)
Take 2 Tylenol 4 times per day and you can take 800 mg of the ibuprofen 3 times per day.  And I will call in some codeine if you need extra help

## 2019-06-10 NOTE — Progress Notes (Signed)
   Subjective:    Patient ID: Amber Church, female    DOB: Jul 17, 1955, 64 y.o.   MRN: [id number]  HPI She is here for evaluation of continued difficulty with back pain.  She has had difficulty with this for several years and recently did have an epidural injection.  This apparently did help but on Wednesday of this week she noted some left-sided low back pain with radiation down her leg which is apparently a new symptom.  She has had an MRI done in March 2020 which did show some degenerative changes.  She is taking ibuprofen 800 mg/day. She also needs some follow-up blood work and medication renewal.  Pain "I feel better  Review of Systems     Objective:   Physical Exam Alert and complaining of left-sided low back pain.  Good motion of her back.  Normal hip motion.  Straight leg raising is positive at approximately 60 degrees with questionable sciatic stretch.  DTRs are normal.      Assessment & Plan:  Status post total right knee replacement - Plan: HYDROcodone-acetaminophen (NORCO/VICODIN) 5-325 MG tablet  Chronic left-sided low back pain with left-sided sciatica  Essential hypertension - Plan: Comprehensive metabolic panel  Hyperlipidemia LDL goal <100 - Plan: Lipid panel Take 2 Tylenol 4 times per day and you can take 800 mg of the ibuprofen 3 times per day.  And I will call in some codeine if you need extra help

## 2019-06-11 LAB — COMPREHENSIVE METABOLIC PANEL
ALT: 15 IU/L (ref 0–32)
AST: 20 IU/L (ref 0–40)
Albumin/Globulin Ratio: 1.6 (ref 1.2–2.2)
Albumin: 4.5 g/dL (ref 3.8–4.8)
Alkaline Phosphatase: 95 IU/L (ref 48–121)
BUN/Creatinine Ratio: 17 (ref 12–28)
BUN: 17 mg/dL (ref 8–27)
Bilirubin Total: 0.5 mg/dL (ref 0.0–1.2)
CO2: 22 mmol/L (ref 20–29)
Calcium: 9.6 mg/dL (ref 8.7–10.3)
Chloride: 99 mmol/L (ref 96–106)
Creatinine, Ser: 1.01 mg/dL — ABNORMAL HIGH (ref 0.57–1.00)
GFR calc Af Amer: 68 mL/min/{1.73_m2} (ref >59)
GFR calc non Af Amer: 59 mL/min/{1.73_m2} — ABNORMAL LOW (ref >59)
Globulin, Total: 2.9 g/dL (ref 1.5–4.5)
Glucose: 90 mg/dL (ref 65–99)
Potassium: 4.7 mmol/L (ref 3.5–5.2)
Sodium: 136 mmol/L (ref 134–144)
Total Protein: 7.4 g/dL (ref 6.0–8.5)

## 2019-06-11 LAB — LIPID PANEL
Chol/HDL Ratio: 2.9 ratio (ref 0.0–4.4)
Cholesterol, Total: 179 mg/dL (ref 100–199)
HDL: 61 mg/dL (ref >39)
LDL Chol Calc (NIH): 95 mg/dL (ref 0–99)
Triglycerides: 132 mg/dL (ref 0–149)
VLDL Cholesterol Cal: 23 mg/dL (ref 5–40)

## 2019-06-22 ENCOUNTER — Telehealth: Payer: Self-pay

## 2019-06-22 DIAGNOSIS — Z96651 Presence of right artificial knee joint: Secondary | ICD-10-CM

## 2019-06-22 NOTE — Telephone Encounter (Signed)
Pt. Called LM a message stating that she saw you on 06/10/19 for her back pain and you gave her Hydrocodone 20 pills she ran out early because some nights she had to take two because it was hurting so bad. She doesn't have an apt. With Dr. Ernestina Patches until 23rd. She has 2 pills left and needs a refill called in.

## 2019-06-23 MED ORDER — HYDROCODONE-ACETAMINOPHEN 5-325 MG PO TABS: 1.0000 | ORAL_TABLET | Freq: Four times a day (QID) | ORAL | 0 refills | Status: DC | PRN

## 2019-06-23 NOTE — Telephone Encounter (Signed)
Pt was advised Amber Church 

## 2019-06-23 NOTE — Telephone Encounter (Signed)
Make sure that she knows that I can refill at this time but anything further will need to be through Dr. Ernestina Patches

## 2019-06-30 ENCOUNTER — Other Ambulatory Visit: Payer: Self-pay

## 2019-06-30 ENCOUNTER — Ambulatory Visit: Payer: Self-pay

## 2019-06-30 ENCOUNTER — Encounter: Payer: Self-pay | Admitting: Physical Medicine and Rehabilitation

## 2019-06-30 ENCOUNTER — Ambulatory Visit (INDEPENDENT_AMBULATORY_CARE_PROVIDER_SITE_OTHER): Payer: No Typology Code available for payment source | Admitting: Physical Medicine and Rehabilitation

## 2019-06-30 VITALS — BP 107/73 | HR 80

## 2019-06-30 DIAGNOSIS — M961 Postlaminectomy syndrome, not elsewhere classified: Secondary | ICD-10-CM

## 2019-06-30 DIAGNOSIS — M5416 Radiculopathy, lumbar region: Secondary | ICD-10-CM | POA: Diagnosis not present

## 2019-06-30 DIAGNOSIS — M48062 Spinal stenosis, lumbar region with neurogenic claudication: Secondary | ICD-10-CM | POA: Diagnosis not present

## 2019-06-30 MED ORDER — METHYLPREDNISOLONE ACETATE 80 MG/ML IJ SUSP
80.0000 mg | Freq: Once | INTRAMUSCULAR | Status: AC
  Administered 2019-06-30: 80 mg

## 2019-06-30 NOTE — Progress Notes (Signed)
Amber Church - 64 y.o. female MRN [id number]  Date of birth: Jun 07, 1955  Office Visit Note: Visit Date: 06/30/2019 PCP: Denita Lung, MD Referred by: Denita Lung, MD  Subjective: Chief Complaint  Patient presents with  . Lower Back - Pain   HPI:  Amber Church is a 64 y.o. female who comes in today for planned repeat Left L4-L5 Lumbar epidural steroid injection with fluoroscopic guidance.  The patient has failed conservative care including home exercise, medications, time and activity modification.  This injection will be diagnostic and hopefully therapeutic.  Please see requesting physician notes for further details and justification. Patient received more than 50% pain relief from prior injection.   Referring: Dr. Meridee Score    ROS Otherwise per HPI.  Assessment & Plan: Visit Diagnoses:  1. Lumbar radiculopathy   2. Spinal stenosis of lumbar region with neurogenic claudication   3. Post laminectomy syndrome     Plan: No additional findings.   Meds & Orders:  Meds ordered this encounter  Medications  . methylPREDNISolone acetate (DEPO-MEDROL) injection 80 mg    Orders Placed This Encounter  Procedures  . XR C-ARM NO REPORT  . Epidural Steroid injection    Follow-up: Return if symptoms worsen or fail to improve.   Procedures: No procedures performed  Lumbosacral Transforaminal Epidural Steroid Injection - Sub-Pedicular Approach with Fluoroscopic Guidance  Patient: Amber Church      Date of Birth: [birth year] MRN: [id number] PCP: Denita Lung, MD      Visit Date: 06/30/2019   Universal Protocol:    Date/Time: 06/30/2019  Consent Given By: the patient  Position: PRONE  Additional Comments: Vital signs were monitored before and after the procedure. Patient was prepped and draped in the usual sterile fashion. The correct patient, procedure, and site was verified.   Injection Procedure Details:  Procedure Site One Meds  Administered:  Meds ordered this encounter  Medications  . methylPREDNISolone acetate (DEPO-MEDROL) injection 80 mg    Laterality: Left  Location/Site:  L4-L5  Needle size: 22 G  Needle type: Spinal  Needle Placement: Transforaminal  Findings:    -Comments: Excellent flow of contrast along the nerve, nerve root and into the epidural space.  Procedure Details: After squaring off the end-plates to get a true AP view, the C-arm was positioned so that an oblique view of the foramen as noted above was visualized. The target area is just inferior to the "nose of the scotty dog" or sub pedicular. The soft tissues overlying this structure were infiltrated with 2-3 ml. of 1% Lidocaine without Epinephrine.  The spinal needle was inserted toward the target using a "trajectory" view along the fluoroscope beam.  Under AP and lateral visualization, the needle was advanced so it did not puncture dura and was located close the 6 O'Clock position of the pedical in AP tracterory. Biplanar projections were used to confirm position. Aspiration was confirmed to be negative for CSF and/or blood. A 1-2 ml. volume of Isovue-250 was injected and flow of contrast was noted at each level. Radiographs were obtained for documentation purposes.   After attaining the desired flow of contrast documented above, a 0.5 to 1.0 ml test dose of 0.25% Marcaine was injected into each respective transforaminal space.  The patient was observed for 90 seconds post injection.  After no sensory deficits were reported, and normal lower extremity motor function was noted,   the above injectate was administered so that equal amounts of the  injectate were placed at each foramen (level) into the transforaminal epidural space.   Additional Comments:  The patient tolerated the procedure well Dressing: 2 x 2 sterile gauze and Band-Aid    Post-procedure details: Patient was observed during the procedure. Post-procedure instructions  were reviewed.  Patient left the clinic in stable condition.      Clinical History: MRI LUMBAR SPINE WITHOUT CONTRAST  TECHNIQUE: Multiplanar, multisequence MR imaging of the lumbar spine was performed. No intravenous contrast was administered.  COMPARISON:  None.  FINDINGS: Segmentation:  Standard.  Alignment:  Minimal grade 1 anterolisthesis of  Vertebrae:  No fracture, evidence of discitis, or bone lesion.  Conus medullaris and cauda equina: Conus extends to the T12 level. Conus and cauda equina appear normal.  Paraspinal and other soft tissues: No acute paraspinal abnormality.  Disc levels:  Disc spaces: Degenerative disc disease with disc height loss at L4-5 and L5-S1.  T12-L1: No significant disc bulge. No evidence of neural foraminal stenosis. No central canal stenosis. Mild bilateral facet arthropathy.  L1-L2: No significant disc bulge. No evidence of neural foraminal stenosis. No central canal stenosis.  L2-L3: Mild broad-based disc bulge. Mild bilateral facet arthropathy. No evidence of neural foraminal stenosis. No central canal stenosis.  L3-L4: Mild broad-based disc bulge. Moderate bilateral facet arthropathy. No evidence of neural foraminal stenosis. Mild spinal stenosis. Bilateral lateral recess stenosis.  L4-L5: Broad-based disc bulge. Moderate left and mild right facet arthropathy. Moderate spinal stenosis and bilateral lateral recess stenosis. No evidence of neural foraminal stenosis.  L5-S1: No significant disc bulge. No evidence of neural foraminal stenosis. No central canal stenosis. Mild bilateral facet arthropathy.  IMPRESSION: 1. Lumbar spine spondylosis as described.   Electronically Signed   By: Kathreen Devoid   On: 03/30/2018 11:12     Objective:  VS:  HT:    WT:   BMI:     BP:107/73  HR:80bpm  TEMP: ( )  RESP:  Physical Exam Constitutional:      General: She is not in acute distress.    Appearance:  Normal appearance. She is not ill-appearing.  HENT:     Head: Normocephalic and atraumatic.     Right Ear: External ear normal.     Left Ear: External ear normal.  Eyes:     Extraocular Movements: Extraocular movements intact.  Cardiovascular:     Rate and Rhythm: Normal rate.     Pulses: Normal pulses.  Musculoskeletal:     Right lower leg: No edema.     Left lower leg: No edema.     Comments: Patient has good distal strength with no pain over the greater trochanters.  No clonus or focal weakness.  Skin:    Findings: No erythema, lesion or rash.  Neurological:     General: No focal deficit present.     Mental Status: She is alert and oriented to person, place, and time.     Sensory: No sensory deficit.     Motor: No weakness or abnormal muscle tone.     Coordination: Coordination normal.  Psychiatric:        Mood and Affect: Mood normal.        Behavior: Behavior normal.      Imaging: No results found.

## 2019-06-30 NOTE — Progress Notes (Signed)
Lower back pain radiating into left buttock and left thigh. Last injection helped. Worse at night. Can only get comfortable lying on right side. Numeric Pain Rating Scale and Functional Assessment Average Pain 8   In the last MONTH (on 0-10 scale) has pain interfered with the following?  1. General activity like being  able to carry out your everyday physical activities such as walking, climbing stairs, carrying groceries, or moving a chair?  Rating(10)   +Driver, -BT, -Dye Allergies.

## 2019-06-30 NOTE — Procedures (Signed)
Lumbosacral Transforaminal Epidural Steroid Injection - Sub-Pedicular Approach with Fluoroscopic Guidance  Patient: Amber Church      Date of Birth: [birth year] MRN: [id number] PCP: Denita Lung, MD      Visit Date: 06/30/2019   Universal Protocol:    Date/Time: 06/30/2019  Consent Given By: the patient  Position: PRONE  Additional Comments: Vital signs were monitored before and after the procedure. Patient was prepped and draped in the usual sterile fashion. The correct patient, procedure, and site was verified.   Injection Procedure Details:  Procedure Site One Meds Administered:  Meds ordered this encounter  Medications  . methylPREDNISolone acetate (DEPO-MEDROL) injection 80 mg    Laterality: Left  Location/Site:  L4-L5  Needle size: 22 G  Needle type: Spinal  Needle Placement: Transforaminal  Findings:    -Comments: Excellent flow of contrast along the nerve, nerve root and into the epidural space.  Procedure Details: After squaring off the end-plates to get a true AP view, the C-arm was positioned so that an oblique view of the foramen as noted above was visualized. The target area is just inferior to the "nose of the scotty dog" or sub pedicular. The soft tissues overlying this structure were infiltrated with 2-3 ml. of 1% Lidocaine without Epinephrine.  The spinal needle was inserted toward the target using a "trajectory" view along the fluoroscope beam.  Under AP and lateral visualization, the needle was advanced so it did not puncture dura and was located close the 6 O'Clock position of the pedical in AP tracterory. Biplanar projections were used to confirm position. Aspiration was confirmed to be negative for CSF and/or blood. A 1-2 ml. volume of Isovue-250 was injected and flow of contrast was noted at each level. Radiographs were obtained for documentation purposes.   After attaining the desired flow of contrast documented above, a 0.5 to 1.0 ml  test dose of 0.25% Marcaine was injected into each respective transforaminal space.  The patient was observed for 90 seconds post injection.  After no sensory deficits were reported, and normal lower extremity motor function was noted,   the above injectate was administered so that equal amounts of the injectate were placed at each foramen (level) into the transforaminal epidural space.   Additional Comments:  The patient tolerated the procedure well Dressing: 2 x 2 sterile gauze and Band-Aid    Post-procedure details: Patient was observed during the procedure. Post-procedure instructions were reviewed.  Patient left the clinic in stable condition.

## 2019-07-13 ENCOUNTER — Telehealth: Payer: Self-pay

## 2019-07-13 NOTE — Telephone Encounter (Signed)
Patient called in wanting to sch an appt for her pain.

## 2019-07-14 NOTE — Telephone Encounter (Signed)
Please Advise

## 2019-07-14 NOTE — Telephone Encounter (Signed)
Pt called wanting a repeat inj left L4-5 on 6\23\21. If no new changes, traumas/injuries okay to repeat? Pt also requested Valium prior to appt. Pharmacy [facility] in [place]. Please Advise.

## 2019-07-16 NOTE — Telephone Encounter (Signed)
Ok for bilat L4 tf esi

## 2019-07-20 ENCOUNTER — Telehealth (HOSPITAL_COMMUNITY): Payer: Self-pay

## 2019-07-20 NOTE — Telephone Encounter (Signed)
Patient called in wanting to get an appt sch with dr Ernestina Patches

## 2019-07-21 NOTE — Telephone Encounter (Signed)
Left message #1

## 2019-07-22 ENCOUNTER — Telehealth: Payer: Self-pay

## 2019-07-22 NOTE — Telephone Encounter (Signed)
Patient called in again wanting to leave a message regarding setting up an appt with dr Amber Church an injection

## 2019-07-22 NOTE — Telephone Encounter (Signed)
See previous message

## 2019-07-22 NOTE — Telephone Encounter (Signed)
[place] for repeat vs OV if unsure

## 2019-07-22 NOTE — Telephone Encounter (Signed)
Patient had left L4 TF on 6/23. She reports that she had about 50% relief for a few days, but the pain returned. Please advise.

## 2019-07-23 NOTE — Telephone Encounter (Signed)
Patient is scheduled for 7/27. She is requesting valium. She is also asking if you can prescribe something to help her sleep since she is having trouble sleeping due to the pain. Please advise.

## 2019-07-26 ENCOUNTER — Other Ambulatory Visit: Payer: Self-pay | Admitting: Physical Medicine and Rehabilitation

## 2019-07-26 MED ORDER — TRAZODONE HCL 50 MG PO TABS: 50.0000 mg | ORAL_TABLET | Freq: Every day | ORAL | 0 refills | Status: DC

## 2019-07-26 MED ORDER — DIAZEPAM 5 MG PO TABS: ORAL_TABLET | ORAL | 0 refills | Status: DC

## 2019-07-26 NOTE — Progress Notes (Signed)
Pre-procedure diazepam ordered for pre-operative anxiety.   Also wanted trial of sleep medication.

## 2019-07-26 NOTE — Telephone Encounter (Signed)
Refilled the valium, sent in one time Rx for trazadone, if that works and she needs continued she can see Dr. Redmond School

## 2019-08-03 ENCOUNTER — Other Ambulatory Visit: Payer: Self-pay

## 2019-08-03 ENCOUNTER — Ambulatory Visit (INDEPENDENT_AMBULATORY_CARE_PROVIDER_SITE_OTHER): Payer: No Typology Code available for payment source | Admitting: Physical Medicine and Rehabilitation

## 2019-08-03 ENCOUNTER — Ambulatory Visit: Payer: Self-pay

## 2019-08-03 ENCOUNTER — Encounter: Payer: Self-pay | Admitting: Physical Medicine and Rehabilitation

## 2019-08-03 VITALS — BP 134/95 | HR 86

## 2019-08-03 DIAGNOSIS — M5416 Radiculopathy, lumbar region: Secondary | ICD-10-CM

## 2019-08-03 DIAGNOSIS — M48062 Spinal stenosis, lumbar region with neurogenic claudication: Secondary | ICD-10-CM

## 2019-08-03 DIAGNOSIS — M7072 Other bursitis of hip, left hip: Secondary | ICD-10-CM

## 2019-08-03 DIAGNOSIS — M961 Postlaminectomy syndrome, not elsewhere classified: Secondary | ICD-10-CM

## 2019-08-03 MED ORDER — METHYLPREDNISOLONE ACETATE 80 MG/ML IJ SUSP: 80.0000 mg | Freq: Once | INTRAMUSCULAR | Status: DC

## 2019-08-03 NOTE — Progress Notes (Signed)
Left Ischial bursa, then MRI Pt states lower back pain that travel down the posterior of her left leg. Pt states laying down makes it worse. Pt states laying on her right side help with the pain and meds.   Pt has hx on inj on 06/30/19 help for four to five days pt states everything still the same.  Numeric Pain Rating Scale and Functional Assessment Average Pain 7   In the last MONTH (on 0-10 scale) has pain interfered with the following?  1. General activity like being  able to carry out your everyday physical activities such as walking, climbing stairs, carrying groceries, or moving a chair?  Rating(10)   +Driver, -BT, -Dye Allergies.

## 2019-08-11 ENCOUNTER — Telehealth: Payer: Self-pay | Admitting: Physical Medicine and Rehabilitation

## 2019-08-11 NOTE — Telephone Encounter (Signed)
L4-5 facet, send me message back to refill trazadone

## 2019-08-11 NOTE — Telephone Encounter (Signed)
Patient had ischial bursa injection on 7/27. She reports at least 50% relief of the pain she was having at that time. She reports that over the last 3 days she has had pain in her low back (right worse than left). She is also requesting a refill of trazodone. Please advise.

## 2019-08-11 NOTE — Telephone Encounter (Signed)
Patient called needing to schedule an appointment  For an injection with Dr Ernestina Patches for her lower back. The number to contact patient is [phone]

## 2019-08-12 ENCOUNTER — Other Ambulatory Visit: Payer: Self-pay | Admitting: Physical Medicine and Rehabilitation

## 2019-08-12 MED ORDER — TRAZODONE HCL 50 MG PO TABS: 50.0000 mg | ORAL_TABLET | Freq: Every day | ORAL | 0 refills | Status: DC

## 2019-08-12 NOTE — Telephone Encounter (Signed)
Scheduled for 8/12 at 0945 with driver.

## 2019-08-18 ENCOUNTER — Telehealth: Payer: Self-pay | Admitting: Physical Medicine and Rehabilitation

## 2019-08-18 ENCOUNTER — Other Ambulatory Visit: Payer: Self-pay | Admitting: Physical Medicine and Rehabilitation

## 2019-08-18 MED ORDER — DIAZEPAM 5 MG PO TABS: ORAL_TABLET | ORAL | 0 refills | Status: DC

## 2019-08-18 NOTE — Telephone Encounter (Signed)
Please advise on Valium.

## 2019-08-18 NOTE — Telephone Encounter (Signed)
Patient called requesting a refill of medication to relax patient before injections. Please send to [facility] in [place]. Please call patient with any questions and when medication is called into pharmacy. Patient phone number is [phone].

## 2019-08-19 ENCOUNTER — Ambulatory Visit: Payer: Self-pay

## 2019-08-19 ENCOUNTER — Ambulatory Visit (INDEPENDENT_AMBULATORY_CARE_PROVIDER_SITE_OTHER): Payer: No Typology Code available for payment source | Admitting: Physical Medicine and Rehabilitation

## 2019-08-19 ENCOUNTER — Encounter: Payer: Self-pay | Admitting: Physical Medicine and Rehabilitation

## 2019-08-19 ENCOUNTER — Other Ambulatory Visit: Payer: Self-pay

## 2019-08-19 VITALS — BP 106/80 | HR 59 | Wt 204.0 lb

## 2019-08-19 DIAGNOSIS — M47816 Spondylosis without myelopathy or radiculopathy, lumbar region: Secondary | ICD-10-CM

## 2019-08-19 MED ORDER — METHYLPREDNISOLONE ACETATE 80 MG/ML IJ SUSP
80.0000 mg | Freq: Once | INTRAMUSCULAR | Status: AC
  Administered 2019-08-19: 80 mg

## 2019-08-19 NOTE — Progress Notes (Signed)
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 Taking ibuprofen and tylenol for pain.   Numeric Pain Rating Scale and Functional Assessment Average Pain 6   In the last MONTH (on 0-10 scale) has pain interfered with the following?  1. General activity like being  able to carry out your everyday physical activities such as walking, climbing stairs, carrying groceries, or moving a chair?  Rating(6)   +Driver, -BT, -Dye Allergies.

## 2019-09-07 ENCOUNTER — Encounter: Payer: Self-pay | Admitting: Physical Medicine and Rehabilitation

## 2019-09-07 MED ORDER — TRIAMCINOLONE ACETONIDE 40 MG/ML IJ SUSP
60.0000 mg | INTRAMUSCULAR | Status: AC | PRN
  Administered 2019-08-03: 60 mg via INTRA_ARTICULAR

## 2019-09-07 MED ORDER — BUPIVACAINE HCL 0.25 % IJ SOLN
4.0000 mL | INTRAMUSCULAR | Status: AC | PRN
  Administered 2019-08-03: 4 mL via INTRA_ARTICULAR

## 2019-09-07 NOTE — Progress Notes (Signed)
MAIDIE STREIGHT - 64 y.o. female MRN [id number]  Date of birth: 11/27/1955  Office Visit Note: Visit Date: 08/03/2019 PCP: Denita Lung, MD Referred by: Denita Lung, MD  Subjective: Chief Complaint  Patient presents with  . Lower Back - Pain   HPI:  ARMIE MOREN is a 64 y.o. female who comes in today For evaluation and possible repeat lumbar epidural injection.  Patient had a couple of injections at L4 at least 10 February did have some relief.  Somewhat of a poor historian.  Last injection in June seemed only helped for a little while.  Originally referred here by Dr. Meridee Score.  She has pain really in the left posterior buttock and top of the hamstring.  Symptoms worse with sitting.  Somewhat consistent with ischial bursitis.  From a diagnostic standpoint going to complete a diagnostic ischial bursa injection today.  Depending on relief with that would look at repeating MRI of the lumbar spine.  No red flag complaints.  ROS Otherwise per HPI.  Assessment & Plan: Visit Diagnoses:  1. Ischial bursitis of left side   2. Lumbar radiculopathy   3. Spinal stenosis of lumbar region with neurogenic claudication   4. Post laminectomy syndrome     Plan: No additional findings.   Meds & Orders:  Meds ordered this encounter  Medications  . DISCONTD: methylPREDNISolone acetate (DEPO-MEDROL) injection 80 mg    Orders Placed This Encounter  Procedures  . Large Joint Inj  . XR C-ARM NO REPORT    Follow-up: Return if symptoms worsen or fail to improve, for Consider repeat lumbar spine MRI.   Procedures: Left ischial bursa (Left ischial bursa) on 08/03/2019 3:10 PM Indications: diagnostic evaluation and pain Details: 22 G 3.5 in needle, fluoroscopy-guided posterior approach  Arthrogram: No  Medications: 4 mL bupivacaine 0.25 %; 60 mg triamcinolone acetonide 40 MG/ML Outcome: tolerated well, no immediate complications  There was excellent flow of contrast producing a  partial bursa gram. Procedure, treatment alternatives, risks and benefits explained, specific risks discussed. Consent was given by the patient. Immediately prior to procedure a time out was called to verify the correct patient, procedure, equipment, support staff and site/side marked as required. Patient was prepped and draped in the usual sterile fashion.      No notes on file   Clinical History: MRI LUMBAR SPINE WITHOUT CONTRAST  TECHNIQUE: Multiplanar, multisequence MR imaging of the lumbar spine was performed. No intravenous contrast was administered.  COMPARISON:  None.  FINDINGS: Segmentation:  Standard.  Alignment:  Minimal grade 1 anterolisthesis of  Vertebrae:  No fracture, evidence of discitis, or bone lesion.  Conus medullaris and cauda equina: Conus extends to the T12 level. Conus and cauda equina appear normal.  Paraspinal and other soft tissues: No acute paraspinal abnormality.  Disc levels:  Disc spaces: Degenerative disc disease with disc height loss at L4-5 and L5-S1.  T12-L1: No significant disc bulge. No evidence of neural foraminal stenosis. No central canal stenosis. Mild bilateral facet arthropathy.  L1-L2: No significant disc bulge. No evidence of neural foraminal stenosis. No central canal stenosis.  L2-L3: Mild broad-based disc bulge. Mild bilateral facet arthropathy. No evidence of neural foraminal stenosis. No central canal stenosis.  L3-L4: Mild broad-based disc bulge. Moderate bilateral facet arthropathy. No evidence of neural foraminal stenosis. Mild spinal stenosis. Bilateral lateral recess stenosis.  L4-L5: Broad-based disc bulge. Moderate left and mild right facet arthropathy. Moderate spinal stenosis and bilateral lateral recess stenosis. No  evidence of neural foraminal stenosis.  L5-S1: No significant disc bulge. No evidence of neural foraminal stenosis. No central canal stenosis. Mild bilateral  facet arthropathy.  IMPRESSION: 1. Lumbar spine spondylosis as described.   Electronically Signed   By: Kathreen Devoid   On: 03/30/2018 11:12     Objective:  VS:  HT:    WT:   BMI:     BP:(!) 134/95  HR:86bpm  TEMP: ( )  RESP:  Physical Exam Constitutional:      General: She is not in acute distress.    Appearance: Normal appearance. She is not ill-appearing.  HENT:     Head: Normocephalic and atraumatic.     Right Ear: External ear normal.     Left Ear: External ear normal.  Eyes:     Extraocular Movements: Extraocular movements intact.  Cardiovascular:     Rate and Rhythm: Normal rate.     Pulses: Normal pulses.  Musculoskeletal:     Right lower leg: No edema.     Left lower leg: No edema.     Comments: Patient has good distal strength with no pain over the greater trochanters.  No clonus or focal weakness.  Concordant pain over the left ischial bursa.  Skin:    Findings: No erythema, lesion or rash.  Neurological:     General: No focal deficit present.     Mental Status: She is alert and oriented to person, place, and time.     Sensory: No sensory deficit.     Motor: No weakness or abnormal muscle tone.     Coordination: Coordination normal.  Psychiatric:        Mood and Affect: Mood normal.        Behavior: Behavior normal.      Imaging: No results found.

## 2019-09-09 ENCOUNTER — Ambulatory Visit (INDEPENDENT_AMBULATORY_CARE_PROVIDER_SITE_OTHER): Payer: No Typology Code available for payment source

## 2019-09-09 ENCOUNTER — Ambulatory Visit (INDEPENDENT_AMBULATORY_CARE_PROVIDER_SITE_OTHER): Payer: No Typology Code available for payment source | Admitting: Physician Assistant

## 2019-09-09 ENCOUNTER — Encounter: Payer: Self-pay | Admitting: Orthopedic Surgery

## 2019-09-09 VITALS — Ht 64.0 in | Wt 204.0 lb

## 2019-09-09 DIAGNOSIS — M25561 Pain in right knee: Secondary | ICD-10-CM

## 2019-09-09 MED ORDER — PREDNISONE 10 MG PO TABS: 10.0000 mg | ORAL_TABLET | Freq: Every day | ORAL | 0 refills | Status: DC

## 2019-09-09 NOTE — Progress Notes (Signed)
Office Visit Note   Patient: Amber Church           Date of Birth: [birth year]           MRN: [id number] Visit Date: 09/09/2019              Requested by: Denita Lung, MD [address] [place],  [place] [address] PCP: Denita Lung, MD  Chief Complaint  Patient presents with  . Right Knee - Pain    S/p fall 1 month ago direct impact on knee s/p total joint replacement.   . Lower Back - Pain      HPI: This is a pleasant 64 year old woman who comes in for 2 separate issues. First she has a history of left buttock pain that radiates down the posterior aspect of her leg. She has had epidural steroid injections recently with Dr. Ernestina Patches. She states this helped some. He also gave her an injection into the bursa which helped a little bit as well. She has no groin pain. She has no pain with internal or external rotation of her hip. She also is status post right knee replacement in September of 2020. She had a fall about a month ago onto her knee. It was not a hard fall and she seems to have recovered fairly well but she wants to make sure she has not damaged her knee replacement  Assessment & Plan: Visit Diagnoses:  1. Acute pain of right knee     Plan: I had a long discussion with the patient today. We talked about doing some physical therapy for her back. I am placing her on a short course of prednisone. She is wondering if there is any home exercises she can do with regards to her back. I did give her handout on mild core strengthening and stretching exercises. Went over the 1 exercises that might be beneficial to her. She will follow-up in 3 weeks for evaluation. As for her knee replacement this looks fine and clinically she looks excellent  Follow-Up Instructions: No follow-ups on file.   Ortho Exam  Patient is alert, oriented, no adenopathy, well-dressed, normal affect, normal respiratory effort. Right knee: Well-healed surgical incision. No effusion no swelling no  cellulitis no pain with range of motion she has excellent quadricep strength.  Focused examination of her left buttock pain she does not have any pain in her groin no pain with internal/external rotation of her hip. She does have some tenderness to deep palpation over the sciatic nerve. No distal weakness excellent plantar flexion strength. Sensation is intact.  Imaging: No results found. No images are attached to the encounter.  Labs: No results found for: HGBA1C, ESRSEDRATE, CRP, LABURIC, REPTSTATUS, GRAMSTAIN, CULT, LABORGA   Lab Results  Component Value Date   ALBUMIN 4.5 06/10/2019   ALBUMIN 3.8 09/25/2018   ALBUMIN 4.3 08/18/2018    No results found for: MG No results found for: VD25OH  No results found for: PREALBUMIN CBC EXTENDED Latest Ref Rng & Units 09/25/2018 07/16/2018 06/03/2017  WBC 4.0 - 10.5 K/uL 9.5 9.1 7.5  RBC 3.87 - 5.11 MIL/uL 4.15 4.28 4.47  HGB 12.0 - 15.0 g/dL 12.9 12.8 13.6  HCT 36 - 46 % 38.4 38.2 38.9  PLT 150 - 400 K/uL 345 325 336  NEUTROABS 1 - 7 x10E3/uL - 5.9 4.0  LYMPHSABS 0 - 3 x10E3/uL - 2.1 2.6     Body mass index is 35.02 kg/m.  Orders:  Orders Placed This  Encounter  Procedures  . XR Knee 1-2 Views Right   Meds ordered this encounter  Medications  . predniSONE (DELTASONE) 10 MG tablet    Sig: Take 1 tablet (10 mg total) by mouth daily with breakfast.    Dispense:  30 tablet    Refill:  0     Procedures: No procedures performed  Clinical Data: No additional findings.  ROS:  All other systems negative, except as noted in the HPI. Review of Systems  Objective: Vital Signs: Ht 5\' 4"  (1.626 m)   Wt 204 lb (92.5 kg)   BMI 35.02 kg/m   Specialty Comments:  No specialty comments available.  PMFS History: Patient Active Problem List   Diagnosis Date Noted  . S/P TKR (total knee replacement), right 10/02/2018  . Unilateral primary osteoarthritis, right knee   . Achilles tendonitis, bilateral 05/13/2016  . Achilles  tendon contracture, bilateral 05/13/2016  . Hypertension 08/06/2010  . Hyperlipidemia LDL goal <100 08/06/2010  . Obesity (BMI 30-39.9) 08/06/2010   Past Medical History:  Diagnosis Date  . Abnormal EKG   . Abnormal pap    s/p cryotherapy  . Anxiety   . Arthritis   . Depression   . Diverticulosis   . GERD (gastroesophageal reflux disease)   . Hyperlipidemia   . Hypertension   . S/P colonoscopic polypectomy     Family History  Problem Relation Age of Onset  . Alzheimer's disease Mother   . Hyperlipidemia Mother   . Other Father        died of gunshot wound  . Osteoporosis Sister   . Osteoporosis Sister   . ALS Maternal Aunt   . ALS Maternal Uncle   . Diabetes Paternal Aunt   . Cancer Maternal Grandfather        lung cancer  . Cancer Paternal Grandmother        ?type  . Heart disease Neg Hx     Past Surgical History:  Procedure Laterality Date  . COLONOSCOPY  03/18/11; 2000   Dr. Amedeo Plenty; diverticulosis in sigmoid colon  . crysurgery  age 27   for abnormal pap  . LAMINECTOMY  03/2002  . TOTAL KNEE ARTHROPLASTY Right 10/02/2018  . TOTAL KNEE ARTHROPLASTY Right 10/02/2018   Procedure: RIGHT TOTAL KNEE ARTHROPLASTY;  Surgeon: Newt Minion, MD;  Location: [facility];  Service: Orthopedics;  Laterality: Right;   Social History   Occupational History  . Occupation: quality control    Employer: [employer]  Tobacco Use  . Smoking status: Never Smoker  . Smokeless tobacco: Never Used  Vaping Use  . Vaping Use: Never used  Substance and Sexual Activity  . Alcohol use: Yes    Comment: glass of wine 1-2 times per week.  . Drug use: No  . Sexual activity: Yes    Partners: Male    Comment: postmenopausal

## 2019-09-19 NOTE — Procedures (Signed)
Lumbar Facet Joint Intra-Articular Injection(s) with Fluoroscopic Guidance  Patient: Amber Church      Date of Birth: 1955-10-13 MRN: [id number] PCP: Denita Lung, MD      Visit Date: 08/19/2019   Universal Protocol:    Date/Time: 08/19/2019  Consent Given By: the patient  Position: PRONE   Additional Comments: Vital signs were monitored before and after the procedure. Patient was prepped and draped in the usual sterile fashion. The correct patient, procedure, and site was verified.   Injection Procedure Details:  Procedure Site One Meds Administered:  Meds ordered this encounter  Medications  . methylPREDNISolone acetate (DEPO-MEDROL) injection 80 mg     Laterality: Bilateral  Location/Site:  L4-L5  Needle size: 22 guage  Needle type: Spinal  Needle Placement: Articular  Findings:  -Comments: Excellent flow of contrast producing a partial arthrogram.  Procedure Details: The fluoroscope beam is vertically oriented in AP, and the inferior recess is visualized beneath the lower pole of the inferior apophyseal process, which represents the target point for needle insertion. When direct visualization is difficult the target point is located at the medial projection of the vertebral pedicle. The region overlying each aforementioned target is locally anesthetized with a 1 to 2 ml. volume of 1% Lidocaine without Epinephrine.   The spinal needle was inserted into each of the above mentioned facet joints using biplanar fluoroscopic guidance. A 0.25 to 0.5 ml. volume of Isovue-250 was injected and a partial facet joint arthrogram was obtained. A single spot film was obtained of the resulting arthrogram.    One to 1.25 ml of the steroid/anesthetic solution was then injected into each of the facet joints noted above.   Additional Comments:  The patient tolerated the procedure well Dressing: 2 x 2 sterile gauze and Band-Aid    Post-procedure details: Patient was  observed during the procedure. Post-procedure instructions were reviewed.  Patient left the clinic in stable condition.

## 2019-09-19 NOTE — Progress Notes (Signed)
Amber Church - 64 y.o. female MRN [id number]  Date of birth: 11-Nov-1955  Office Visit Note: Visit Date: 08/19/2019 PCP: Denita Lung, MD Referred by: Denita Lung, MD  Subjective: Chief Complaint  Patient presents with  . Lower Back - Pain   HPI:  Amber Church is a 64 y.o. female who comes in today at the request of Dr. Laurence Spates for planned Bilateral L4-L5 Lumbar facet/medial branch block with fluoroscopic guidance.  The patient has failed conservative care including home exercise, medications, time and activity modification.  This injection will be diagnostic and hopefully therapeutic.  Please see requesting physician notes for further details and justification.  Exam has shown concordant pain with facet joint loading.  Referring physician: Meridee Score, MD  Please refer back to our prior notes and justification for this patient.  She really has failed conservative care for her back and leg pain.  Epidural injections seem to help to a degree but that she did get more relief with ischial bursa injection of her posterior hip pain.  She continues to have axial low back pain with MRI findings of facet arthropathy without stenosis or nerve compression.  We will complete diagnostic facet joint block today.  Could look at double diagnostic block and radiofrequency ablation.  ROS Otherwise per HPI.  Assessment & Plan: Visit Diagnoses:  1. Spondylosis without myelopathy or radiculopathy, lumbar region     Plan: No additional findings.   Meds & Orders:  Meds ordered this encounter  Medications  . methylPREDNISolone acetate (DEPO-MEDROL) injection 80 mg    Orders Placed This Encounter  Procedures  . Facet Injection  . XR C-ARM NO REPORT    Follow-up: Return if symptoms worsen or fail to improve.   Procedures: No procedures performed  Lumbar Facet Joint Intra-Articular Injection(s) with Fluoroscopic Guidance  Patient: Amber Church      Date of Birth:  1955/03/21 MRN: [id number] PCP: Denita Lung, MD      Visit Date: 08/19/2019   Universal Protocol:    Date/Time: 08/19/2019  Consent Given By: the patient  Position: PRONE   Additional Comments: Vital signs were monitored before and after the procedure. Patient was prepped and draped in the usual sterile fashion. The correct patient, procedure, and site was verified.   Injection Procedure Details:  Procedure Site One Meds Administered:  Meds ordered this encounter  Medications  . methylPREDNISolone acetate (DEPO-MEDROL) injection 80 mg     Laterality: Bilateral  Location/Site:  L4-L5  Needle size: 22 guage  Needle type: Spinal  Needle Placement: Articular  Findings:  -Comments: Excellent flow of contrast producing a partial arthrogram.  Procedure Details: The fluoroscope beam is vertically oriented in AP, and the inferior recess is visualized beneath the lower pole of the inferior apophyseal process, which represents the target point for needle insertion. When direct visualization is difficult the target point is located at the medial projection of the vertebral pedicle. The region overlying each aforementioned target is locally anesthetized with a 1 to 2 ml. volume of 1% Lidocaine without Epinephrine.   The spinal needle was inserted into each of the above mentioned facet joints using biplanar fluoroscopic guidance. A 0.25 to 0.5 ml. volume of Isovue-250 was injected and a partial facet joint arthrogram was obtained. A single spot film was obtained of the resulting arthrogram.    One to 1.25 ml of the steroid/anesthetic solution was then injected into each of the facet joints noted above.  Additional Comments:  The patient tolerated the procedure well Dressing: 2 x 2 sterile gauze and Band-Aid    Post-procedure details: Patient was observed during the procedure. Post-procedure instructions were reviewed.  Patient left the clinic in stable condition.      Clinical History: MRI LUMBAR SPINE WITHOUT CONTRAST  TECHNIQUE: Multiplanar, multisequence MR imaging of the lumbar spine was performed. No intravenous contrast was administered.  COMPARISON:  None.  FINDINGS: Segmentation:  Standard.  Alignment:  Minimal grade 1 anterolisthesis of  Vertebrae:  No fracture, evidence of discitis, or bone lesion.  Conus medullaris and cauda equina: Conus extends to the T12 level. Conus and cauda equina appear normal.  Paraspinal and other soft tissues: No acute paraspinal abnormality.  Disc levels:  Disc spaces: Degenerative disc disease with disc height loss at L4-5 and L5-S1.  T12-L1: No significant disc bulge. No evidence of neural foraminal stenosis. No central canal stenosis. Mild bilateral facet arthropathy.  L1-L2: No significant disc bulge. No evidence of neural foraminal stenosis. No central canal stenosis.  L2-L3: Mild broad-based disc bulge. Mild bilateral facet arthropathy. No evidence of neural foraminal stenosis. No central canal stenosis.  L3-L4: Mild broad-based disc bulge. Moderate bilateral facet arthropathy. No evidence of neural foraminal stenosis. Mild spinal stenosis. Bilateral lateral recess stenosis.  L4-L5: Broad-based disc bulge. Moderate left and mild right facet arthropathy. Moderate spinal stenosis and bilateral lateral recess stenosis. No evidence of neural foraminal stenosis.  L5-S1: No significant disc bulge. No evidence of neural foraminal stenosis. No central canal stenosis. Mild bilateral facet arthropathy.  IMPRESSION: 1. Lumbar spine spondylosis as described.   Electronically Signed   By: Kathreen Devoid   On: 03/30/2018 11:12     Objective:  VS:  HT:    WT:204 lb (92.5 kg)  BMI:     BP:106/80  HR:(!) 59bpm  TEMP: ( )  RESP:  Physical Exam Constitutional:      General: She is not in acute distress.    Appearance: Normal appearance. She is not ill-appearing.   HENT:     Head: Normocephalic and atraumatic.     Right Ear: External ear normal.     Left Ear: External ear normal.  Eyes:     Extraocular Movements: Extraocular movements intact.  Cardiovascular:     Rate and Rhythm: Normal rate.     Pulses: Normal pulses.  Musculoskeletal:     Right lower leg: No edema.     Left lower leg: No edema.     Comments: Patient has good distal strength with no pain over the greater trochanters.  No clonus or focal weakness. Patient somewhat slow to rise from a seated position to full extension.  There is concordant low back pain with facet loading and lumbar spine extension rotation.  There are no definitive trigger points but the patient is somewhat tender across the lower back and PSIS.  There is no pain with hip rotation.  Skin:    Findings: No erythema, lesion or rash.  Neurological:     General: No focal deficit present.     Mental Status: She is alert and oriented to person, place, and time.     Sensory: No sensory deficit.     Motor: No weakness or abnormal muscle tone.     Coordination: Coordination normal.  Psychiatric:        Mood and Affect: Mood normal.        Behavior: Behavior normal.      Imaging: No results found.

## 2019-10-13 ENCOUNTER — Telehealth: Payer: Self-pay | Admitting: Physician Assistant

## 2019-10-13 NOTE — Telephone Encounter (Signed)
Patient called needing Rx refilled Prednisone 10 mg. Patient uses the pharmacy [facility] in [place]      The number to contact patient is [phone]

## 2019-10-14 ENCOUNTER — Other Ambulatory Visit: Payer: Self-pay | Admitting: Physician Assistant

## 2019-10-14 MED ORDER — PREDNISONE 10 MG PO TABS
10.0000 mg | ORAL_TABLET | Freq: Every day | ORAL | 0 refills | Status: DC
Start: 2019-10-14 — End: 2019-12-15

## 2019-10-14 NOTE — Telephone Encounter (Signed)
Called pt to advise of message below. Voiced understanding and will call with questions.  

## 2019-10-14 NOTE — Telephone Encounter (Signed)
I did refill but she does need to try weaning off these going to 1 every other day for a week and then wean off. If she has the return of her symptoms she needs to follow up

## 2019-10-14 NOTE — Telephone Encounter (Signed)
Pt given rx for prednisone 09/09/19 LBP and right knee pain

## 2019-12-14 ENCOUNTER — Telehealth: Payer: Self-pay | Admitting: Orthopedic Surgery

## 2019-12-14 NOTE — Telephone Encounter (Signed)
Can you please call and make an appt pt has not been in office since september. Can see duda, maryanne or erin

## 2019-12-14 NOTE — Telephone Encounter (Signed)
Patient called requesting a refill of steriod pain medication. Please call patient because she is not sure which medication it is. Please send to pharmacy on file. Patient phone number is [phone].

## 2019-12-15 ENCOUNTER — Encounter: Payer: Self-pay | Admitting: Family

## 2019-12-15 ENCOUNTER — Ambulatory Visit (INDEPENDENT_AMBULATORY_CARE_PROVIDER_SITE_OTHER): Payer: No Typology Code available for payment source | Admitting: Family

## 2019-12-15 VITALS — Ht 64.0 in | Wt 204.0 lb

## 2019-12-15 DIAGNOSIS — G8929 Other chronic pain: Secondary | ICD-10-CM

## 2019-12-15 DIAGNOSIS — M5442 Lumbago with sciatica, left side: Secondary | ICD-10-CM | POA: Diagnosis not present

## 2019-12-15 MED ORDER — PREDNISONE 10 MG PO TABS
10.0000 mg | ORAL_TABLET | Freq: Every day | ORAL | 0 refills | Status: DC
Start: 2019-12-15 — End: 2020-01-25

## 2019-12-15 NOTE — Progress Notes (Signed)
Office Visit Note   Patient: Amber Church           Date of Birth: 04/04/1955           MRN: [id number] Visit Date: 12/15/2019              Requested by: Denita Lung, MD [address] [place],  [place] [address] PCP: Denita Lung, MD  Chief Complaint  Patient presents with  . Lower Back - Follow-up      HPI: This is a pleasant 64 year old woman who comes in today for low back pain this is bilateral she does have some shooting pain down the left buttock thigh and lateral aspect of her left thigh.  No numbness does endorse tingling and burning.  This is made worse by weightbearing.  She has not had any recent injuries she has had good relief with prednisone in the past has been taking 10 mg daily with breakfast and was able to deal with her level of pain some months ago.  She has had several ESI's with Dr. Ernestina Patches in the past some have provided her with quite prolonged relief unfortunately the most recent injection she had did not provide her with any relief.  States Dr. Ernestina Patches recommended she return to Dr. Sharol Given for evaluation.  Discussed the possibility of sending her to spine surgery as well.   Assessment & Plan: Visit Diagnoses:  No diagnosis found.  Plan: We will refill her prednisone.  The patient would like to see how she does with a prednisone course prior to a referral to spine surgery she will follow-up in the office as needed  Follow-Up Instructions: No follow-ups on file.   Ortho Exam  Patient is alert, oriented, no adenopathy, well-dressed, normal affect, normal respiratory effort. Focused examination of her low back pain she has no spinous process tenderness.  She does have some paraspinal tenderness bilaterally.  The hip and proximal femur are nontender no pain with range of motion of the hip.  Does have reproduction of her symptoms with straight leg raise on the left.  No distal weakness. excellent plantar flexion strength. Sensation is  intact.  Imaging: No results found. No images are attached to the encounter.  Labs: No results found for: HGBA1C, ESRSEDRATE, CRP, LABURIC, REPTSTATUS, GRAMSTAIN, CULT, LABORGA   Lab Results  Component Value Date   ALBUMIN 4.5 06/10/2019   ALBUMIN 3.8 09/25/2018   ALBUMIN 4.3 08/18/2018    No results found for: MG No results found for: VD25OH  No results found for: PREALBUMIN CBC EXTENDED Latest Ref Rng & Units 09/25/2018 07/16/2018 06/03/2017  WBC 4.0 - 10.5 K/uL 9.5 9.1 7.5  RBC 3.87 - 5.11 MIL/uL 4.15 4.28 4.47  HGB 12.0 - 15.0 g/dL 12.9 12.8 13.6  HCT 36 - 46 % 38.4 38.2 38.9  PLT 150 - 400 K/uL 345 325 336  NEUTROABS 1.40 - 7.00 x10E3/uL - 5.9 4.0  LYMPHSABS 0 - 3 x10E3/uL - 2.1 2.6     Body mass index is 35.02 kg/m.  Orders:  No orders of the defined types were placed in this encounter.  No orders of the defined types were placed in this encounter.    Procedures: No procedures performed  Clinical Data: No additional findings.  ROS:  All other systems negative, except as noted in the HPI. Review of Systems  Constitutional: Negative for chills and fever.  Musculoskeletal: Positive for back pain and myalgias.  Neurological: Negative for weakness and numbness.  Objective: Vital Signs: Ht 5\' 4"  (1.626 m)   Wt 204 lb (92.5 kg)   BMI 35.02 kg/m   Specialty Comments:  No specialty comments available.  PMFS History: Patient Active Problem List   Diagnosis Date Noted  . S/P TKR (total knee replacement), right 10/02/2018  . Unilateral primary osteoarthritis, right knee   . Achilles tendonitis, bilateral 05/13/2016  . Achilles tendon contracture, bilateral 05/13/2016  . Hypertension 08/06/2010  . Hyperlipidemia LDL goal <100 08/06/2010  . Obesity (BMI 30-39.9) 08/06/2010   Past Medical History:  Diagnosis Date  . Abnormal EKG   . Abnormal pap    s/p cryotherapy  . Anxiety   . Arthritis   . Depression   . Diverticulosis   . GERD  (gastroesophageal reflux disease)   . Hyperlipidemia   . Hypertension   . S/P colonoscopic polypectomy     Family History  Problem Relation Age of Onset  . Alzheimer's disease Mother   . Hyperlipidemia Mother   . Other Father        died of gunshot wound  . Osteoporosis Sister   . Osteoporosis Sister   . ALS Maternal Aunt   . ALS Maternal Uncle   . Diabetes Paternal Aunt   . Cancer Maternal Grandfather        lung cancer  . Cancer Paternal Grandmother        ?type  . Heart disease Neg Hx     Past Surgical History:  Procedure Laterality Date  . COLONOSCOPY  03/18/11; 2000   Dr. Amedeo Plenty; diverticulosis in sigmoid colon  . crysurgery  age 73   for abnormal pap  . LAMINECTOMY  03/2002  . TOTAL KNEE ARTHROPLASTY Right 10/02/2018  . TOTAL KNEE ARTHROPLASTY Right 10/02/2018   Procedure: RIGHT TOTAL KNEE ARTHROPLASTY;  Surgeon: Newt Minion, MD;  Location: [facility];  Service: Orthopedics;  Laterality: Right;   Social History   Occupational History  . Occupation: quality control    Employer: [employer]  Tobacco Use  . Smoking status: Never Smoker  . Smokeless tobacco: Never Used  Vaping Use  . Vaping Use: Never used  Substance and Sexual Activity  . Alcohol use: Yes    Comment: glass of wine 1-2 times per week.  . Drug use: No  . Sexual activity: Yes    Partners: Male    Comment: postmenopausal

## 2020-01-25 ENCOUNTER — Other Ambulatory Visit: Payer: Self-pay | Admitting: Physician Assistant

## 2020-01-25 ENCOUNTER — Telehealth: Payer: Self-pay | Admitting: Physician Assistant

## 2020-01-25 MED ORDER — PREDNISONE 10 MG PO TABS
10.0000 mg | ORAL_TABLET | Freq: Every day | ORAL | 0 refills | Status: DC
Start: 2020-01-25 — End: 2020-09-19

## 2020-01-25 NOTE — Telephone Encounter (Signed)
Refilled for 30. If continuing difficulties Amber Church had said to refer to spine surgery  as ESI wasn't helping either

## 2020-01-25 NOTE — Telephone Encounter (Signed)
12/15/19 pt is office treated for LBP requesting refill on prednisone. Please advise.

## 2020-01-25 NOTE — Telephone Encounter (Signed)
Pt aware of message below

## 2020-01-25 NOTE — Telephone Encounter (Signed)
Patient called requesting a refill of prednisone. Please send to pharmacy on file. Patient phone number is [phone].

## 2020-03-07 ENCOUNTER — Telehealth: Payer: Self-pay | Admitting: Orthopedic Surgery

## 2020-03-07 NOTE — Telephone Encounter (Signed)
Pt would like to know if she can get 800 mg ibuprofen and would like to know where she is being referred Cb [phone].

## 2020-03-07 NOTE — Telephone Encounter (Signed)
You saw this pt in 12/2019 and had advised if failure of prednisone would refer to spinal surgery. Please see pt below and advise.

## 2020-03-14 ENCOUNTER — Telehealth: Payer: Self-pay | Admitting: Family

## 2020-03-14 MED ORDER — IBUPROFEN 800 MG PO TABS: 800.0000 mg | ORAL_TABLET | Freq: Three times a day (TID) | ORAL | 0 refills | Status: DC | PRN

## 2020-03-14 NOTE — Telephone Encounter (Signed)
Would you offer appt with nitka or yates

## 2020-03-14 NOTE — Addendum Note (Signed)
Addended by: Suzan Slick on: 03/14/2020 08:28 AM   Modules accepted: Orders

## 2020-03-14 NOTE — Telephone Encounter (Signed)
Called  Patient left voicemail message to return call to schedule an appointment with Dr Lorin Mercy or Dr Louanne Skye per Junie Panning

## 2020-04-04 ENCOUNTER — Ambulatory Visit (INDEPENDENT_AMBULATORY_CARE_PROVIDER_SITE_OTHER): Payer: 59

## 2020-04-04 ENCOUNTER — Encounter: Payer: Self-pay | Admitting: Orthopaedic Surgery

## 2020-04-04 ENCOUNTER — Other Ambulatory Visit: Payer: Self-pay

## 2020-04-04 ENCOUNTER — Ambulatory Visit: Payer: 59 | Admitting: Orthopaedic Surgery

## 2020-04-04 VITALS — BP 152/104 | HR 88 | Ht 64.0 in | Wt 210.0 lb

## 2020-04-04 DIAGNOSIS — G8929 Other chronic pain: Secondary | ICD-10-CM

## 2020-04-04 DIAGNOSIS — M5442 Lumbago with sciatica, left side: Secondary | ICD-10-CM

## 2020-04-04 DIAGNOSIS — M48062 Spinal stenosis, lumbar region with neurogenic claudication: Secondary | ICD-10-CM

## 2020-04-04 DIAGNOSIS — M48061 Spinal stenosis, lumbar region without neurogenic claudication: Secondary | ICD-10-CM | POA: Insufficient documentation

## 2020-04-04 NOTE — Progress Notes (Signed)
Office Visit Note   Patient: Amber Church           Date of Birth: [birth year]           MRN: [id number] Visit Date: 04/04/2020              Requested by: Denita Lung, MD [address] [place],  [place] [address] PCP: Denita Lung, MD   Assessment & Plan: Visit Diagnoses:  1. Chronic left-sided low back pain with left-sided sciatica     Plan: We discussed options with repeat scanning and discussed surgical treatment for spinal stenosis if she has had progression from 2020.  We reviewed x-ray results with her with anterolisthesis.  She does not think her symptoms are severe enough to consider repeat scanning or possibly surgery.  I will recheck her in 6 months.  Follow-Up Instructions: No follow-ups on file.   Orders:  Orders Placed This Encounter  Procedures  . XR Lumbar Spine Complete   No orders of the defined types were placed in this encounter.     Procedures: No procedures performed   Clinical Data: No additional findings.   Subjective: Chief Complaint  Patient presents with  . Lower Back - Pain    HPI 65 year old female with persistent low back pain buttocks pain that shoots into her left thigh laterally into the lateral aspect.  Patient been on steroids got good relief with increase in symptoms after 8 days.  Patient is used ibuprofen, Tylenol arthritis.  Previous epidural did not give her a lot of relief.  She has had increased pain with walking particular with her sister it bothers her at night.  She gets relief with sitting.  Does better leaning on a grocery cart.  MRI scan March 2020 showed moderate spinal stenosis at L4-5.  She had some mild facet arthropathy and mild narrowing at other levels.  Review of Systems All other systems noncontributory to HPI.  Objective: Vital Signs: BP (!) 152/104   Pulse 88   Ht 5\' 4"  (1.626 m)   Wt 210 lb (95.3 kg)   BMI 36.05 kg/m   Physical Exam Constitutional:      Appearance: She is  well-developed.  HENT:     Head: Normocephalic.     Right Ear: External ear normal.     Left Ear: External ear normal.  Eyes:     Pupils: Pupils are equal, round, and reactive to light.  Neck:     Thyroid: No thyromegaly.     Trachea: No tracheal deviation.  Cardiovascular:     Rate and Rhythm: Normal rate.  Pulmonary:     Effort: Pulmonary effort is normal.  Abdominal:     Palpations: Abdomen is soft.  Skin:    General: Skin is warm and dry.  Neurological:     Mental Status: She is alert and oriented to person, place, and time.  Psychiatric:        Behavior: Behavior normal.     Ortho Exam patient has negative straight leg raising 90 degrees negative logroll the hips.  Knee and ankle jerk are intact anterior tib gastrocsoleus is intact.  She can heel and toe walk.  Abductors are strong.  Specialty Comments:  No specialty comments available.  Imaging: No results found.   PMFS History: Patient Active Problem List   Diagnosis Date Noted  . S/P TKR (total knee replacement), right 10/02/2018  . Unilateral primary osteoarthritis, right knee   . Achilles tendonitis, bilateral 05/13/2016  .  Achilles tendon contracture, bilateral 05/13/2016  . Hypertension 08/06/2010  . Hyperlipidemia LDL goal <100 08/06/2010  . Obesity (BMI 30-39.9) 08/06/2010   Past Medical History:  Diagnosis Date  . Abnormal EKG   . Abnormal pap    s/p cryotherapy  . Anxiety   . Arthritis   . Depression   . Diverticulosis   . GERD (gastroesophageal reflux disease)   . Hyperlipidemia   . Hypertension   . S/P colonoscopic polypectomy     Family History  Problem Relation Age of Onset  . Alzheimer's disease Mother   . Hyperlipidemia Mother   . Other Father        died of gunshot wound  . Osteoporosis Sister   . Osteoporosis Sister   . ALS Maternal Aunt   . ALS Maternal Uncle   . Diabetes Paternal Aunt   . Cancer Maternal Grandfather        lung cancer  . Cancer Paternal Grandmother         ?type  . Heart disease Neg Hx     Past Surgical History:  Procedure Laterality Date  . COLONOSCOPY  03/18/11; 2000   Dr. Amedeo Plenty; diverticulosis in sigmoid colon  . crysurgery  age 54   for abnormal pap  . LAMINECTOMY  03/2002  . TOTAL KNEE ARTHROPLASTY Right 10/02/2018  . TOTAL KNEE ARTHROPLASTY Right 10/02/2018   Procedure: RIGHT TOTAL KNEE ARTHROPLASTY;  Surgeon: Newt Minion, MD;  Location: [facility];  Service: Orthopedics;  Laterality: Right;   Social History   Occupational History  . Occupation: quality control    Employer: [employer]  Tobacco Use  . Smoking status: Never Smoker  . Smokeless tobacco: Never Used  Vaping Use  . Vaping Use: Never used  Substance and Sexual Activity  . Alcohol use: Yes    Comment: glass of wine 1-2 times per week.  . Drug use: No  . Sexual activity: Yes    Partners: Male    Comment: postmenopausal

## 2020-05-10 ENCOUNTER — Telehealth: Payer: Self-pay

## 2020-05-10 DIAGNOSIS — E785 Hyperlipidemia, unspecified: Secondary | ICD-10-CM

## 2020-05-10 DIAGNOSIS — I10 Essential (primary) hypertension: Secondary | ICD-10-CM

## 2020-05-10 MED ORDER — LISINOPRIL-HYDROCHLOROTHIAZIDE 10-12.5 MG PO TABS: 1.0000 | ORAL_TABLET | Freq: Every day | ORAL | 3 refills | Status: DC

## 2020-05-10 MED ORDER — DICLOFENAC SODIUM 1 % EX GEL: 2.0000 g | Freq: Four times a day (QID) | CUTANEOUS | 1 refills | Status: DC

## 2020-05-10 MED ORDER — PRAVASTATIN SODIUM 40 MG PO TABS: 40.0000 mg | ORAL_TABLET | Freq: Every day | ORAL | 3 refills | Status: DC

## 2020-05-10 NOTE — Telephone Encounter (Signed)
Pt. Called LM stating she needs refill on her pravastatin, lisinopril/hctz, and volteran gel 1% to [facility] in [place]. Pt. Last apt was 06/10/19.

## 2020-05-19 ENCOUNTER — Ambulatory Visit (INDEPENDENT_AMBULATORY_CARE_PROVIDER_SITE_OTHER): Payer: 59 | Admitting: Physician Assistant

## 2020-05-19 ENCOUNTER — Ambulatory Visit: Payer: Self-pay

## 2020-05-19 ENCOUNTER — Encounter: Payer: Self-pay | Admitting: Physician Assistant

## 2020-05-19 ENCOUNTER — Telehealth: Payer: Self-pay

## 2020-05-19 ENCOUNTER — Ambulatory Visit (INDEPENDENT_AMBULATORY_CARE_PROVIDER_SITE_OTHER): Payer: 59

## 2020-05-19 DIAGNOSIS — G8929 Other chronic pain: Secondary | ICD-10-CM

## 2020-05-19 DIAGNOSIS — M25562 Pain in left knee: Secondary | ICD-10-CM

## 2020-05-19 DIAGNOSIS — M25561 Pain in right knee: Secondary | ICD-10-CM | POA: Diagnosis not present

## 2020-05-19 NOTE — Progress Notes (Signed)
Office Visit Note   Patient: Amber Church           Date of Birth: 1955-03-28           MRN: [id number] Visit Date: 05/19/2020              Requested by: Denita Lung, MD [address] [place],  [place] [address] PCP: Denita Lung, MD  Chief Complaint  Patient presents with  . Right Knee - Pain  . Left Knee - Pain      HPI: Presents with a chief complaint of bilateral knee pain right greater than left.  She is status post right knee arthroplasty 2 years ago by Dr. Sharol Given.  She denies any recent injury.  She just feels like when she her knee relaxes at night that it wakes her up.  Does not really bother her much when she is ambulating.  On the left knee she has pain especially over the inside of her knee she denies any injury but knows she has arthritis  Assessment & Plan: Visit Diagnoses:  1. Chronic pain of both knees     Plan: She will begin quadricep strengthening on the right.  On the left side we will go forward with an injection today follow-up in 1 month  Follow-Up Instructions: No follow-ups on file.   Ortho Exam  Patient is alert, oriented, no adenopathy, well-dressed, normal affect, normal respiratory effort. Right knee: No effusion no erythema no cellulitis.  She does have some noted quadricep atrophy.  She has some tenderness around the patella On the left knee mild soft tissue swelling but no effusion no cellulitis no erythema.  She is focally tender over the medial joint line she does have significant crepitus with range of motion  Imaging: XR KNEE 3 VIEW LEFT  Result Date: 05/19/2020 2 views of her left knee demonstrate varus malalignment with medial compartment advanced arthritic changes  XR KNEE 3 VIEW RIGHT  Result Date: 05/19/2020 2 views of her right knee demonstrate findings consistent status post total knee arthroplasty components appear healthy and in good condition no acute osseous changes  No images are attached to the  encounter.  Labs: No results found for: HGBA1C, ESRSEDRATE, CRP, LABURIC, REPTSTATUS, GRAMSTAIN, CULT, LABORGA   Lab Results  Component Value Date   ALBUMIN 4.5 06/10/2019   ALBUMIN 3.8 09/25/2018   ALBUMIN 4.3 08/18/2018    No results found for: MG No results found for: VD25OH  No results found for: PREALBUMIN CBC EXTENDED Latest Ref Rng & Units 09/25/2018 07/16/2018 06/03/2017  WBC 4.0 - 10.5 K/uL 9.5 9.1 7.5  RBC 3.87 - 5.11 MIL/uL 4.15 4.28 4.47  HGB 12.0 - 15.0 g/dL 12.9 12.8 13.6  HCT 36.0 - 46.0 % 38.4 38.2 38.9  PLT 150 - 400 K/uL 345 325 336  NEUTROABS 1.4 - 7.0 x10E3/uL - 5.9 4.0  LYMPHSABS 0.7 - 3.1 x10E3/uL - 2.1 2.6     There is no height or weight on file to calculate BMI.  Orders:  Orders Placed This Encounter  Procedures  . XR KNEE 3 VIEW LEFT  . XR KNEE 3 VIEW RIGHT   No orders of the defined types were placed in this encounter.    Procedures: No procedures performed  Clinical Data: No additional findings.  ROS:  All other systems negative, except as noted in the HPI. Review of Systems  Objective: Vital Signs: There were no vitals taken for this visit.  Specialty Comments:  No specialty comments available.  PMFS History: Patient Active Problem List   Diagnosis Date Noted  . Spinal stenosis of lumbar region 04/04/2020  . S/P TKR (total knee replacement), right 10/02/2018  . Unilateral primary osteoarthritis, right knee   . Achilles tendonitis, bilateral 05/13/2016  . Achilles tendon contracture, bilateral 05/13/2016  . Hypertension 08/06/2010  . Hyperlipidemia LDL goal <100 08/06/2010  . Obesity (BMI 30-39.9) 08/06/2010   Past Medical History:  Diagnosis Date  . Abnormal EKG   . Abnormal pap    s/p cryotherapy  . Anxiety   . Arthritis   . Depression   . Diverticulosis   . GERD (gastroesophageal reflux disease)   . Hyperlipidemia   . Hypertension   . S/P colonoscopic polypectomy     Family History  Problem Relation Age of  Onset  . Alzheimer's disease Mother   . Hyperlipidemia Mother   . Other Father        died of gunshot wound  . Osteoporosis Sister   . Osteoporosis Sister   . ALS Maternal Aunt   . ALS Maternal Uncle   . Diabetes Paternal Aunt   . Cancer Maternal Grandfather        lung cancer  . Cancer Paternal Grandmother        ?type  . Heart disease Neg Hx     Past Surgical History:  Procedure Laterality Date  . COLONOSCOPY  03/18/11; 2000   Dr. Amedeo Plenty; diverticulosis in sigmoid colon  . crysurgery  age 79   for abnormal pap  . LAMINECTOMY  03/2002  . TOTAL KNEE ARTHROPLASTY Right 10/02/2018  . TOTAL KNEE ARTHROPLASTY Right 10/02/2018   Procedure: RIGHT TOTAL KNEE ARTHROPLASTY;  Surgeon: Newt Minion, MD;  Location: [facility];  Service: Orthopedics;  Laterality: Right;   Social History   Occupational History  . Occupation: quality control    Employer: [employer]  Tobacco Use  . Smoking status: Never Smoker  . Smokeless tobacco: Never Used  Vaping Use  . Vaping Use: Never used  Substance and Sexual Activity  . Alcohol use: Yes    Comment: glass of wine 1-2 times per week.  . Drug use: No  . Sexual activity: Yes    Partners: Male    Comment: postmenopausal

## 2020-05-19 NOTE — Telephone Encounter (Signed)
Patient called she is requesting a rx refill for ibuprofen called into [facility] in [place] call back:[phone]

## 2020-05-22 MED ORDER — IBUPROFEN 800 MG PO TABS: 800.0000 mg | ORAL_TABLET | Freq: Three times a day (TID) | ORAL | 0 refills | Status: DC | PRN

## 2020-05-22 NOTE — Addendum Note (Signed)
Addended by: Dondra Prader R on: 05/22/2020 10:45 AM   Modules accepted: Orders

## 2020-06-16 ENCOUNTER — Encounter: Payer: Self-pay | Admitting: Physician Assistant

## 2020-06-16 ENCOUNTER — Other Ambulatory Visit: Payer: Self-pay

## 2020-06-16 ENCOUNTER — Ambulatory Visit (INDEPENDENT_AMBULATORY_CARE_PROVIDER_SITE_OTHER): Payer: 59 | Admitting: Physician Assistant

## 2020-06-16 DIAGNOSIS — M25562 Pain in left knee: Secondary | ICD-10-CM | POA: Diagnosis not present

## 2020-06-16 DIAGNOSIS — M25561 Pain in right knee: Secondary | ICD-10-CM | POA: Diagnosis not present

## 2020-06-16 NOTE — Progress Notes (Signed)
Office Visit Note   Patient: Amber Church           Date of Birth: 1955/04/17           MRN: [id number] Visit Date: 06/16/2020              Requested by: Denita Lung, MD [address] [place],  [place] [address] PCP: Denita Lung, MD  Chief Complaint  Patient presents with   Left Knee - Pain   Right Knee - Pain      HPI: Patient presents in follow-up today for her knees.  She is status post right knee total arthroplasty.  At her last visit she was having patellofemoral symptoms.  She has been doing exercises and feels it has improved significantly.  With regards to her left knee I did inject her a month ago.  She said this significantly helped for a few weeks.  Wondering if she can have 1 more injection today  Assessment & Plan: Visit Diagnoses: No diagnosis found.  Plan: She will continue doing the exercises with both legs.  I have given her 1 more injection but she understands she cannot have another one for a few months.  She could consider gel injections  Follow-Up Instructions: No follow-ups on file.   Ortho Exam  Patient is alert, oriented, no adenopathy, well-dressed, normal affect, normal respiratory effort. Right lower extremity: Well-healed surgical incision excellent quadriceps strength no signs of infection or concern Left lower extremit: No effusion no cellulitis no erythema.  Tenderness over the medial joint line  Imaging: No results found. No images are attached to the encounter.  Labs: No results found for: HGBA1C, ESRSEDRATE, CRP, LABURIC, REPTSTATUS, GRAMSTAIN, CULT, LABORGA   Lab Results  Component Value Date   ALBUMIN 4.5 06/10/2019   ALBUMIN 3.8 09/25/2018   ALBUMIN 4.3 08/18/2018    No results found for: MG No results found for: VD25OH  No results found for: PREALBUMIN CBC EXTENDED Latest Ref Rng & Units 09/25/2018 07/16/2018 06/03/2017  WBC 4.0 - 10.5 K/uL 9.5 9.1 7.5  RBC 3.87 - 5.11 MIL/uL 4.15 4.28 4.47  HGB 12.0 -  15.0 g/dL 12.9 12.8 13.6  HCT 36.0 - 46.0 % 38.4 38.2 38.9  PLT 150 - 400 K/uL 345 325 336  NEUTROABS 1.4 - 7.0 x10E3/uL - 5.9 4.0  LYMPHSABS 0.7 - 3.1 x10E3/uL - 2.1 2.6     There is no height or weight on file to calculate BMI.  Orders:  No orders of the defined types were placed in this encounter.  No orders of the defined types were placed in this encounter.    Procedures: No procedures performed  Clinical Data: No additional findings.  ROS:  All other systems negative, except as noted in the HPI. Review of Systems  Objective: Vital Signs: There were no vitals taken for this visit.  Specialty Comments:  No specialty comments available.  PMFS History: Patient Active Problem List   Diagnosis Date Noted   Spinal stenosis of lumbar region 04/04/2020   S/P TKR (total knee replacement), right 10/02/2018   Unilateral primary osteoarthritis, right knee    Achilles tendonitis, bilateral 05/13/2016   Achilles tendon contracture, bilateral 05/13/2016   Hypertension 08/06/2010   Hyperlipidemia LDL goal <100 08/06/2010   Obesity (BMI 30-39.9) 08/06/2010   Past Medical History:  Diagnosis Date   Abnormal EKG    Abnormal pap    s/p cryotherapy   Anxiety    Arthritis  Depression    Diverticulosis    GERD (gastroesophageal reflux disease)    Hyperlipidemia    Hypertension    S/P colonoscopic polypectomy     Family History  Problem Relation Age of Onset   Alzheimer's disease Mother    Hyperlipidemia Mother    Other Father        died of gunshot wound   Osteoporosis Sister    Osteoporosis Sister    ALS Maternal Aunt    ALS Maternal Uncle    Diabetes Paternal Aunt    Cancer Maternal Grandfather        lung cancer   Cancer Paternal Grandmother        ?type   Heart disease Neg Hx     Past Surgical History:  Procedure Laterality Date   COLONOSCOPY  03/18/11; 2000   Dr. Amedeo Plenty; diverticulosis in sigmoid colon   crysurgery  age 72   for abnormal pap    LAMINECTOMY  03/2002   TOTAL KNEE ARTHROPLASTY Right 10/02/2018   TOTAL KNEE ARTHROPLASTY Right 10/02/2018   Procedure: RIGHT TOTAL KNEE ARTHROPLASTY;  Surgeon: Newt Minion, MD;  Location: [facility];  Service: Orthopedics;  Laterality: Right;   Social History   Occupational History   Occupation: Fish farm manager: [employer]  Tobacco Use   Smoking status: Never   Smokeless tobacco: Never  Vaping Use   Vaping Use: Never used  Substance and Sexual Activity   Alcohol use: Yes    Comment: glass of wine 1-2 times per week.   Drug use: No   Sexual activity: Yes    Partners: Male    Comment: postmenopausal

## 2020-08-18 ENCOUNTER — Other Ambulatory Visit: Payer: Self-pay

## 2020-08-18 ENCOUNTER — Ambulatory Visit (INDEPENDENT_AMBULATORY_CARE_PROVIDER_SITE_OTHER): Payer: 59 | Admitting: Family Medicine

## 2020-08-18 ENCOUNTER — Telehealth: Payer: Self-pay | Admitting: Internal Medicine

## 2020-08-18 VITALS — BP 124/86 | HR 86 | Temp 97.0°F | Ht 63.25 in | Wt 204.0 lb

## 2020-08-18 DIAGNOSIS — E785 Hyperlipidemia, unspecified: Secondary | ICD-10-CM

## 2020-08-18 DIAGNOSIS — I10 Essential (primary) hypertension: Secondary | ICD-10-CM

## 2020-08-18 DIAGNOSIS — Z0000 Encounter for general adult medical examination without abnormal findings: Secondary | ICD-10-CM | POA: Diagnosis not present

## 2020-08-18 DIAGNOSIS — M48062 Spinal stenosis, lumbar region with neurogenic claudication: Secondary | ICD-10-CM

## 2020-08-18 DIAGNOSIS — Z96651 Presence of right artificial knee joint: Secondary | ICD-10-CM

## 2020-08-18 DIAGNOSIS — Z1211 Encounter for screening for malignant neoplasm of colon: Secondary | ICD-10-CM

## 2020-08-18 DIAGNOSIS — Z1231 Encounter for screening mammogram for malignant neoplasm of breast: Secondary | ICD-10-CM

## 2020-08-18 DIAGNOSIS — E669 Obesity, unspecified: Secondary | ICD-10-CM | POA: Diagnosis not present

## 2020-08-18 DIAGNOSIS — Z23 Encounter for immunization: Secondary | ICD-10-CM | POA: Diagnosis not present

## 2020-08-18 DIAGNOSIS — M1712 Unilateral primary osteoarthritis, left knee: Secondary | ICD-10-CM

## 2020-08-18 DIAGNOSIS — M19049 Primary osteoarthritis, unspecified hand: Secondary | ICD-10-CM

## 2020-08-18 MED ORDER — PRAVASTATIN SODIUM 40 MG PO TABS: 40.0000 mg | ORAL_TABLET | Freq: Every day | ORAL | 3 refills | Status: DC

## 2020-08-18 MED ORDER — TRAZODONE HCL 50 MG PO TABS: 50.0000 mg | ORAL_TABLET | Freq: Every day | ORAL | 0 refills | Status: DC

## 2020-08-18 MED ORDER — LISINOPRIL-HYDROCHLOROTHIAZIDE 10-12.5 MG PO TABS: 1.0000 | ORAL_TABLET | Freq: Every day | ORAL | 3 refills | Status: DC

## 2020-08-18 NOTE — Telephone Encounter (Signed)
Pt called and states that the meds and sleep med that were sent in today needs to go to [facility] in [place] not [facility]

## 2020-08-18 NOTE — Progress Notes (Signed)
   Subjective:    Patient ID: Amber Church, female    DOB: Mar 27, 1955, 65 y.o.   MRN: [id number]  HPI She is here for complete examination.  She is having more difficulty with left knee pain from arthritis.  She has had a TKR in her right and will eventually need 1 on the left.  She also complains of difficulty with spinal stenosis and has seen orthopedics for that as well.  She is considering surgery for that.  She does complain of some arthritis in her hands and plans to use Voltaren cream on that which apparently has helped in the past.  She also has recently seen occasional bouts of blood in her stool.  Her last colonoscopy was 9 years ago.  She does need a Pap but wants to defer that.  She also is having some slight difficulty with sleep disturbance and would like to be given the does arrive again.  She is retired.  She is helping take care of her 34-monthold grandchild.  She and her husband are getting along quite nicely.  Family and social history as well as health maintenance and immunizations was reviewed.   Review of Systems  All other systems reviewed and are negative.     Objective:   Physical Exam Alert and in no distress. Tympanic membranes and canals are normal. Pharyngeal area is normal. Neck is supple without adenopathy or thyromegaly. Cardiac exam shows a regular sinus rhythm without murmurs or gallops. Lungs are clear to auscultation.        Assessment & Plan:  Routine general medical examination at a health care facility - Plan: CBC with Differential/Platelet, Comprehensive metabolic panel  Hyperlipidemia LDL goal <100 - Plan: Lipid panel, pravastatin (PRAVACHOL) 40 MG tablet  Status post total right knee replacement  Spinal stenosis of lumbar region with neurogenic claudication  Obesity (BMI 30-39.9)  Primary hypertension - Plan: CBC with Differential/Platelet, Comprehensive metabolic panel, lisinopril-hydrochlorothiazide (ZESTORETIC) 10-12.5 MG  tablet  Arthritis of left knee  Arthritis of hand  Need for Tdap vaccination - Plan: Tdap vaccine greater than or equal to 7yo IM  Encounter for screening mammogram for malignant neoplasm of breast - Plan: MM Digital Screening  Screening for colon cancer - Plan: Ambulatory referral to Gastroenterology Think it is reasonable to go ahead and set her up for colonoscopy.  She will continue on her present medication regimen.  Encourage her to become more physically active.  Complemented her on taking care of her grandchild.  Discussed the arthritis she is having and she has a good handle on how to handle this. At the end of the encounter and discussed the finances she has arranged with her daughter concerning a free place to live as well as free babysitting services.  Strongly encouraged her to sit down with the daughter and son-in-law to discuss finances to ensure that they are indeed putting money into savings especially since free place to live and free babysitting. I will also renew the does well to help with sleeping.  Encouraged to use it on an as-needed basis.

## 2020-08-18 NOTE — Telephone Encounter (Signed)
Error

## 2020-08-19 LAB — CBC WITH DIFFERENTIAL/PLATELET
Basophils Absolute: 0 10*3/uL (ref 0.0–0.2)
Basos: 1 %
EOS (ABSOLUTE): 0.2 10*3/uL (ref 0.0–0.4)
Eos: 2 %
Hematocrit: 40 % (ref 34.0–46.6)
Hemoglobin: 13.6 g/dL (ref 11.1–15.9)
Immature Grans (Abs): 0 10*3/uL (ref 0.0–0.1)
Immature Granulocytes: 0 %
Lymphocytes Absolute: 2.8 10*3/uL (ref 0.7–3.1)
Lymphs: 36 %
MCH: 29.3 pg (ref 26.6–33.0)
MCHC: 34 g/dL (ref 31.5–35.7)
MCV: 86 fL (ref 79–97)
Monocytes Absolute: 0.5 10*3/uL (ref 0.1–0.9)
Monocytes: 7 %
Neutrophils Absolute: 4.1 10*3/uL (ref 1.4–7.0)
Neutrophils: 54 %
Platelets: 385 10*3/uL (ref 150–450)
RBC: 4.64 x10E6/uL (ref 3.77–5.28)
RDW: 13 % (ref 11.7–15.4)
WBC: 7.7 10*3/uL (ref 3.4–10.8)

## 2020-08-19 LAB — COMPREHENSIVE METABOLIC PANEL
ALT: 17 IU/L (ref 0–32)
AST: 20 IU/L (ref 0–40)
Albumin/Globulin Ratio: 1.7 (ref 1.2–2.2)
Albumin: 4.6 g/dL (ref 3.8–4.8)
Alkaline Phosphatase: 83 IU/L (ref 44–121)
BUN/Creatinine Ratio: 13 (ref 12–28)
BUN: 11 mg/dL (ref 8–27)
Bilirubin Total: 0.5 mg/dL (ref 0.0–1.2)
CO2: 23 mmol/L (ref 20–29)
Calcium: 9.7 mg/dL (ref 8.7–10.3)
Chloride: 103 mmol/L (ref 96–106)
Creatinine, Ser: 0.84 mg/dL (ref 0.57–1.00)
Globulin, Total: 2.7 g/dL (ref 1.5–4.5)
Glucose: 84 mg/dL (ref 65–99)
Potassium: 4.1 mmol/L (ref 3.5–5.2)
Sodium: 142 mmol/L (ref 134–144)
Total Protein: 7.3 g/dL (ref 6.0–8.5)
eGFR: 78 mL/min/{1.73_m2} (ref >59)

## 2020-08-19 LAB — LIPID PANEL
Chol/HDL Ratio: 3.3 ratio (ref 0.0–4.4)
Cholesterol, Total: 216 mg/dL — ABNORMAL HIGH (ref 100–199)
HDL: 66 mg/dL (ref >39)
LDL Chol Calc (NIH): 130 mg/dL — ABNORMAL HIGH (ref 0–99)
Triglycerides: 114 mg/dL (ref 0–149)
VLDL Cholesterol Cal: 20 mg/dL (ref 5–40)

## 2020-08-19 NOTE — Progress Notes (Signed)
The blood work is normal 

## 2020-08-23 ENCOUNTER — Telehealth: Payer: Self-pay | Admitting: Physical Medicine and Rehabilitation

## 2020-08-23 NOTE — Telephone Encounter (Signed)
Pt called to schedule an appt with Newton. Last seen 08/28/19.   CB [phone]

## 2020-08-23 NOTE — Telephone Encounter (Signed)
Bilateral L4-5 facets on 08/19/19. Ok to repeat if helped, same problem/side, and no new injury?

## 2020-08-24 NOTE — Telephone Encounter (Signed)
Patient wanted to schedule with Dr. Lorin Mercy in [place] and cancel her appointment here with him on 9/28. I gave her the number for the [facility] office and canceled the appointment on 9/28.

## 2020-08-25 ENCOUNTER — Telehealth: Payer: Self-pay | Admitting: Family Medicine

## 2020-08-25 NOTE — Telephone Encounter (Signed)
Pt does need a referral for [facility] to have her mammogram done.

## 2020-08-25 NOTE — Telephone Encounter (Signed)
Done KH 

## 2020-09-01 LAB — HM MAMMOGRAPHY

## 2020-09-07 ENCOUNTER — Ambulatory Visit (INDEPENDENT_AMBULATORY_CARE_PROVIDER_SITE_OTHER): Payer: 59 | Admitting: Orthopaedic Surgery

## 2020-09-07 ENCOUNTER — Encounter: Payer: Self-pay | Admitting: Orthopaedic Surgery

## 2020-09-07 ENCOUNTER — Other Ambulatory Visit: Payer: Self-pay

## 2020-09-07 VITALS — Ht 63.0 in | Wt 205.0 lb

## 2020-09-07 DIAGNOSIS — M48062 Spinal stenosis, lumbar region with neurogenic claudication: Secondary | ICD-10-CM | POA: Diagnosis not present

## 2020-09-07 DIAGNOSIS — G8929 Other chronic pain: Secondary | ICD-10-CM | POA: Diagnosis not present

## 2020-09-07 DIAGNOSIS — M5442 Lumbago with sciatica, left side: Secondary | ICD-10-CM | POA: Diagnosis not present

## 2020-09-07 NOTE — Progress Notes (Signed)
Office Visit Note   Patient: Amber Church           Date of Birth: [birth year]           MRN: [id number] Visit Date: 09/07/2020              Requested by: Denita Lung, MD [address] [place],  [place] [address] PCP: Denita Lung, MD   Assessment & Plan: Visit Diagnoses:  1. Chronic left-sided low back pain with left-sided sciatica   2. Spinal stenosis of lumbar region with neurogenic claudication     Plan: Patient like proceed with MRI scan.  She has noticed her distance she can ambulate and thinks she can stand is getting worse making more difficult her to function and ambulate in the community.  She like proceed with MRI scan to evaluate her progressive L4-5 spinal stenosis multifactorial.  Office follow-up after MRI scan for review.  Follow-Up Instructions: Return in about 3 weeks (around 09/28/2020).   Orders:  Orders Placed This Encounter  Procedures   MR Lumbar Spine w/o contrast   No orders of the defined types were placed in this encounter.     Procedures: No procedures performed   Clinical Data: No additional findings.   Subjective: Chief Complaint  Patient presents with   Lower Back - Pain    HPI 65 year old female returns with ongoing problems with back pain and claudication symptoms with ambulation.  She has back pain that radiates into buttocks cannot stand too long.  She thinks he could walk 1/4 mile.  Previous epidural without relief.  She is used Tylenol arthritis and ibuprofen in the past.  MRI scan 2020 in March showed moderate spinal stenosis at L4-5.  Patient thinks her symptoms are gradually worsening and thinks is now time to consider MRI.  She gets relief with sitting or supine position more than 5 minutes.  She has to lean over a grocery cart when she goes to the store.  She does not think she can make it through the store without the cart.  Review of Systems 14 point system update unchanged from 04/04/2020  visit.   Objective: Vital Signs: Ht '5\' 3"'$  (1.6 m)   Wt 205 lb (93 kg)   BMI 36.31 kg/m   Physical Exam Constitutional:      Appearance: She is well-developed.  HENT:     Head: Normocephalic.     Right Ear: External ear normal.     Left Ear: External ear normal. There is no impacted cerumen.  Eyes:     Pupils: Pupils are equal, round, and reactive to light.  Neck:     Thyroid: No thyromegaly.     Trachea: No tracheal deviation.  Cardiovascular:     Rate and Rhythm: Normal rate.  Pulmonary:     Effort: Pulmonary effort is normal.  Abdominal:     Palpations: Abdomen is soft.  Musculoskeletal:     Cervical back: No rigidity.  Skin:    General: Skin is warm and dry.  Neurological:     Mental Status: She is alert and oriented to person, place, and time.  Psychiatric:        Behavior: Behavior normal.    Ortho Exam well-healed right total knee arthroplasty good range of motion good quad strength no tenderness no effusion.  She has bilateral sciatic notch tenderness.  Some pain with straight leg raising at 90 degrees.  Anterior tib EHL is intact right and left.  Palpable  distal pulses.  She is able to heel and toe walk.  Negative logroll hips.  Specialty Comments:  No specialty comments available.  Imaging: No results found.   PMFS History: Patient Active Problem List   Diagnosis Date Noted   Spinal stenosis of lumbar region 04/04/2020   S/P TKR (total knee replacement), right 10/02/2018   Hypertension 08/06/2010   Hyperlipidemia LDL goal <100 08/06/2010   Obesity (BMI 30-39.9) 08/06/2010   Past Medical History:  Diagnosis Date   Abnormal EKG    Abnormal pap    s/p cryotherapy   Anxiety    Arthritis    Depression    Diverticulosis    GERD (gastroesophageal reflux disease)    Hyperlipidemia    Hypertension    S/P colonoscopic polypectomy     Family History  Problem Relation Age of Onset   Alzheimer's disease Mother    Hyperlipidemia Mother    Other  Father        died of gunshot wound   Osteoporosis Sister    Osteoporosis Sister    ALS Maternal Aunt    ALS Maternal Uncle    Diabetes Paternal Aunt    Cancer Maternal Grandfather        lung cancer   Cancer Paternal Grandmother        ?type   Heart disease Neg Hx     Past Surgical History:  Procedure Laterality Date   COLONOSCOPY  03/18/11; 2000   Dr. Amedeo Plenty; diverticulosis in sigmoid colon   crysurgery  age 89   for abnormal pap   LAMINECTOMY  03/2002   TOTAL KNEE ARTHROPLASTY Right 10/02/2018   TOTAL KNEE ARTHROPLASTY Right 10/02/2018   Procedure: RIGHT TOTAL KNEE ARTHROPLASTY;  Surgeon: Newt Minion, MD;  Location: [facility];  Service: Orthopedics;  Laterality: Right;   Social History   Occupational History   Occupation: Fish farm manager: [employer]  Tobacco Use   Smoking status: Never   Smokeless tobacco: Never  Vaping Use   Vaping Use: Never used  Substance and Sexual Activity   Alcohol use: Yes    Comment: glass of wine 1-2 times per week.   Drug use: No   Sexual activity: Yes    Partners: Male    Comment: postmenopausal

## 2020-09-08 ENCOUNTER — Encounter: Payer: Self-pay | Admitting: Family Medicine

## 2020-09-18 ENCOUNTER — Telehealth: Payer: Self-pay

## 2020-09-18 NOTE — Telephone Encounter (Signed)
Pt called stating that [facility] doesn't accept her insurance. So her referral for a Mri needs to be scheduled elsewhere.   Her insurance is [employer]

## 2020-09-19 ENCOUNTER — Other Ambulatory Visit: Payer: Self-pay

## 2020-09-19 ENCOUNTER — Encounter: Payer: Self-pay | Admitting: Family Medicine

## 2020-09-19 ENCOUNTER — Ambulatory Visit (INDEPENDENT_AMBULATORY_CARE_PROVIDER_SITE_OTHER): Payer: 59 | Admitting: Family Medicine

## 2020-09-19 VITALS — BP 120/86 | HR 83 | Temp 96.6°F | Wt 205.2 lb

## 2020-09-19 DIAGNOSIS — M48062 Spinal stenosis, lumbar region with neurogenic claudication: Secondary | ICD-10-CM | POA: Diagnosis not present

## 2020-09-19 NOTE — Progress Notes (Signed)
   Subjective:    Patient ID: Amber Church, female    DOB: 03-Oct-1955, 65 y.o.   MRN: [id number]  HPI She is here for consult concerning spinal stenosis.  She has seen Dr. Lorin Mercy and they are in the process of getting an MRI on her back.  She has concerns about the surgery.  Apparently Dr. Carloyn Manner did her previous back surgery.   Review of Systems     Objective:   Physical Exam Alert and in no distress otherwise not examined       Assessment & Plan:  Spinal stenosis of lumbar region with neurogenic claudication I explained that an MRI will need to be done before any further intervention can be accomplished.  I reassured her that Dr. Lorin Mercy is certainly well-qualified to take care of her back.

## 2020-09-19 NOTE — Telephone Encounter (Signed)
Order has been changed to [facility].

## 2020-10-04 ENCOUNTER — Ambulatory Visit: Payer: No Typology Code available for payment source | Admitting: Orthopaedic Surgery

## 2020-10-11 ENCOUNTER — Ambulatory Visit (HOSPITAL_COMMUNITY)
Admission: RE | Admit: 2020-10-11 | Discharge: 2020-10-11 | Disposition: A | Discharge status: Home / Self Care | Payer: 59 | Source: Ambulatory Visit | Attending: Orthopaedic Surgery | Admitting: Orthopaedic Surgery

## 2020-10-11 ENCOUNTER — Other Ambulatory Visit: Payer: Self-pay

## 2020-10-11 DIAGNOSIS — M48062 Spinal stenosis, lumbar region with neurogenic claudication: Secondary | ICD-10-CM | POA: Diagnosis present

## 2020-10-19 ENCOUNTER — Other Ambulatory Visit: Payer: Self-pay

## 2020-10-19 ENCOUNTER — Encounter: Payer: Self-pay | Admitting: Orthopaedic Surgery

## 2020-10-19 ENCOUNTER — Ambulatory Visit (INDEPENDENT_AMBULATORY_CARE_PROVIDER_SITE_OTHER): Payer: 59 | Admitting: Orthopaedic Surgery

## 2020-10-19 VITALS — Ht 63.0 in | Wt 205.0 lb

## 2020-10-19 DIAGNOSIS — M48062 Spinal stenosis, lumbar region with neurogenic claudication: Secondary | ICD-10-CM

## 2020-10-19 NOTE — Progress Notes (Signed)
Office Visit Note   Patient: Amber Church           Date of Birth: 01/05/1956           MRN: [id number] Visit Date: 10/19/2020              Requested by: Denita Lung, MD [address] [place],  [place] [address] PCP: Denita Lung, MD   Assessment & Plan: Visit Diagnoses:  1. Spinal stenosis of lumbar region with neurogenic claudication     Plan: We discussed walking her grandchild with a stroller.  We reviewed the MRI scan there is moderate stenosis at the L4-5 level.  She can return in 5 weeks and we will repeat lateral lumbar maximal flexion-extension films to compare to earlier films in March to make sure she has not developed instability where she has anterolisthesis.  Follow-Up Instructions: Return in about 5 weeks (around 11/23/2020).   Orders:  No orders of the defined types were placed in this encounter.  No orders of the defined types were placed in this encounter.     Procedures: No procedures performed   Clinical Data: No additional findings.   Subjective: Chief Complaint  Patient presents with   Lower Back - Pain    HPI 65 year old female returns with ongoing problems with back pain.  She has increased pain with prolonged standing and walking.  Pains in the middle of the lumbar region radiates down toward the buttock she rates it as a 7 out of 10.  She is used ibuprofen and Tylenol and states they have stopped working and she is starting to have some stomach problems with the ibuprofen.  She does better leaning on a grocery cart.  MRI scan performed 10/12/2020 and is reviewed with her today.  This shows moderate canal stenosis at L4-5 and mild at L3-4.  Minimal interval progression of anterolisthesis at those 2 levels.  Review of Systems 14 point update unchanged from 04/04/2020.   Objective: Vital Signs: Ht 5\' 3"  (1.6 m)   Wt 205 lb (93 kg)   BMI 36.31 kg/m   Physical Exam Constitutional:      Appearance: She is well-developed.   HENT:     Head: Normocephalic.     Right Ear: External ear normal.     Left Ear: External ear normal. There is no impacted cerumen.  Eyes:     Pupils: Pupils are equal, round, and reactive to light.  Neck:     Thyroid: No thyromegaly.     Trachea: No tracheal deviation.  Cardiovascular:     Rate and Rhythm: Normal rate.  Pulmonary:     Effort: Pulmonary effort is normal.  Abdominal:     Palpations: Abdomen is soft.  Musculoskeletal:     Cervical back: No rigidity.  Skin:    General: Skin is warm and dry.  Neurological:     Mental Status: She is alert and oriented to person, place, and time.  Psychiatric:        Behavior: Behavior normal.    Ortho Exam patient is amatory good knee range of motion right total knee arthroplasty well-healed anteriorly.  Negative logroll of the hips mild sciatic notch tenderness right and left tenderness at the lumbosacral junction also paralumbar muscles.  Reflexes are 2+ and symmetrical.  Distal pulses are intact.  Specialty Comments:  No specialty comments available.  Imaging: No results found.   PMFS History: Patient Active Problem List   Diagnosis Date Noted  Spinal stenosis of lumbar region 04/04/2020   S/P TKR (total knee replacement), right 10/02/2018   Hypertension 08/06/2010   Hyperlipidemia LDL goal <100 08/06/2010   Obesity (BMI 30-39.9) 08/06/2010   Past Medical History:  Diagnosis Date   Abnormal EKG    Abnormal pap    s/p cryotherapy   Anxiety    Arthritis    Depression    Diverticulosis    GERD (gastroesophageal reflux disease)    Hyperlipidemia    Hypertension    S/P colonoscopic polypectomy     Family History  Problem Relation Age of Onset   Alzheimer's disease Mother    Hyperlipidemia Mother    Other Father        died of gunshot wound   Osteoporosis Sister    Osteoporosis Sister    ALS Maternal Aunt    ALS Maternal Uncle    Diabetes Paternal Aunt    Cancer Maternal Grandfather        lung cancer    Cancer Paternal Grandmother        ?type   Heart disease Neg Hx     Past Surgical History:  Procedure Laterality Date   COLONOSCOPY  03/18/11; 2000   Dr. Amedeo Plenty; diverticulosis in sigmoid colon   crysurgery  age 89   for abnormal pap   LAMINECTOMY  03/2002   TOTAL KNEE ARTHROPLASTY Right 10/02/2018   TOTAL KNEE ARTHROPLASTY Right 10/02/2018   Procedure: RIGHT TOTAL KNEE ARTHROPLASTY;  Surgeon: Newt Minion, MD;  Location: [facility];  Service: Orthopedics;  Laterality: Right;   Social History   Occupational History   Occupation: Fish farm manager: [employer]  Tobacco Use   Smoking status: Never   Smokeless tobacco: Never  Vaping Use   Vaping Use: Never used  Substance and Sexual Activity   Alcohol use: Yes    Comment: glass of wine 1-2 times per week.   Drug use: No   Sexual activity: Yes    Partners: Male    Comment: postmenopausal

## 2020-11-01 ENCOUNTER — Telehealth: Payer: Self-pay | Admitting: Orthopaedic Surgery

## 2020-11-01 NOTE — Telephone Encounter (Signed)
Pt called stating she saw Dr.Yates on 10/19/20 and they discussed surgery but she didn't think she was to that extreme. Pt states he also offered her an injection and she states she came across something called needling and wanted his advice. Pt states she has a trip at the beginning of December and doesn't want to be in pain; so if needling doesn't work she'd like to see Dr.Newton.   [phone]

## 2020-11-22 ENCOUNTER — Telehealth: Payer: Self-pay | Admitting: Family Medicine

## 2020-11-22 ENCOUNTER — Telehealth: Payer: Self-pay

## 2020-11-22 DIAGNOSIS — I10 Essential (primary) hypertension: Secondary | ICD-10-CM

## 2020-11-22 DIAGNOSIS — E785 Hyperlipidemia, unspecified: Secondary | ICD-10-CM

## 2020-11-22 MED ORDER — PRAVASTATIN SODIUM 40 MG PO TABS: 40.0000 mg | ORAL_TABLET | Freq: Every day | ORAL | 1 refills | Status: DC

## 2020-11-22 MED ORDER — LISINOPRIL-HYDROCHLOROTHIAZIDE 10-12.5 MG PO TABS: 1.0000 | ORAL_TABLET | Freq: Every day | ORAL | 3 refills | Status: DC

## 2020-11-22 NOTE — Telephone Encounter (Signed)
Med  was sent in [facility]

## 2020-11-22 NOTE — Telephone Encounter (Signed)
Pt is requesting refill on. Ibuprofen 800mg , Lisinopril-hydrochlorothiazide, and Pravastatin sent to [facility] in [place]

## 2020-11-23 ENCOUNTER — Encounter: Payer: Self-pay | Admitting: Orthopaedic Surgery

## 2020-11-23 ENCOUNTER — Other Ambulatory Visit: Payer: Self-pay

## 2020-11-23 ENCOUNTER — Telehealth: Payer: Self-pay | Admitting: Orthopedic Surgery

## 2020-11-23 ENCOUNTER — Ambulatory Visit (INDEPENDENT_AMBULATORY_CARE_PROVIDER_SITE_OTHER): Payer: 59 | Admitting: Orthopaedic Surgery

## 2020-11-23 VITALS — Ht 64.0 in | Wt 198.0 lb

## 2020-11-23 DIAGNOSIS — Z96651 Presence of right artificial knee joint: Secondary | ICD-10-CM

## 2020-11-23 MED ORDER — ACETAMINOPHEN-CODEINE #3 300-30 MG PO TABS: 1.0000 | ORAL_TABLET | Freq: Four times a day (QID) | ORAL | 0 refills | Status: DC | PRN

## 2020-11-23 NOTE — Telephone Encounter (Signed)
Pt called requesting a refill of 800 ibuprofen. Please send to pharmacy on file. Pt phone number is [phone].

## 2020-11-23 NOTE — Telephone Encounter (Signed)
Pt last in office 06/2020 knee pain and is asking for refill on ibuprofen 800mg  do you want to send in refill?

## 2020-11-24 MED ORDER — IBUPROFEN 800 MG PO TABS: 800.0000 mg | ORAL_TABLET | Freq: Three times a day (TID) | ORAL | 0 refills | Status: DC | PRN

## 2020-11-24 NOTE — Progress Notes (Signed)
Office Visit Note   Patient: Amber Church           Date of Birth: 1955/07/31           MRN: [id number] Visit Date: 11/23/2020              Requested by: Denita Lung, MD [place],  [place] [address] PCP: Denita Lung, MD   Assessment & Plan: Visit Diagnoses:  1. S/P TKR (total knee replacement), right     Plan: Patient can call when she has increased symptoms.  She is trying to wait till either late spring or summer for decompression surgery.  We discussed with her she may need repeat imaging.  Today we reviewed her MRI with images showing spinal stenosis at L4-5.  Follow-Up Instructions: No follow-ups on file.   Orders:  No orders of the defined types were placed in this encounter.  Meds ordered this encounter  Medications   acetaminophen-codeine (TYLENOL #3) 300-30 MG tablet    Sig: Take 1 tablet by mouth every 6 (six) hours as needed for moderate pain.    Dispense:  20 tablet    Refill:  0      Procedures: No procedures performed   Clinical Data: No additional findings.   Subjective: Chief Complaint  Patient presents with   Lower Back - Pain, Follow-up    HPI 65 year old female returns with ongoing back problems.  She states she is altering the way she lifts her grandchildren and now has to crawl up on a stool and then get into her lap.  She has been using ibuprofen and Tylenol arthritis.  She gets relief with sitting.  Does better leaning on a grocery cart.  MRI scan showed moderate L4-5 stenosis and mild at L3-4.  Patient states she is trying to wait until spring for surgery and still trying to work on walking and weight loss.  Patient states she has a trip to the beach and is concerned about increased back pain with prolonged  car riding.  Review of Systems 14 point system update unchanged from 04/04/2020.   Objective: Vital Signs: Ht 5\' 4"  (1.626 m)   Wt 198 lb (89.8 kg)   BMI 33.99 kg/m   Physical Exam Constitutional:       Appearance: She is well-developed.  HENT:     Head: Normocephalic.     Right Ear: External ear normal.     Left Ear: External ear normal. There is no impacted cerumen.  Eyes:     Pupils: Pupils are equal, round, and reactive to light.  Neck:     Thyroid: No thyromegaly.     Trachea: No tracheal deviation.  Cardiovascular:     Rate and Rhythm: Normal rate.  Pulmonary:     Effort: Pulmonary effort is normal.  Abdominal:     Palpations: Abdomen is soft.  Musculoskeletal:     Cervical back: No rigidity.  Skin:    General: Skin is warm and dry.  Neurological:     Mental Status: She is alert and oriented to person, place, and time.  Psychiatric:        Behavior: Behavior normal.    Ortho Exam patient is ambulatory without a limp.  No sciatic notch tenderness quads are strong.  Intact distal pulses anterior tib gastrocsoleus is intact no EHL weakness.  Specialty Comments:  No specialty comments available.  Imaging: No results found.   PMFS History: Patient Active Problem List  Diagnosis Date Noted   Spinal stenosis of lumbar region 04/04/2020   S/P TKR (total knee replacement), right 10/02/2018   Hypertension 08/06/2010   Hyperlipidemia LDL goal <100 08/06/2010   Obesity (BMI 30-39.9) 08/06/2010   Past Medical History:  Diagnosis Date   Abnormal EKG    Abnormal pap    s/p cryotherapy   Anxiety    Arthritis    Depression    Diverticulosis    GERD (gastroesophageal reflux disease)    Hyperlipidemia    Hypertension    S/P colonoscopic polypectomy     Family History  Problem Relation Age of Onset   Alzheimer's disease Mother    Hyperlipidemia Mother    Other Father        died of gunshot wound   Osteoporosis Sister    Osteoporosis Sister    ALS Maternal Aunt    ALS Maternal Uncle    Diabetes Paternal Aunt    Cancer Maternal Grandfather        lung cancer   Cancer Paternal Grandmother        ?type   Heart disease Neg Hx     Past Surgical History:   Procedure Laterality Date   COLONOSCOPY  03/18/11; 2000   Dr. Amedeo Plenty; diverticulosis in sigmoid colon   crysurgery  age 29   for abnormal pap   LAMINECTOMY  03/2002   TOTAL KNEE ARTHROPLASTY Right 10/02/2018   TOTAL KNEE ARTHROPLASTY Right 10/02/2018   Procedure: RIGHT TOTAL KNEE ARTHROPLASTY;  Surgeon: Newt Minion, MD;  Location: [facility];  Service: Orthopedics;  Laterality: Right;   Social History   Occupational History   Occupation: Fish farm manager: [employer]  Tobacco Use   Smoking status: Never   Smokeless tobacco: Never  Vaping Use   Vaping Use: Never used  Substance and Sexual Activity   Alcohol use: Yes    Comment: glass of wine 1-2 times per week.   Drug use: No   Sexual activity: Yes    Partners: Male    Comment: postmenopausal

## 2021-01-23 ENCOUNTER — Telehealth: Payer: Self-pay | Admitting: Orthopaedic Surgery

## 2021-01-23 ENCOUNTER — Other Ambulatory Visit: Payer: Self-pay | Admitting: Orthopaedic Surgery

## 2021-01-23 MED ORDER — ACETAMINOPHEN-CODEINE #3 300-30 MG PO TABS: 1.0000 | ORAL_TABLET | Freq: Four times a day (QID) | ORAL | 0 refills | Status: DC | PRN

## 2021-01-23 NOTE — Telephone Encounter (Signed)
I called patient and advised. 

## 2021-01-23 NOTE — Telephone Encounter (Signed)
Please advise 

## 2021-01-23 NOTE — Telephone Encounter (Signed)
Pt called and she is going to the beach with her sister, she is wondering if she can get some tylenol #3 to help her with low back pain.   CB [phone]

## 2021-02-09 ENCOUNTER — Telehealth: Payer: 59 | Admitting: Medical

## 2021-02-09 VITALS — Wt 182.0 lb

## 2021-02-09 DIAGNOSIS — U071 COVID-19: Secondary | ICD-10-CM

## 2021-02-09 MED ORDER — NIRMATRELVIR/RITONAVIR (PAXLOVID)TABLET: 3.0000 | ORAL_TABLET | Freq: Two times a day (BID) | ORAL | 0 refills | Status: AC

## 2021-02-09 NOTE — Progress Notes (Signed)
Subjective:     Patient ID: Amber Church, female   DOB: [birth year], 66 y.o.   MRN: [id number]  This visit type was conducted due to national recommendations for restrictions regarding the COVID-19 Pandemic (e.g. social distancing) in an effort to limit this patient's exposure and mitigate transmission in our community.  Due to their co-morbid illnesses, this patient is at least at moderate risk for complications without adequate follow up.  This format is felt to be most appropriate for this patient at this time.    Documentation for virtual audio and video telecommunications through [place] encounter:  The patient was located at home. The provider was located in the office. The patient did consent to this visit and is aware of possible charges through their insurance for this visit.  The other persons participating in this telemedicine service were none. Time spent on call was 20 minutes and in review of previous records 20 minutes total.  This virtual service is not related to other E/M service within previous 7 days.   HPI Chief Complaint  Patient presents with   Covid Positive    Covid positive yesterday. Symptoms since Monday or Tuesday- runny nose, HA, head congestion, alittle cough   Virtual consult for illness. Tested positive for covid yesterday.   Started getting sick 4 days ago.  Current symptoms include runny nose, mild headache, stuffy headache, mild to moderate cough, not necessarily in chest.  Worse congestion in head.  No fever, no chills, no body aches, no nausea, but vomited once with coughing.  No diarrhea.  Loss of smell +.  No sore throat or ear pain.   Using aspirin for symptoms.   No sick exposures.  She has never had covid infection but has had the vaccine and boosters.  Last illness a few years ago.  Nonsmoker, no lung disease.  No wheezing or sob.   No other aggravating or relieving factors. No other complaint.  Past Medical History:  Diagnosis Date    Abnormal EKG    Abnormal pap    s/p cryotherapy   Anxiety    Arthritis    Depression    Diverticulosis    GERD (gastroesophageal reflux disease)    Hyperlipidemia    Hypertension    S/P colonoscopic polypectomy    Current Outpatient Medications on File Prior to Visit  Medication Sig Dispense Refill   acetaminophen (TYLENOL) 650 MG CR tablet Take 650 mg by mouth every 8 (eight) hours as needed for pain.     acetaminophen-codeine (TYLENOL #3) 300-30 MG tablet Take 1 tablet by mouth every 6 (six) hours as needed for moderate pain. 20 tablet 0   diclofenac Sodium (VOLTAREN) 1 % GEL Apply 2 g topically 4 (four) times daily. 150 g 1   ibuprofen (ADVIL) 800 MG tablet Take 1 tablet (800 mg total) by mouth every 8 (eight) hours as needed. 90 tablet 0   lisinopril-hydrochlorothiazide (ZESTORETIC) 10-12.5 MG tablet Take 1 tablet by mouth daily. 90 tablet 3   pravastatin (PRAVACHOL) 40 MG tablet Take 1 tablet (40 mg total) by mouth daily. 90 tablet 1   No current facility-administered medications on file prior to visit.    Review of Systems As in subjective    Objective:   Physical Exam Due to coronavirus pandemic stay at home measures, patient visit was virtual and they were not examined in person.   Wt 182 lb (82.6 kg)    BMI 31.24 kg/m   Gen: wd, wn, nad No  labored breathing or wheezing     Assessment:     Encounter Diagnosis  Name Primary?   COVID-19 virus infection Yes        Plan:   General recommendations: I recommend you rest, hydrate well with water and clear fluids throughout the day.   You can use Tylenol for pain or fever You can use over the counter Delsym or mucinex DM for cough and mucous. You can use over the counter Emetrol for nausea.     Discussed pros/cons of Paxlovid.  She wants to begin this.    Discussed medication.  If you are having trouble breathing, if you are very weak, have high fever 103 or higher consistently despite Tylenol, or  uncontrollable nausea and vomiting, then call or go to the emergency department.    If you have other questions or have other symptoms or questions you are concerned about then please make a virtual visit  Covid symptoms such as fatigue and cough can linger over 2 weeks, even after the initial fever, aches, chills, and other initial symptoms.   Self Quarantine: The CDC, Centers for Disease Control has recommended a self quarantine of 5 days from the start of your illness until you are symptom-free including at least 24 hours of no symptoms including no fever, no shortness of breath, and no body aches and chills, by day 5 before returning to work or general contact with the public.  What does self quarantine mean: avoiding contact with people as much as possible.   Particularly in your house, isolate your self from others in a separate room, wear a mask when possible in the room, particularly if coughing a lot.   Have others bring food, water, medications, etc., to your door, but avoid direct contact with your household contacts during this time to avoid spreading the infection to them.   If you have a separate bathroom and living quarters during the next 2 weeks away from others, that would be preferable.    If you can't completely isolate, then wear a mask, wash hands frequently with soap and water for at least 15 seconds, minimize close contact with others, and have a friend or family member check regularly from a distance to make sure you are not getting seriously worse.     You should not be going out in public, should not be going to stores, to work or other public places until all your symptoms have resolved and at least 5 days + 24 hours of no symptoms at all have transpired.   Ideally you should avoid contact with others for a full 5 days if possible.  One of the goals is to limit spread to high risk people; people that are older and elderly, people with multiple health issues like diabetes,  heart disease, lung disease, and anybody that has weakened immune systems such as people with cancer or on immunosuppressive therapy.       Amber Church was seen today for covid positive.  Diagnoses and all orders for this visit:  COVID-19 virus infection  Other orders -     nirmatrelvir/ritonavir EUA (PAXLOVID) 20 x 150 MG & 10 x 100MG  TABS; Take 3 tablets by mouth 2 (two) times daily for 5 days. (Take nirmatrelvir 150 mg two tablets twice daily for 5 days and ritonavir 100 mg one tablet twice daily for 5 days) Patient GFR is 78   F/u prn

## 2021-05-10 ENCOUNTER — Telehealth: Payer: Self-pay

## 2021-05-10 ENCOUNTER — Other Ambulatory Visit: Payer: Self-pay | Admitting: Orthopaedic Surgery

## 2021-05-10 ENCOUNTER — Telehealth: Payer: Self-pay | Admitting: Orthopaedic Surgery

## 2021-05-10 NOTE — Telephone Encounter (Signed)
Mrs. Bittinger is calling to get a medication refill for pain. She also scheduled an appointment for surgery consultation for her back or 5/9. ?

## 2021-05-10 NOTE — Telephone Encounter (Signed)
Patient spoke with Dr. Lorin Mercy. Previous message in chart. ?

## 2021-05-10 NOTE — Telephone Encounter (Signed)
noted 

## 2021-05-10 NOTE — Telephone Encounter (Signed)
Pt called wanting refill of tylenol #3 for her pain while she wait for her surgery. Please advise.  ?

## 2021-05-10 NOTE — Telephone Encounter (Signed)
Please advise 

## 2021-05-15 ENCOUNTER — Ambulatory Visit: Payer: Medicare Other | Admitting: Orthopaedic Surgery

## 2021-05-16 ENCOUNTER — Other Ambulatory Visit: Payer: Self-pay

## 2021-05-16 ENCOUNTER — Encounter (HOSPITAL_COMMUNITY): Payer: Self-pay | Admitting: Emergency Medicine

## 2021-05-16 ENCOUNTER — Inpatient Hospital Stay (HOSPITAL_COMMUNITY)
Admission: EM | Admit: 2021-05-16 | Discharge: 2021-05-22 | DRG: 417 | Disposition: A | Discharge status: Home / Self Care | Payer: Medicare Other | Attending: Family Medicine | Admitting: Family Medicine

## 2021-05-16 ENCOUNTER — Emergency Department (HOSPITAL_COMMUNITY): Payer: Medicare Other

## 2021-05-16 DIAGNOSIS — R1011 Right upper quadrant pain: Secondary | ICD-10-CM | POA: Diagnosis present

## 2021-05-16 DIAGNOSIS — E669 Obesity, unspecified: Secondary | ICD-10-CM | POA: Diagnosis present

## 2021-05-16 DIAGNOSIS — Z96651 Presence of right artificial knee joint: Secondary | ICD-10-CM | POA: Diagnosis not present

## 2021-05-16 DIAGNOSIS — K219 Gastro-esophageal reflux disease without esophagitis: Secondary | ICD-10-CM | POA: Diagnosis not present

## 2021-05-16 DIAGNOSIS — Z6830 Body mass index (BMI) 30.0-30.9, adult: Secondary | ICD-10-CM

## 2021-05-16 DIAGNOSIS — I10 Essential (primary) hypertension: Secondary | ICD-10-CM | POA: Diagnosis present

## 2021-05-16 DIAGNOSIS — Z8262 Family history of osteoporosis: Secondary | ICD-10-CM

## 2021-05-16 DIAGNOSIS — M48061 Spinal stenosis, lumbar region without neurogenic claudication: Secondary | ICD-10-CM | POA: Diagnosis not present

## 2021-05-16 DIAGNOSIS — K8020 Calculus of gallbladder without cholecystitis without obstruction: Secondary | ICD-10-CM | POA: Diagnosis present

## 2021-05-16 DIAGNOSIS — E785 Hyperlipidemia, unspecified: Secondary | ICD-10-CM | POA: Diagnosis present

## 2021-05-16 DIAGNOSIS — D72829 Elevated white blood cell count, unspecified: Secondary | ICD-10-CM | POA: Diagnosis present

## 2021-05-16 DIAGNOSIS — Z79899 Other long term (current) drug therapy: Secondary | ICD-10-CM

## 2021-05-16 DIAGNOSIS — K851 Biliary acute pancreatitis without necrosis or infection: Secondary | ICD-10-CM | POA: Diagnosis present

## 2021-05-16 DIAGNOSIS — E860 Dehydration: Secondary | ICD-10-CM | POA: Diagnosis not present

## 2021-05-16 DIAGNOSIS — Z801 Family history of malignant neoplasm of trachea, bronchus and lung: Secondary | ICD-10-CM

## 2021-05-16 DIAGNOSIS — Z83438 Family history of other disorder of lipoprotein metabolism and other lipidemia: Secondary | ICD-10-CM | POA: Diagnosis not present

## 2021-05-16 DIAGNOSIS — R7989 Other specified abnormal findings of blood chemistry: Secondary | ICD-10-CM | POA: Diagnosis present

## 2021-05-16 DIAGNOSIS — F32A Depression, unspecified: Secondary | ICD-10-CM | POA: Diagnosis not present

## 2021-05-16 DIAGNOSIS — D751 Secondary polycythemia: Secondary | ICD-10-CM | POA: Diagnosis present

## 2021-05-16 DIAGNOSIS — Z833 Family history of diabetes mellitus: Secondary | ICD-10-CM

## 2021-05-16 DIAGNOSIS — K828 Other specified diseases of gallbladder: Secondary | ICD-10-CM | POA: Diagnosis not present

## 2021-05-16 DIAGNOSIS — F419 Anxiety disorder, unspecified: Secondary | ICD-10-CM | POA: Diagnosis present

## 2021-05-16 DIAGNOSIS — K8000 Calculus of gallbladder with acute cholecystitis without obstruction: Principal | ICD-10-CM | POA: Diagnosis present

## 2021-05-16 DIAGNOSIS — R1013 Epigastric pain: Secondary | ICD-10-CM | POA: Diagnosis not present

## 2021-05-16 DIAGNOSIS — K8012 Calculus of gallbladder with acute and chronic cholecystitis without obstruction: Secondary | ICD-10-CM | POA: Diagnosis present

## 2021-05-16 DIAGNOSIS — K810 Acute cholecystitis: Secondary | ICD-10-CM | POA: Diagnosis not present

## 2021-05-16 DIAGNOSIS — Z820 Family history of epilepsy and other diseases of the nervous system: Secondary | ICD-10-CM | POA: Diagnosis not present

## 2021-05-16 DIAGNOSIS — K567 Ileus, unspecified: Secondary | ICD-10-CM | POA: Diagnosis not present

## 2021-05-16 DIAGNOSIS — K9189 Other postprocedural complications and disorders of digestive system: Secondary | ICD-10-CM | POA: Diagnosis not present

## 2021-05-16 LAB — COMPREHENSIVE METABOLIC PANEL
ALT: 448 U/L — ABNORMAL HIGH (ref 0–44)
AST: 653 U/L — ABNORMAL HIGH (ref 15–41)
Albumin: 4.3 g/dL (ref 3.5–5.0)
Alkaline Phosphatase: 120 U/L (ref 38–126)
Anion gap: 10 (ref 5–15)
BUN: 16 mg/dL (ref 8–23)
CO2: 23 mmol/L (ref 22–32)
Calcium: 9.5 mg/dL (ref 8.9–10.3)
Chloride: 107 mmol/L (ref 98–111)
Creatinine, Ser: 0.93 mg/dL (ref 0.44–1.00)
GFR, Estimated: >60 mL/min (ref >60)
Glucose, Bld: 106 mg/dL — ABNORMAL HIGH (ref 70–99)
Potassium: 3.8 mmol/L (ref 3.5–5.1)
Sodium: 140 mmol/L (ref 135–145)
Total Bilirubin: 2.5 mg/dL — ABNORMAL HIGH (ref 0.3–1.2)
Total Protein: 7.9 g/dL (ref 6.5–8.1)

## 2021-05-16 LAB — CBC WITH DIFFERENTIAL/PLATELET
Abs Immature Granulocytes: 0.07 10*3/uL (ref 0.00–0.07)
Basophils Absolute: 0.1 10*3/uL (ref 0.0–0.1)
Basophils Relative: 0 %
Eosinophils Absolute: 0 10*3/uL (ref 0.0–0.5)
Eosinophils Relative: 0 %
HCT: 47.7 % — ABNORMAL HIGH (ref 36.0–46.0)
Hemoglobin: 15.9 g/dL — ABNORMAL HIGH (ref 12.0–15.0)
Immature Granulocytes: 1 %
Lymphocytes Relative: 7 %
Lymphs Abs: 1.1 10*3/uL (ref 0.7–4.0)
MCH: 30 pg (ref 26.0–34.0)
MCHC: 33.3 g/dL (ref 30.0–36.0)
MCV: 90 fL (ref 80.0–100.0)
Monocytes Absolute: 0.8 10*3/uL (ref 0.1–1.0)
Monocytes Relative: 5 %
Neutro Abs: 13.2 10*3/uL — ABNORMAL HIGH (ref 1.7–7.7)
Neutrophils Relative %: 87 %
Platelets: 299 10*3/uL (ref 150–400)
RBC: 5.3 MIL/uL — ABNORMAL HIGH (ref 3.87–5.11)
RDW: 14.2 % (ref 11.5–15.5)
WBC: 15.2 10*3/uL — ABNORMAL HIGH (ref 4.0–10.5)
nRBC: 0 % (ref 0.0–0.2)

## 2021-05-16 LAB — LIPASE, BLOOD: Lipase: 4958 U/L — ABNORMAL HIGH (ref 11–51)

## 2021-05-16 MED ORDER — ACETAMINOPHEN 325 MG PO TABS: 650.0000 mg | ORAL_TABLET | Freq: Four times a day (QID) | ORAL | Status: DC | PRN

## 2021-05-16 MED ORDER — FENTANYL CITRATE (PF) 100 MCG/2ML IJ SOLN
INTRAMUSCULAR | Status: AC
Start: 2021-05-16 — End: ?
  Filled 2021-05-16: qty 2

## 2021-05-16 MED ORDER — SODIUM CHLORIDE 0.9 % IV SOLN
2.0000 g | INTRAVENOUS | Status: DC
  Administered 2021-05-17 – 2021-05-20 (×4): 2 g via INTRAVENOUS
  Filled 2021-05-16 (×5): qty 20

## 2021-05-16 MED ORDER — MIDAZOLAM HCL 2 MG/2ML IJ SOLN
INTRAMUSCULAR | Status: AC
  Filled 2021-05-16: qty 2

## 2021-05-16 MED ORDER — HYDROMORPHONE HCL 1 MG/ML IJ SOLN
1.0000 mg | Freq: Once | INTRAMUSCULAR | Status: AC
  Administered 2021-05-16: 1 mg via INTRAVENOUS
  Filled 2021-05-16: qty 1

## 2021-05-16 MED ORDER — SODIUM CHLORIDE 0.9 % IV SOLN
2.0000 g | Freq: Once | INTRAVENOUS | Status: AC
  Administered 2021-05-16: 2 g via INTRAVENOUS
  Filled 2021-05-16: qty 20

## 2021-05-16 MED ORDER — CHLORHEXIDINE GLUCONATE CLOTH 2 % EX PADS: 6.0000 | MEDICATED_PAD | Freq: Once | CUTANEOUS | Status: DC

## 2021-05-16 MED ORDER — BISACODYL 5 MG PO TBEC: 5.0000 mg | DELAYED_RELEASE_TABLET | Freq: Every day | ORAL | Status: DC | PRN

## 2021-05-16 MED ORDER — LACTATED RINGERS IV SOLN: INTRAVENOUS | Status: DC

## 2021-05-16 MED ORDER — BUPIVACAINE HCL (PF) 0.5 % IJ SOLN
INTRAMUSCULAR | Status: AC
  Filled 2021-05-16: qty 30

## 2021-05-16 MED ORDER — HYDRALAZINE HCL 20 MG/ML IJ SOLN
10.0000 mg | INTRAMUSCULAR | Status: DC | PRN
  Administered 2021-05-21: 10 mg via INTRAVENOUS
  Filled 2021-05-16: qty 1

## 2021-05-16 MED ORDER — ONDANSETRON HCL 4 MG PO TABS: 4.0000 mg | ORAL_TABLET | Freq: Four times a day (QID) | ORAL | Status: DC | PRN

## 2021-05-16 MED ORDER — OXYCODONE HCL 5 MG PO TABS
5.0000 mg | ORAL_TABLET | ORAL | Status: DC | PRN
  Administered 2021-05-16 – 2021-05-18 (×2): 5 mg via ORAL
  Filled 2021-05-16 (×2): qty 1

## 2021-05-16 MED ORDER — SODIUM CHLORIDE 0.9 % IV SOLN: Freq: Once | INTRAVENOUS | Status: DC

## 2021-05-16 MED ORDER — PROPOFOL 10 MG/ML IV BOLUS
INTRAVENOUS | Status: AC
  Filled 2021-05-16: qty 20

## 2021-05-16 MED ORDER — SODIUM CHLORIDE 0.9 % IV BOLUS
500.0000 mL | Freq: Once | INTRAVENOUS | Status: AC
Start: 2021-05-16 — End: 2021-05-16
  Administered 2021-05-16: 500 mL via INTRAVENOUS

## 2021-05-16 MED ORDER — ONDANSETRON HCL 4 MG/2ML IJ SOLN: 4.0000 mg | Freq: Four times a day (QID) | INTRAMUSCULAR | Status: DC | PRN

## 2021-05-16 MED ORDER — HYDROMORPHONE HCL 1 MG/ML IJ SOLN
0.5000 mg | INTRAMUSCULAR | Status: DC | PRN
  Administered 2021-05-16 – 2021-05-17 (×10): 0.5 mg via INTRAVENOUS
  Filled 2021-05-16 (×10): qty 0.5

## 2021-05-16 MED ORDER — ONDANSETRON HCL 4 MG/2ML IJ SOLN
4.0000 mg | Freq: Once | INTRAMUSCULAR | Status: AC
  Administered 2021-05-16: 4 mg via INTRAVENOUS
  Filled 2021-05-16: qty 2

## 2021-05-16 MED ORDER — ACETAMINOPHEN 650 MG RE SUPP: 650.0000 mg | Freq: Four times a day (QID) | RECTAL | Status: DC | PRN

## 2021-05-16 MED ORDER — PROCHLORPERAZINE EDISYLATE 10 MG/2ML IJ SOLN
10.0000 mg | INTRAMUSCULAR | Status: DC | PRN
  Administered 2021-05-16: 10 mg via INTRAVENOUS
  Filled 2021-05-16: qty 2

## 2021-05-16 NOTE — ED Notes (Signed)
Bedside u/s

## 2021-05-16 NOTE — Assessment & Plan Note (Addendum)
--   trending down post lap chole ? ?

## 2021-05-16 NOTE — Assessment & Plan Note (Signed)
--   currently stable, follow  ?

## 2021-05-16 NOTE — Assessment & Plan Note (Addendum)
--   resolved now after lap chole ?-- General surgery following ?-- Pt is postop s/p lap chole 5/12  ?-- pain much better controlled now ?-- DC home follow up with surgery next week  ?

## 2021-05-16 NOTE — Progress Notes (Signed)
Patient brought to pre-op for Laparoscopic Cholecystectomy , after further review of patient status and lab results, surgical procedure cancelled for today. Will monitor labs today. Patient and family notified.  Report called to [phone] for room assignment of 340. Transported by wheelchair. ?

## 2021-05-16 NOTE — ED Triage Notes (Signed)
Pt c/o abd pain with n/v since yesterday.  ?

## 2021-05-16 NOTE — Progress Notes (Signed)
Pt states nausea a little better, still c/o mid abd pain with radiation to RUQ and mid-back, rates 9/10, down from 10/10 prior to Dilaudid admin. Pt given warm compress to mid abd for comfort, states will attempt to take nap. Sister remains at bedside.  ?

## 2021-05-16 NOTE — Progress Notes (Signed)
Pt arrived to room #340 via [place] from [place] ambulatory from chair to bathroom and then to bed, voided without difficulty. Pt currently complains mid abd pain radiating up into RUQ and mid back. Pt denies nausea at this time and is requesting ginger ale to drink. Pt oriented to room and safety procedures, states understanding. Call bell within reach. ?

## 2021-05-16 NOTE — ED Notes (Signed)
Given ice chips.

## 2021-05-16 NOTE — Consult Note (Signed)
[facility] Consult ? ?Reason for Consult: Cholelithiasis, possible acute cholecystitis ?Referring Physician: Dr. Melina Copa ? ?Chief Complaint   ?Abdominal Pain ?  ? ? ?HPI: Amber Church is a 66 y.o. female who presents to the hospital with a 1 day history of epigastric abdominal pain with nausea and vomiting.  She states that she has had pain similar to this when she has had heartburn, however this time she had nausea with an episode of emesis.  She took some Tums and it made the pain a little better, but then she had continued abdominal pain.  She does confirm that her epigastric abdominal pain presents after eating food.  She denies any history of abdominal surgeries.  She denies use of blood thinning medications.  She denies use of tobacco products and illicit drugs.  She does drink multiple drinks at least 1 day a week.  Her last drink was over a week ago. ? ?In the ED she underwent an abdominal ultrasound which shows cholelithiasis without signs of acute cholecystitis.  Common bile duct was 0.5 cm.  Her blood work demonstrated a leukocytosis of 15.2, lipase of 5000, total bilirubin of 2.5, and elevated AST and ALT. ? ?Past Medical History:  ?Diagnosis Date  ? Abnormal EKG   ? Abnormal pap   ? s/p cryotherapy  ? Anxiety   ? Arthritis   ? Depression   ? Diverticulosis   ? GERD (gastroesophageal reflux disease)   ? Hyperlipidemia   ? Hypertension   ? S/P colonoscopic polypectomy   ? ? ?Past Surgical History:  ?Procedure Laterality Date  ? COLONOSCOPY  03/18/11; 2000  ? Dr. Amedeo Plenty; diverticulosis in sigmoid colon  ? crysurgery  age 63  ? for abnormal pap  ? LAMINECTOMY  03/2002  ? TOTAL KNEE ARTHROPLASTY Right 10/02/2018  ? TOTAL KNEE ARTHROPLASTY Right 10/02/2018  ? Procedure: RIGHT TOTAL KNEE ARTHROPLASTY;  Surgeon: Newt Minion, MD;  Location: [facility];  Service: Orthopedics;  Laterality: Right;  ? ? ?Family History  ?Problem Relation Age of Onset  ? Alzheimer's disease Mother   ?  Hyperlipidemia Mother   ? Other Father   ?     died of gunshot wound  ? Osteoporosis Sister   ? Osteoporosis Sister   ? ALS Maternal Aunt   ? ALS Maternal Uncle   ? Diabetes Paternal Aunt   ? Cancer Maternal Grandfather   ?     lung cancer  ? Cancer Paternal Grandmother   ?     ?type  ? Heart disease Neg Hx   ? ? ?Social History  ? ?Tobacco Use  ? Smoking status: Never  ? Smokeless tobacco: Never  ?Vaping Use  ? Vaping Use: Never used  ?Substance Use Topics  ? Alcohol use: Yes  ?  Comment: glass of wine 1-2 times per week.  ? Drug use: No  ? ? ?Medications: I have reviewed the patient's current medications. ? ?No Known Allergies ? ? ?ROS:  ?Constitutional: negative for chills, fatigue, and fevers ?Respiratory: negative for cough, wheezing, and shortness of breath ?Cardiovascular: negative for chest pain and palpitations ?Gastrointestinal: positive for abdominal pain and nausea, negative for vomiting ?Musculoskeletal:positive for back pain ? ?Blood pressure (!) 136/94, pulse 64, temperature 97.9 ?F (36.6 ?C), temperature source Oral, resp. rate 20, height '5\' 4"'$  (1.626 m), weight 79.4 kg, SpO2 100 %. ?Physical Exam ?Vitals reviewed.  ?Constitutional:   ?   Appearance: She is well-developed.  ?HENT:  ?  Head: Normocephalic and atraumatic.  ?Eyes:  ?   Extraocular Movements: Extraocular movements intact.  ?   Pupils: Pupils are equal, round, and reactive to light.  ?Cardiovascular:  ?   Rate and Rhythm: Normal rate.  ?Pulmonary:  ?   Effort: Pulmonary effort is normal.  ?Abdominal:  ?   Comments: Abdomen soft, nondistended, no percussion tenderness, tenderness to palpation in epigastrium; no rigidity, guarding, rebound tenderness; negative Murphy's  ?Skin: ?   General: Skin is warm and dry.  ?Neurological:  ?   General: No focal deficit present.  ?   Mental Status: She is alert and oriented to person, place, and time.  ?Psychiatric:     ?   Mood and Affect: Mood normal.     ?   Behavior: Behavior normal.   ? ? ?Results: ?Results for orders placed or performed during the hospital encounter of 05/16/21 (from the past 48 hour(s))  ?CBC with Differential/Platelet     Status: Abnormal  ? Collection Time: 05/16/21  6:40 AM  ?Result Value Ref Range  ? WBC 15.2 (H) 4.0 - 10.5 K/uL  ? RBC 5.30 (H) 3.87 - 5.11 MIL/uL  ? Hemoglobin 15.9 (H) 12.0 - 15.0 g/dL  ? HCT 47.7 (H) 36.0 - 46.0 %  ? MCV 90.0 80.0 - 100.0 fL  ? MCH 30.0 26.0 - 34.0 pg  ? MCHC 33.3 30.0 - 36.0 g/dL  ? RDW 14.2 11.5 - 15.5 %  ? Platelets 299 150 - 400 K/uL  ? nRBC 0.0 0.0 - 0.2 %  ? Neutrophils Relative % 87 %  ? Neutro Abs 13.2 (H) 1.7 - 7.7 K/uL  ? Lymphocytes Relative 7 %  ? Lymphs Abs 1.1 0.7 - 4.0 K/uL  ? Monocytes Relative 5 %  ? Monocytes Absolute 0.8 0.1 - 1.0 K/uL  ? Eosinophils Relative 0 %  ? Eosinophils Absolute 0.0 0.0 - 0.5 K/uL  ? Basophils Relative 0 %  ? Basophils Absolute 0.1 0.0 - 0.1 K/uL  ? Immature Granulocytes 1 %  ? Abs Immature Granulocytes 0.07 0.00 - 0.07 K/uL  ?  Comment: Performed at [facility], [address] [place], [place] [address]  ?Comprehensive metabolic panel     Status: Abnormal  ? Collection Time: 05/16/21  6:40 AM  ?Result Value Ref Range  ? Sodium 140 135 - 145 mmol/L  ? Potassium 3.8 3.5 - 5.1 mmol/L  ? Chloride 107 98 - 111 mmol/L  ? CO2 23 22 - 32 mmol/L  ? Glucose, Bld 106 (H) 70 - 99 mg/dL  ?  Comment: Glucose reference range applies only to samples taken after fasting for at least 8 hours.  ? BUN 16 8 - 23 mg/dL  ? Creatinine, Ser 0.93 0.44 - 1.00 mg/dL  ? Calcium 9.5 8.9 - 10.3 mg/dL  ? Total Protein 7.9 6.5 - 8.1 g/dL  ? Albumin 4.3 3.5 - 5.0 g/dL  ? AST 653 (H) 15 - 41 U/L  ? ALT 448 (H) 0 - 44 U/L  ? Alkaline Phosphatase 120 38 - 126 U/L  ? Total Bilirubin 2.5 (H) 0.3 - 1.2 mg/dL  ? GFR, Estimated >60 >60 mL/min  ?  Comment: (NOTE) ?Calculated using the CKD-EPI Creatinine Equation (2021) ?  ? Anion gap 10 5 - 15  ?  Comment: Performed at [facility], [address], [place], [place] [address]  ?Lipase,  blood     Status: Abnormal  ? Collection Time: 05/16/21  6:40 AM  ?Result  Value Ref Range  ? Lipase 4,958 (H) 11 - 51 U/L  ?  Comment: RESULTS CONFIRMED BY MANUAL DILUTION ?Performed at [facility], [address], [place], [place] [address] ?  ? ? ?US ABDOMEN LIMITED RUQ (LIVER/GB) ? ?Result Date: 05/16/2021 ?EXAM: ULTRASOUND ABDOMEN LIMITED RIGHT UPPER QUADRANT COMPARISON:  None Available. FINDINGS: Gallbladder: No wall thickening visualized. Multiple gallstone seen, largest measures 1.1 cm. No sonographic Murphy sign noted by sonographer. Common bile duct: Diameter: 0.5 mm No intrahepatic biliary ductal dilation. Liver: No focal lesion identified. Within normal limits in parenchymal echogenicity. Portal vein is patent on color Doppler imaging with normal direction of blood flow towards the liver. Other: None. IMPRESSION: Cholelithiasis with no sonographic evidence of acute cholecystitis. Electronically Signed   By: Yetta Glassman M.D.   On: 05/16/2021 08:09   ? ? ?Assessment & Plan:  ?Amber Church is a 66 y.o. female who presents to the hospital with a 1 day history of epigastric abdominal pain with nausea and vomiting.  Abdominal ultrasound with cholelithiasis without signs of acute cholecystitis, CBD 0.5.  WBC 15.8, T. bili 2.5, lipase 5000.  Imaging and blood work evaluated by myself. ? ?-Patient with likely gallstone pancreatitis given epigastric abdominal pain and elevated lipase ?-Given elevation in lipase and total bilirubin, will treat pancreatitis conservatively and monitor for trends in her LFTs ?-Explained to the patient that she will need laparoscopic cholecystectomy at some point during this admission, but we want to verify she does not have choledocholithiasis prior to surgical intervention ?-I counseled the patient about the indication, risks and benefits of laparoscopic cholecystectomy.  She understands there is a very small chance for bleeding, infection, injury to normal structures  (including common bile duct), conversion to open surgery, persistent symptoms, evolution of postcholecystectomy diarrhea, need for secondary interventions, anesthesia reaction, cardiopulmonary issues and other risks not specifically

## 2021-05-16 NOTE — ED Provider Notes (Signed)
Signout from Dr. Waverly Ferrari, 66 year old female with upper abdominal pain.  She is pending labs and imaging right upper quadrant ultrasound.  Plan is symptomatic treatment and disposition per results of imaging. ?Physical Exam  ?BP 103/77   Pulse 61   Resp 17   Ht '5\' 4"'$  (1.626 m)   Wt 79.4 kg   SpO2 99%   BMI 30.04 kg/m?  ? ?Physical Exam ? ?Procedures  ?Procedures ? ?ED Course / MDM  ?  ?Medical Decision Making ?Amount and/or Complexity of Data Reviewed ?Labs: ordered. ?Radiology: ordered. ? ?Risk ?Prescription drug management. ?Decision regarding hospitalization. ? ? ?Patient awake and alert.  Tender epigastrium and right upper quadrant.  Labs concerning for elevated LFTs and ultrasound showing cholelithiasis no evidence of cholecystitis.  Does have elevated white count.  Symptoms started yesterday acutely and still 6 out of 10 abdominal pain after medications. ? ?Labs showing elevated LFTs and bili, elevated lipase.  Discussed with Dr. Okey Dupre and Dr. Wynetta Emery [facility] hospitalist.  Patient in agreement with plan for admission for further work-up. ? ? ? ? ?  ?Hayden Rasmussen, MD ?05/16/21 1806 ? ?

## 2021-05-16 NOTE — H&P (Addendum)
?History and Physical  ?[place] ? ?Jenya Putz Venuti LZJ:[id number] DOB: 12/30/1955 DOA: 05/16/2021 ? ?PCP: Denita Lung, MD  ?Patient coming from: Home  ?Level of care: Med-Surg ? ?I have personally briefly reviewed patient's old medical records in [place] ? ?Chief Complaint: abdominal pain  ? ?HPI: Amber Church is a 66 y/o female with hypertension, hyperlipidemia, GERD, diverticulosis, osteoarthritis, anxiety and depression presented to the emergency department early this morning with symptoms of right upper quadrant abdominal pain associated with nausea and vomiting.  Symptoms started yesterday around noon.  Patient says symptoms have been intermittent lasting approximately 1 hour.  Patient reports that pain returned after eating later in the day and has been severe since that time.  Patient denies shortness of breath chest pain.  Patient denies fever chills diarrhea constipation.  Patient was noted to have elevated LFTs and leukocytosis and sent for right upper quadrant abdominal ultrasound with findings of cholelithiasis but no acute cholecystitis.  Surgery was consulted and recommended admission and possible operative management patient to remain NPO. ? ?Review of Systems: Review of Systems  ?Constitutional:  Negative for chills, diaphoresis, fever, malaise/fatigue and weight loss.  ?HENT: Negative.    ?Eyes: Negative.   ?Respiratory: Negative.    ?Cardiovascular: Negative.   ?Gastrointestinal:  Positive for abdominal pain, heartburn, nausea and vomiting.  ?Genitourinary: Negative.   ?Musculoskeletal: Negative.   ?Skin:  Negative for rash.  ?Neurological: Negative.   ?Endo/Heme/Allergies: Negative.   ?Psychiatric/Behavioral: Negative.    ?  ?Past Medical History:  ?Diagnosis Date  ? Abnormal EKG   ? Abnormal pap   ? s/p cryotherapy  ? Anxiety   ? Arthritis   ? Depression   ? Diverticulosis   ? GERD (gastroesophageal reflux disease)   ? Hyperlipidemia   ? Hypertension   ? S/P  colonoscopic polypectomy   ? ? ?Past Surgical History:  ?Procedure Laterality Date  ? COLONOSCOPY  03/18/11; 2000  ? Dr. Amedeo Plenty; diverticulosis in sigmoid colon  ? crysurgery  age 89  ? for abnormal pap  ? LAMINECTOMY  03/2002  ? TOTAL KNEE ARTHROPLASTY Right 10/02/2018  ? TOTAL KNEE ARTHROPLASTY Right 10/02/2018  ? Procedure: RIGHT TOTAL KNEE ARTHROPLASTY;  Surgeon: Newt Minion, MD;  Location: [facility];  Service: Orthopedics;  Laterality: Right;  ? ? ? reports that she has never smoked. She has never used smokeless tobacco. She reports current alcohol use. She reports that she does not use drugs. ? ?No Known Allergies ? ?Family History  ?Problem Relation Age of Onset  ? Alzheimer's disease Mother   ? Hyperlipidemia Mother   ? Other Father   ?     died of gunshot wound  ? Osteoporosis Sister   ? Osteoporosis Sister   ? ALS Maternal Aunt   ? ALS Maternal Uncle   ? Diabetes Paternal Aunt   ? Cancer Maternal Grandfather   ?     lung cancer  ? Cancer Paternal Grandmother   ?     ?type  ? Heart disease Neg Hx   ? ? ?Prior to Admission medications   ?Medication Sig Start Date End Date Taking? Authorizing Provider  ?acetaminophen (TYLENOL) 650 MG CR tablet Take 650 mg by mouth every 8 (eight) hours as needed for pain.    [provider]  ?acetaminophen-codeine (TYLENOL #3) 300-30 MG tablet Take 1 tablet by mouth every 6 (six) hours as needed for moderate pain. 01/23/21   Marybelle Killings, MD  ?  diclofenac Sodium (VOLTAREN) 1 % GEL Apply 2 g topically 4 (four) times daily. 05/10/20   Denita Lung, MD  ?ibuprofen (ADVIL) 800 MG tablet Take 1 tablet (800 mg total) by mouth every 8 (eight) hours as needed. 11/24/20   Suzan Slick, NP  ?lisinopril-hydrochlorothiazide (ZESTORETIC) 10-12.5 MG tablet Take 1 tablet by mouth daily. 11/22/20   Denita Lung, MD  ?pravastatin (PRAVACHOL) 40 MG tablet Take 1 tablet (40 mg total) by mouth daily. 11/22/20   Denita Lung, MD  ? ? ?Physical Exam: ?Vitals:  ? 05/16/21 0700 05/16/21  0800 05/16/21 0926 05/16/21 0954  ?BP: 105/71 117/82 (!) 136/94 (!) 150/83  ?Pulse: 65 61 64 (!) 57  ?Resp: 18 (!) '21 20 14  '$ ?Temp:   97.9 ?F (36.6 ?C) 97.6 ?F (36.4 ?C)  ?TempSrc:   Oral Oral  ?SpO2: 95% 95% 100% 94%  ?Weight:    79.4 kg  ?Height:    '5\' 4"'$  (1.626 m)  ? ? ?Constitutional: NAD, calm, comfortable ?Eyes: PERRL, lids and conjunctivae normal ?ENMT: Mucous membranes are moist. Posterior pharynx clear of any exudate or lesions.Normal dentition.  ?Neck: normal, supple, no masses, no thyromegaly ?Respiratory: clear to auscultation bilaterally, no wheezing, no crackles. Normal respiratory effort. No accessory muscle use.  ?Cardiovascular: normal s1, s2 sounds, no murmurs / rubs / gallops. No extremity edema. 2+ pedal pulses. No carotid bruits.  ?Abdomen: diffuse RUQ tenderness, no masses palpated. No hepatosplenomegaly. Bowel sounds positive.  ?Musculoskeletal: no clubbing / cyanosis. No joint deformity upper and lower extremities. Good ROM, no contractures. Normal muscle tone.  ?Skin: no rashes, lesions, ulcers. No induration ?Neurologic: CN 2-12 grossly intact. Sensation intact, DTR normal. Strength 5/5 in all 4.  ?Psychiatric: Normal judgment and insight. Alert and oriented x 3. Normal mood.  ? ?Labs on Admission: I have personally reviewed following labs and imaging studies ? ?CBC: ?Recent Labs  ?Lab 05/16/21 ?9735  ?WBC 15.2*  ?NEUTROABS 13.2*  ?HGB 15.9*  ?HCT 47.7*  ?MCV 90.0  ?PLT 299  ? ?Basic Metabolic Panel: ?Recent Labs  ?Lab 05/16/21 ?3299  ?NA 140  ?K 3.8  ?CL 107  ?CO2 23  ?GLUCOSE 106*  ?BUN 16  ?CREATININE 0.93  ?CALCIUM 9.5  ? ?GFR: ?Estimated Creatinine Clearance: 62.3 mL/min (by C-G formula based on SCr of 0.93 mg/dL). ?Liver Function Tests: ?Recent Labs  ?Lab 05/16/21 ?2426  ?AST 653*  ?ALT 448*  ?ALKPHOS 120  ?BILITOT 2.5*  ?PROT 7.9  ?ALBUMIN 4.3  ? ?Recent Labs  ?Lab 05/16/21 ?8341  ?LIPASE 4,958*  ? ?No results for input(s): AMMONIA in the last 168 hours. ?Coagulation Profile: ?No  results for input(s): INR, PROTIME in the last 168 hours. ?Cardiac Enzymes: ?No results for input(s): CKTOTAL, CKMB, CKMBINDEX, TROPONINI in the last 168 hours. ?BNP (last 3 results) ?No results for input(s): PROBNP in the last 8760 hours. ?HbA1C: ?No results for input(s): HGBA1C in the last 72 hours. ?CBG: ?No results for input(s): GLUCAP in the last 168 hours. ?Lipid Profile: ?No results for input(s): CHOL, HDL, LDLCALC, TRIG, CHOLHDL, LDLDIRECT in the last 72 hours. ?Thyroid Function Tests: ?No results for input(s): TSH, T4TOTAL, FREET4, T3FREE, THYROIDAB in the last 72 hours. ?Anemia Panel: ?No results for input(s): VITAMINB12, FOLATE, FERRITIN, TIBC, IRON, RETICCTPCT in the last 72 hours. ?Urine analysis: ?   ?Component Value Date/Time  ? LABSPEC 1.030 06/03/2017 1359  ? BILIRUBINUR negative 06/03/2017 1359  ? BILIRUBINUR neg 03/20/2017 1340  ? KETONESUR negative 06/03/2017 1359  ?  PROTEINUR negative 06/03/2017 1359  ? PROTEINUR neg 03/20/2017 1340  ? UROBILINOGEN negative (A) 03/20/2017 1340  ? NITRITE Negative 06/03/2017 1359  ? NITRITE neg 03/20/2017 1340  ? LEUKOCYTESUR Negative 06/03/2017 1359  ? ? ?Radiological Exams on Admission: ?US ABDOMEN LIMITED RUQ (LIVER/GB) ? ?Result Date: 05/16/2021 ?EXAM: ULTRASOUND ABDOMEN LIMITED RIGHT UPPER QUADRANT COMPARISON:  None Available. FINDINGS: Gallbladder: No wall thickening visualized. Multiple gallstone seen, largest measures 1.1 cm. No sonographic Murphy sign noted by sonographer. Common bile duct: Diameter: 0.5 mm No intrahepatic biliary ductal dilation. Liver: No focal lesion identified. Within normal limits in parenchymal echogenicity. Portal vein is patent on color Doppler imaging with normal direction of blood flow towards the liver. Other: None. IMPRESSION: Cholelithiasis with no sonographic evidence of acute cholecystitis. Electronically Signed   By: Yetta Glassman M.D.   On: 05/16/2021 08:09   ? ?EKG: Independently reviewed.   ? ?Assessment/Plan ?Principal Problem: ?  Symptomatic cholelithiasis ?Active Problems: ?  Colicky RUQ abdominal pain ?  Acute gallstone pancreatitis ?  Hypertension ?  Hyperlipidemia LDL goal <100 ?  Obesity (BMI 30-39.9) ?  Spinal steno

## 2021-05-16 NOTE — Assessment & Plan Note (Addendum)
--   resumed home oral meds ?

## 2021-05-16 NOTE — Assessment & Plan Note (Addendum)
--   stable, resumed home meds  ?

## 2021-05-16 NOTE — Assessment & Plan Note (Addendum)
--   secondary to cholelithiasis, bowel rest ordered and surgery consultation requested ?-- trending down overall, slight postop bump  ?-- s/p lap chole on 5/12 ?-- resolved now ?

## 2021-05-16 NOTE — Assessment & Plan Note (Addendum)
--   postop day #4 s/p lap chole  ?-- lipase normalized now, LFTs coming down ?-- surgery said can discharge home today and followup next week in office ?

## 2021-05-16 NOTE — Assessment & Plan Note (Addendum)
--   surgery consultation appreciated ?-- POD#4 s/p lap chole ?-- routine postop care ?-- tolerating soft diet.  Continue ambulating the halls.  ?-- DC  Home today per surgery with outpatient follow up next week with surgery ?

## 2021-05-16 NOTE — Progress Notes (Signed)
Pt continues to complain of mid abd pain 10/10, restless, rocking on side of bed. Has been up and down to bathroom several times, has only urinated. Denies any further nausea since IV compazine given earlier today. Pt medicated per order. ?

## 2021-05-16 NOTE — Assessment & Plan Note (Signed)
--   stable, BMI 30  ?

## 2021-05-16 NOTE — ED Provider Notes (Signed)
?[facility] ?Provider Note ? ? ?CSN: [id number] ?Arrival date & time: 05/16/21  4818 ? ?  ? ?History ? ?Chief Complaint  ?Patient presents with  ? Abdominal Pain  ? ? ?Amber Church is a 66 y.o. female. ? ?Patient presents to the emergency department for evaluation of upper abdominal pain with nausea and vomiting.  Patient reports that symptoms began around noon yesterday.  She thought it was simply indigestion.  It lasted for about an hour then started to ease off.  It never completely went away and then when she ate later in the day the pain came back and has been present ever since.  Patient reporting moderate to severe upper abdominal pain associated with nausea and vomiting.  No fever.  No chest pain or shortness of breath. ? ? ?  ? ?Home Medications ?Prior to Admission medications   ?Medication Sig Start Date End Date Taking? Authorizing Provider  ?acetaminophen (TYLENOL) 650 MG CR tablet Take 650 mg by mouth every 8 (eight) hours as needed for pain.    [provider]  ?acetaminophen-codeine (TYLENOL #3) 300-30 MG tablet Take 1 tablet by mouth every 6 (six) hours as needed for moderate pain. 01/23/21   Marybelle Killings, MD  ?diclofenac Sodium (VOLTAREN) 1 % GEL Apply 2 g topically 4 (four) times daily. 05/10/20   Denita Lung, MD  ?ibuprofen (ADVIL) 800 MG tablet Take 1 tablet (800 mg total) by mouth every 8 (eight) hours as needed. 11/24/20   Suzan Slick, NP  ?lisinopril-hydrochlorothiazide (ZESTORETIC) 10-12.5 MG tablet Take 1 tablet by mouth daily. 11/22/20   Denita Lung, MD  ?pravastatin (PRAVACHOL) 40 MG tablet Take 1 tablet (40 mg total) by mouth daily. 11/22/20   Denita Lung, MD  ?   ? ?Allergies    ?Patient has no known allergies.   ? ?Review of Systems   ?Review of Systems ? ?Physical Exam ?Updated Vital Signs ?BP 103/77   Pulse 61   Resp 17   Ht '5\' 4"'$  (1.626 m)   Wt 79.4 kg   SpO2 99%   BMI 30.04 kg/m?  ?Physical Exam ?Vitals and nursing note reviewed.   ?Constitutional:   ?   General: She is not in acute distress. ?   Appearance: She is well-developed.  ?HENT:  ?   Head: Normocephalic and atraumatic.  ?   Mouth/Throat:  ?   Mouth: Mucous membranes are moist.  ?Eyes:  ?   General: Vision grossly intact. Gaze aligned appropriately.  ?   Extraocular Movements: Extraocular movements intact.  ?   Conjunctiva/sclera: Conjunctivae normal.  ?Cardiovascular:  ?   Rate and Rhythm: Normal rate and regular rhythm.  ?   Pulses: Normal pulses.  ?   Heart sounds: Normal heart sounds, S1 normal and S2 normal. No murmur heard. ?  No friction rub. No gallop.  ?Pulmonary:  ?   Effort: Pulmonary effort is normal. No respiratory distress.  ?   Breath sounds: Normal breath sounds.  ?Abdominal:  ?   General: Bowel sounds are normal.  ?   Palpations: Abdomen is soft.  ?   Tenderness: There is abdominal tenderness in the right upper quadrant and epigastric area. There is no guarding or rebound.  ?   Hernia: No hernia is present.  ?Musculoskeletal:     ?   General: No swelling.  ?   Cervical back: Full passive range of motion without pain, normal range of motion and neck  supple. No spinous process tenderness or muscular tenderness. Normal range of motion.  ?   Right lower leg: No edema.  ?   Left lower leg: No edema.  ?Skin: ?   General: Skin is warm and dry.  ?   Capillary Refill: Capillary refill takes less than 2 seconds.  ?   Findings: No ecchymosis, erythema, rash or wound.  ?Neurological:  ?   General: No focal deficit present.  ?   Mental Status: She is alert and oriented to person, place, and time.  ?   GCS: GCS eye subscore is 4. GCS verbal subscore is 5. GCS motor subscore is 6.  ?   Cranial Nerves: Cranial nerves 2-12 are intact.  ?   Sensory: Sensation is intact.  ?   Motor: Motor function is intact.  ?   Coordination: Coordination is intact.  ?Psychiatric:     ?   Attention and Perception: Attention normal.     ?   Mood and Affect: Mood normal.     ?   Speech: Speech normal.      ?   Behavior: Behavior normal.  ? ? ?ED Results / Procedures / Treatments   ?Labs ?(all labs ordered are listed, but only abnormal results are displayed) ?Labs Reviewed - No data to display ? ?EKG ?None ? ?Radiology ?No results found. ? ?Procedures ?Procedures  ? ? ?Medications Ordered in ED ?Medications - No data to display ? ?ED Course/ Medical Decision Making/ A&P ?  ?                        ?Medical Decision Making ? ?Patient presents with abdominal pain, nausea and vomiting.  Differential diagnosis considered includes gastritis, peptic ulcer disease, cholecystitis, colitis, small bowel obstruction. ? ?Examination reveals fairly significant tenderness in the epigastric area with moderate tenderness in the right upper quadrant.  Gallbladder disease considered.  Patient will have abdominal pain work-up including labs.  Based on the area of pain, will order right upper quadrant ultrasound to further evaluate for cholecystitis/cholelithiasis.  Will sign out to oncoming ER physician to follow-up results and disposition patient. ? ? ? ? ? ? ? ?Final Clinical Impression(s) / ED Diagnoses ?Final diagnoses:  ?Epigastric pain  ? ? ?Rx / DC Orders ?ED Discharge Orders   ? ? None  ? ?  ? ? ?  ?Orpah Greek, MD ?05/16/21 [phone] ? ?

## 2021-05-16 NOTE — Hospital Course (Addendum)
66 y/o female with hypertension, hyperlipidemia, GERD, diverticulosis, osteoarthritis, anxiety and depression presented to the emergency department early this morning with symptoms of right upper quadrant abdominal pain associated with nausea and vomiting.  Symptoms started yesterday around noon.  Patient says symptoms have been intermittent lasting approximately 1 hour.  Patient reports that pain returned after eating later in the day and has been severe since that time.  Patient denies shortness of breath chest pain.  Patient denies fever chills diarrhea constipation.  Patient was noted to have elevated LFTs and leukocytosis and sent for right upper quadrant abdominal ultrasound with findings of cholelithiasis but no acute cholecystitis. She does have gallstone pancreatitis with markedly elevated lipase on admission. She was started on supportive measures to calm down the pancreatitis.   Surgery was consulted and recommended admission and possible operative management when medically ready. ? ?05/18/2021: Pt taken to OR today for lap chole ?05/19/2021: POD#1 s/p lap chole, feeling better, still having a lot of pain, requiring IV pain meds, full liquid diet today, advance as tolerated.  ?05/20/2021: Postop ileus developing.  Weaning pain meds. Lipase normalized. WBC bumped today.  Continue IV antibiotics.  ?05/21/2021: WBC bumped up to 21. Less pain med usage. Eating diet. No BM.  ?05/22/2021: WBC coming down. Pt having multiple bowel movements and feeling better.  Surgeon evaluated and can discharge home today.  ?

## 2021-05-16 NOTE — Assessment & Plan Note (Addendum)
--   postop s/p lap chole on 5/12 ?-- WBC trending down  ?

## 2021-05-16 NOTE — Assessment & Plan Note (Addendum)
--    Resolved after IV fluids  ?

## 2021-05-16 NOTE — Progress Notes (Signed)
Pt with active nausea and dry heaving, c/o increased RUQ abd pain thru to back after drinking sips of ginger ale. MD Wynetta Emery notified, as it is not time for ordered n/v med. Orders received and pt given Compazine IV and previously ordered Dilaudid.  ?

## 2021-05-17 DIAGNOSIS — F32A Depression, unspecified: Secondary | ICD-10-CM | POA: Diagnosis present

## 2021-05-17 DIAGNOSIS — K8012 Calculus of gallbladder with acute and chronic cholecystitis without obstruction: Secondary | ICD-10-CM | POA: Diagnosis present

## 2021-05-17 DIAGNOSIS — Z833 Family history of diabetes mellitus: Secondary | ICD-10-CM | POA: Diagnosis not present

## 2021-05-17 DIAGNOSIS — K567 Ileus, unspecified: Secondary | ICD-10-CM | POA: Diagnosis not present

## 2021-05-17 DIAGNOSIS — K9189 Other postprocedural complications and disorders of digestive system: Secondary | ICD-10-CM | POA: Diagnosis not present

## 2021-05-17 DIAGNOSIS — K828 Other specified diseases of gallbladder: Secondary | ICD-10-CM | POA: Diagnosis present

## 2021-05-17 DIAGNOSIS — E785 Hyperlipidemia, unspecified: Secondary | ICD-10-CM | POA: Diagnosis present

## 2021-05-17 DIAGNOSIS — Z8262 Family history of osteoporosis: Secondary | ICD-10-CM | POA: Diagnosis not present

## 2021-05-17 DIAGNOSIS — Z820 Family history of epilepsy and other diseases of the nervous system: Secondary | ICD-10-CM | POA: Diagnosis not present

## 2021-05-17 DIAGNOSIS — Z96651 Presence of right artificial knee joint: Secondary | ICD-10-CM | POA: Diagnosis present

## 2021-05-17 DIAGNOSIS — Z6830 Body mass index (BMI) 30.0-30.9, adult: Secondary | ICD-10-CM | POA: Diagnosis not present

## 2021-05-17 DIAGNOSIS — I10 Essential (primary) hypertension: Secondary | ICD-10-CM | POA: Diagnosis present

## 2021-05-17 DIAGNOSIS — K851 Biliary acute pancreatitis without necrosis or infection: Secondary | ICD-10-CM | POA: Diagnosis present

## 2021-05-17 DIAGNOSIS — K8000 Calculus of gallbladder with acute cholecystitis without obstruction: Secondary | ICD-10-CM | POA: Diagnosis present

## 2021-05-17 DIAGNOSIS — K8020 Calculus of gallbladder without cholecystitis without obstruction: Secondary | ICD-10-CM | POA: Diagnosis present

## 2021-05-17 DIAGNOSIS — Z83438 Family history of other disorder of lipoprotein metabolism and other lipidemia: Secondary | ICD-10-CM | POA: Diagnosis not present

## 2021-05-17 DIAGNOSIS — R1011 Right upper quadrant pain: Secondary | ICD-10-CM | POA: Diagnosis not present

## 2021-05-17 DIAGNOSIS — Z801 Family history of malignant neoplasm of trachea, bronchus and lung: Secondary | ICD-10-CM | POA: Diagnosis not present

## 2021-05-17 DIAGNOSIS — K219 Gastro-esophageal reflux disease without esophagitis: Secondary | ICD-10-CM | POA: Diagnosis present

## 2021-05-17 DIAGNOSIS — F419 Anxiety disorder, unspecified: Secondary | ICD-10-CM | POA: Diagnosis present

## 2021-05-17 DIAGNOSIS — E669 Obesity, unspecified: Secondary | ICD-10-CM | POA: Diagnosis present

## 2021-05-17 DIAGNOSIS — M48061 Spinal stenosis, lumbar region without neurogenic claudication: Secondary | ICD-10-CM | POA: Diagnosis present

## 2021-05-17 DIAGNOSIS — K810 Acute cholecystitis: Secondary | ICD-10-CM | POA: Diagnosis not present

## 2021-05-17 DIAGNOSIS — R7989 Other specified abnormal findings of blood chemistry: Secondary | ICD-10-CM | POA: Diagnosis not present

## 2021-05-17 DIAGNOSIS — D751 Secondary polycythemia: Secondary | ICD-10-CM | POA: Diagnosis present

## 2021-05-17 DIAGNOSIS — Z79899 Other long term (current) drug therapy: Secondary | ICD-10-CM | POA: Diagnosis not present

## 2021-05-17 DIAGNOSIS — E860 Dehydration: Secondary | ICD-10-CM | POA: Diagnosis present

## 2021-05-17 LAB — COMPREHENSIVE METABOLIC PANEL
ALT: 235 U/L — ABNORMAL HIGH (ref 0–44)
AST: 184 U/L — ABNORMAL HIGH (ref 15–41)
Albumin: 3.8 g/dL (ref 3.5–5.0)
Alkaline Phosphatase: 102 U/L (ref 38–126)
Anion gap: 8 (ref 5–15)
BUN: 15 mg/dL (ref 8–23)
CO2: 24 mmol/L (ref 22–32)
Calcium: 8.2 mg/dL — ABNORMAL LOW (ref 8.9–10.3)
Chloride: 105 mmol/L (ref 98–111)
Creatinine, Ser: 0.79 mg/dL (ref 0.44–1.00)
GFR, Estimated: >60 mL/min (ref >60)
Glucose, Bld: 124 mg/dL — ABNORMAL HIGH (ref 70–99)
Potassium: 3.6 mmol/L (ref 3.5–5.1)
Sodium: 137 mmol/L (ref 135–145)
Total Bilirubin: 1.3 mg/dL — ABNORMAL HIGH (ref 0.3–1.2)
Total Protein: 6.8 g/dL (ref 6.5–8.1)

## 2021-05-17 LAB — CBC WITH DIFFERENTIAL/PLATELET
Abs Immature Granulocytes: 0.12 10*3/uL — ABNORMAL HIGH (ref 0.00–0.07)
Basophils Absolute: 0 10*3/uL (ref 0.0–0.1)
Basophils Relative: 0 %
Eosinophils Absolute: 1.1 10*3/uL — ABNORMAL HIGH (ref 0.0–0.5)
Eosinophils Relative: 5 %
HCT: 42.5 % (ref 36.0–46.0)
Hemoglobin: 14.1 g/dL (ref 12.0–15.0)
Immature Granulocytes: 1 %
Lymphocytes Relative: 3 %
Lymphs Abs: 0.7 10*3/uL (ref 0.7–4.0)
MCH: 29.7 pg (ref 26.0–34.0)
MCHC: 33.2 g/dL (ref 30.0–36.0)
MCV: 89.5 fL (ref 80.0–100.0)
Monocytes Absolute: 1.2 10*3/uL — ABNORMAL HIGH (ref 0.1–1.0)
Monocytes Relative: 6 %
Neutro Abs: 16.5 10*3/uL — ABNORMAL HIGH (ref 1.7–7.7)
Neutrophils Relative %: 85 %
Platelets: 273 10*3/uL (ref 150–400)
RBC: 4.75 MIL/uL (ref 3.87–5.11)
RDW: 14.5 % (ref 11.5–15.5)
WBC: 19.6 10*3/uL — ABNORMAL HIGH (ref 4.0–10.5)
nRBC: 0 % (ref 0.0–0.2)

## 2021-05-17 LAB — LIPASE, BLOOD: Lipase: 1321 U/L — ABNORMAL HIGH (ref 11–51)

## 2021-05-17 LAB — MAGNESIUM: Magnesium: 1.6 mg/dL — ABNORMAL LOW (ref 1.7–2.4)

## 2021-05-17 LAB — HIV ANTIBODY (ROUTINE TESTING W REFLEX): HIV Screen 4th Generation wRfx: NONREACTIVE

## 2021-05-17 MED ORDER — HYDROMORPHONE HCL 1 MG/ML IJ SOLN
0.5000 mg | INTRAMUSCULAR | Status: DC | PRN
  Administered 2021-05-17 – 2021-05-18 (×9): 0.5 mg via INTRAVENOUS
  Filled 2021-05-17 (×9): qty 0.5

## 2021-05-17 MED ORDER — MAGNESIUM SULFATE 4 GM/100ML IV SOLN
4.0000 g | Freq: Once | INTRAVENOUS | Status: AC
Start: 2021-05-17 — End: 2021-05-17
  Administered 2021-05-17: 4 g via INTRAVENOUS
  Filled 2021-05-17: qty 100

## 2021-05-17 MED ORDER — METRONIDAZOLE 500 MG/100ML IV SOLN
500.0000 mg | Freq: Two times a day (BID) | INTRAVENOUS | Status: DC
  Administered 2021-05-17 – 2021-05-20 (×8): 500 mg via INTRAVENOUS
  Filled 2021-05-17 (×8): qty 100

## 2021-05-17 NOTE — Progress Notes (Addendum)
?PROGRESS NOTE ? ? ?Amber Church  DDU:[id number] DOB: 08-08-1955 DOA: 05/16/2021 ?PCP: Denita Lung, MD  ? ?Chief Complaint  ?Patient presents with  ? Abdominal Pain  ? ?Level of care: Med-Surg ? ?Brief Admission History:  ?66 y/o female with hypertension, hyperlipidemia, GERD, diverticulosis, osteoarthritis, anxiety and depression presented to the emergency department early this morning with symptoms of right upper quadrant abdominal pain associated with nausea and vomiting.  Symptoms started yesterday around noon.  Patient says symptoms have been intermittent lasting approximately 1 hour.  Patient reports that pain returned after eating later in the day and has been severe since that time.  Patient denies shortness of breath chest pain.  Patient denies fever chills diarrhea constipation.  Patient was noted to have elevated LFTs and leukocytosis and sent for right upper quadrant abdominal ultrasound with findings of cholelithiasis but no acute cholecystitis. She does have gallstone pancreatitis with markedly elevated lipase on admission. She was started on supportive measures to calm down the pancreatitis.   Surgery was consulted and recommended admission and possible operative management when medically ready. ?  ?Assessment and Plan: ?* Symptomatic cholelithiasis ?-- surgery consultation requested  ?-- continue NPO status  ?-- routine supportive care  ? ?Acute gallstone pancreatitis ?-- continue NPO status ?-- continue pain management and IV fluid hydration  ?-- waiting for pancreatitis flare to calm down  ?-- lipase trending down  ? ?Colicky RUQ abdominal pain ?-- IV pain and nausea medications ordered ?-- General surgery consultation requested  ?-- pain remains difficult to control, continue supportive measures  ? ?Hypomagnesemia ?-- IV replacement ordered, recheck in AM ? ?Polycythemia ?--  Possibly due to dehydration ?--  Resolved after IV fluids  ? ?Hyperbilirubinemia ?-- secondary to cholelithiasis,  bowel rest ordered and surgery consultation requested ?-- trending down  ? ?Elevated LFTs ?-- trending down  ?-- follow   ? ? ?Leukocytosis ?-- secondary to cholelithiasis/pancreatitis ?-- continue IV antibiotics.  Added metronidazole for better coverage  ?-- recheck CBC/diff in AM  ? ?Spinal stenosis of lumbar region ?-- currently stable, follow  ? ?Obesity (BMI 30-39.9) ?-- stable, BMI 30  ? ?Hyperlipidemia LDL goal <100 ?-- stable, resume home meds when able to take oral again ? ?Hypertension ?-- IV meds ordered while NPO ? ? ?DVT prophylaxis: SCDs ?Code Status: full  ?Family Communication:  ?Disposition: Status is: Observation ?The patient remains OBS appropriate and will d/c before 2 midnights. ?  ?Consultants:  ?Surgery  ?Procedures:  ?TBD  ?Antimicrobials:  ?Ceftriaxone 5/10>> ?Metronidazole 5/11>>  ? ?Subjective: ?Pt reports the abdominal pain shooting into back remains severe ?Objective: ?Vitals:  ? 05/16/21 1458 05/16/21 2134 05/17/21 0518 05/17/21 1225  ?BP: (!) 146/72 (!) 147/77 137/89 (!) 152/93  ?Pulse: 64 61 83 78  ?Resp: '18 17 18 18  '$ ?Temp: 98.3 ?F (36.8 ?C) 97.8 ?F (36.6 ?C) 99.1 ?F (37.3 ?C) 98.6 ?F (37 ?C)  ?TempSrc: Oral  Oral Oral  ?SpO2: 95% 100% 93% 96%  ?Weight:      ?Height:      ? ? ?Intake/Output Summary (Last 24 hours) at 05/17/2021 1251 ?Last data filed at 05/17/2021 1100 ?Gross per 24 hour  ?Intake 1901.67 ml  ?Output --  ?Net 1901.67 ml  ? ?Filed Weights  ? 05/16/21 2376 05/16/21 0954  ?Weight: 79.4 kg 79.4 kg  ? ?Examination: ? ?General exam: Appears calm and UNcomfortable but not distressed.  ?Respiratory system: Clear to auscultation. Respiratory effort normal. ?Cardiovascular system: normal S1 & S2 heard. No  JVD, murmurs, rubs, gallops or clicks. No pedal edema. ?Gastrointestinal system: Abdomen is very tender in epigastric and RUQ area.  No organomegaly or masses felt. Normal bowel sounds heard. ?Central nervous system: Alert and oriented. No focal neurological  deficits. ?Extremities: Symmetric 5 x 5 power. ?Skin: No rashes, lesions or ulcers. ?Psychiatry: Judgement and insight appear normal. Mood & affect appropriate.  ? ?Data Reviewed: I have personally reviewed following labs and imaging studies ? ?CBC: ?Recent Labs  ?Lab 05/16/21 ?0640 05/17/21 ?6962  ?WBC 15.2* 19.6*  ?NEUTROABS 13.2* 16.5*  ?HGB 15.9* 14.1  ?HCT 47.7* 42.5  ?MCV 90.0 89.5  ?PLT 299 273  ? ? ?Basic Metabolic Panel: ?Recent Labs  ?Lab 05/16/21 ?0640 05/17/21 ?9528  ?NA 140 137  ?K 3.8 3.6  ?CL 107 105  ?CO2 23 24  ?GLUCOSE 106* 124*  ?BUN 16 15  ?CREATININE 0.93 0.79  ?CALCIUM 9.5 8.2*  ?MG  --  1.6*  ? ? ?CBG: ?No results for input(s): GLUCAP in the last 168 hours. ? ?No results found for this or any previous visit (from the past 240 hour(s)).  ? ?Radiology Studies: ?US ABDOMEN LIMITED RUQ (LIVER/GB) ? ?Result Date: 05/16/2021 ?EXAM: ULTRASOUND ABDOMEN LIMITED RIGHT UPPER QUADRANT COMPARISON:  None Available. FINDINGS: Gallbladder: No wall thickening visualized. Multiple gallstone seen, largest measures 1.1 cm. No sonographic Murphy sign noted by sonographer. Common bile duct: Diameter: 0.5 mm No intrahepatic biliary ductal dilation. Liver: No focal lesion identified. Within normal limits in parenchymal echogenicity. Portal vein is patent on color Doppler imaging with normal direction of blood flow towards the liver. Other: None. IMPRESSION: Cholelithiasis with no sonographic evidence of acute cholecystitis. Electronically Signed   By: Yetta Glassman M.D.   On: 05/16/2021 08:09   ? ?Scheduled Meds: ? ?Continuous Infusions: ? cefTRIAXone (ROCEPHIN)  IV 2 g (05/17/21 0933)  ? lactated ringers 100 mL/hr at 05/17/21 0919  ? magnesium sulfate bolus IVPB    ? metronidazole 500 mg (05/17/21 1229)  ? ? LOS: 0 days  ? ?Time spent: 38 mins ? ?Irwin Brakeman, MD ?How to contact the [facility] Attending or Consulting provider [place] or covering provider during after hours [place], for this patient?  ?Check the care  team in [facility] and look for a) attending/consulting [facility] provider listed and b) the [facility] team listed ?Log into [email or web address] and use 's universal password to access. If you do not have the password, please contact the hospital operator. ?Locate the [facility] provider you are looking for under [facility] and page to a number that you can be directly reached. ?If you still have difficulty reaching the provider, please page the [facility] (Director on Call) for the Hospitalists listed on amion for assistance. ? ?05/17/2021, 12:51 PM  ? ? ?

## 2021-05-17 NOTE — Care Management Obs Status (Signed)
MEDICARE OBSERVATION STATUS NOTIFICATION ? ? ?Patient Details  ?Name: Amber Church ?MRN: [id number] ?Date of Birth: 1955/07/05 ? ? ?Medicare Observation Status Notification Given:  Yes ? ? ? ?Tommy Medal ?05/17/2021, 3:28 PM ?

## 2021-05-17 NOTE — Progress Notes (Signed)
?  Transition of Care (TOC) Screening Note ? ? ?Patient Details  ?Name: Amber Church ?Date of Birth: 06-21-1955 ? ? ?Transition of Care (TOC) CM/SW Contact:    ?Liddy Deam D, LCSW ?Phone Number: ?05/17/2021, 5:20 PM ? ? ? ?Transition of Care Department [facility]) has reviewed patient and no TOC needs have been identified at this time. We will continue to monitor patient advancement through interdisciplinary progression rounds. If new patient transition needs arise, please place a TOC consult. ?  ?

## 2021-05-17 NOTE — Assessment & Plan Note (Addendum)
--   IV replacement ordered and repleted  

## 2021-05-17 NOTE — H&P (View-Only) (Signed)
[facility] Progress Note ? ?* Surgery Date in Future *  ?Subjective: ?Patient continues to endorse abdominal pain. States it improves with pain medication regimen to 5/10 but worsens to 10/10 pain when medication begins to wear off. She states the pain is most intense in RUQ of abdomen but she can feel it all over. She has taken a few sips of water and apple juice that she has tolerated ok. She endorses some nausea but not emesis. She has not had a BM but voiding well, no concerns. ? ?Objective: ?Vital signs in last 24 hours: ?Temp:  [97.8 ?F (36.6 ?C)-99.1 ?F (37.3 ?C)] 99.1 ?F (37.3 ?C) (05/11 0518) ?Pulse Rate:  [61-83] 83 (05/11 0518) ?Resp:  [17-18] 18 (05/11 0518) ?BP: (137-147)/(72-89) 137/89 (05/11 0518) ?SpO2:  [93 %-100 %] 93 % (05/11 0518) ?  ? ?Intake/Output from previous day: ?05/10 0701 - 05/11 0700 ?In: 1901.7 [P.O.:220; I.V.:1581.7; IV Piggyback:100] ?Out: -  ?Intake/Output this shift: ?No intake/output data recorded. ? ?Physical Exam: ?Constitutional: elderly female laying in bed curled up in pain, Conversant, NAD ?HEENT: atraumatic, normocephalic, moist mucus membranes ?Pulm: normal work of breathing on room air ?Abd: soft, nondistended, severe pain to palpation of RUQ, no rigidity or gaurding  ?Extremities: no edema, no redness of tenderness to bilateral extremities ?Skin: warm and dry, no observed rashes ?Neuro: alert and oriented x3, answering questions appropriately ?Psych: normal mood and affect ? ?Lab Results:  ?Recent Labs  ?  05/16/21 ?0640 05/17/21 ?6226  ?WBC 15.2* 19.6*  ?HGB 15.9* 14.1  ?HCT 47.7* 42.5  ?PLT 299 273  ? ?BMET ?Recent Labs  ?  05/16/21 ?0640 05/17/21 ?0450  ?NA 140 137  ?K 3.8 3.6  ?CL 107 105  ?CO2 23 24  ?GLUCOSE 106* 124*  ?BUN 16 15  ?CREATININE 0.93 0.79  ?CALCIUM 9.5 8.2*  ? ?PT/INR ?No results for input(s): LABPROT, INR in the last 72 hours. ? ?Studies/Results: ?US ABDOMEN LIMITED RUQ (LIVER/GB) ? ?Result Date: 05/16/2021 ?EXAM: ULTRASOUND  ABDOMEN LIMITED RIGHT UPPER QUADRANT COMPARISON:  None Available. FINDINGS: Gallbladder: No wall thickening visualized. Multiple gallstone seen, largest measures 1.1 cm. No sonographic Murphy sign noted by sonographer. Common bile duct: Diameter: 0.5 mm No intrahepatic biliary ductal dilation. Liver: No focal lesion identified. Within normal limits in parenchymal echogenicity. Portal vein is patent on color Doppler imaging with normal direction of blood flow towards the liver. Other: None. IMPRESSION: Cholelithiasis with no sonographic evidence of acute cholecystitis. Electronically Signed   By: Yetta Glassman M.D.   On: 05/16/2021 08:09   ? ?Anti-infectives: ?Anti-infectives (From admission, onward)  ? ? Start     Dose/Rate Route Frequency Ordered Stop  ? 05/17/21 1230  metroNIDAZOLE (FLAGYL) IVPB 500 mg       ? 500 mg ?100 mL/hr over 60 Minutes Intravenous Every 12 hours 05/17/21 1135    ? 05/17/21 0900  cefTRIAXone (ROCEPHIN) 2 g in sodium chloride 0.9 % 100 mL IVPB       ? 2 g ?200 mL/hr over 30 Minutes Intravenous Every 24 hours 05/16/21 0912    ? 05/16/21 0845  cefTRIAXone (ROCEPHIN) 2 g in sodium chloride 0.9 % 100 mL IVPB       ? 2 g ?200 mL/hr over 30 Minutes Intravenous  Once 05/16/21 0838 05/16/21 1250  ? ?  ? ? ?Assessment/Plan: ?s/p Procedure(s): ?LAPAROSCOPIC CHOLECYSTECTOMY ? ?Mrs. Felan is a 66 y.o on hospital day two of admission for gallstone pancreatitis. Today she continues with significant RUQ  pain that is improved with current pain regimen. Her lipase, AST/ALT, and total bili have downtrended with conservative management of pancreatitis overnight. She is with worsened leukocytosis to 19.5 likely secondary to inflammatory process as a result of acute pancreatitis.  We will continue to treat pancreatitis with IV fluids and pain control today with plan for laparoscopic cholecystectomy tomorrow if labs continue to trend downward. ? ?Neuro ?-pain controlled on current regimen ?-continue  prn ? ?GI ?-prn nausea medications onboard ?-clear liquid diet, npo at midnight for tentatively planned surgery tomorrow ? ?GU ?-Continue IV fluids, LR @ 175 mL/hr ? ?ID ?-leukocytosis worsened to 19.5, she remains afebrile, likely secondary to inflamatory process as a result of acute pancreatitis. CTM. ?-Rocephin/Flagyl ? ?Prophylaxis ?-SCDs ordered ? ?Disposition  ?-Spoke with patent at bedside about plan. Will continue with conservative management of pancreatitis with plan for tentatively scheduled laparoscopic cholecystectomy tomorrow if labs conitnnue to trend downward. ?-labs in the AM ? ? LOS: 0 days  ? ? ?Kitty Cadavid J Rodriguez-Teodoro ?05/17/2021  ?

## 2021-05-17 NOTE — Progress Notes (Addendum)
[facility] Progress Note ? ?* Surgery Date in Future *  ?Subjective: ?Patient continues to endorse abdominal pain. States it improves with pain medication regimen to 5/10 but worsens to 10/10 pain when medication begins to wear off. She states the pain is most intense in RUQ of abdomen but she can feel it all over. She has taken a few sips of water and apple juice that she has tolerated ok. She endorses some nausea but not emesis. She has not had a BM but voiding well, no concerns. ? ?Objective: ?Vital signs in last 24 hours: ?Temp:  [97.8 ?F (36.6 ?C)-99.1 ?F (37.3 ?C)] 99.1 ?F (37.3 ?C) (05/11 0518) ?Pulse Rate:  [61-83] 83 (05/11 0518) ?Resp:  [17-18] 18 (05/11 0518) ?BP: (137-147)/(72-89) 137/89 (05/11 0518) ?SpO2:  [93 %-100 %] 93 % (05/11 0518) ?  ? ?Intake/Output from previous day: ?05/10 0701 - 05/11 0700 ?In: 1901.7 [P.O.:220; I.V.:1581.7; IV Piggyback:100] ?Out: -  ?Intake/Output this shift: ?No intake/output data recorded. ? ?Physical Exam: ?Constitutional: elderly female laying in bed curled up in pain, Conversant, NAD ?HEENT: atraumatic, normocephalic, moist mucus membranes ?Pulm: normal work of breathing on room air ?Abd: soft, nondistended, severe pain to palpation of RUQ, no rigidity or gaurding  ?Extremities: no edema, no redness of tenderness to bilateral extremities ?Skin: warm and dry, no observed rashes ?Neuro: alert and oriented x3, answering questions appropriately ?Psych: normal mood and affect ? ?Lab Results:  ?Recent Labs  ?  05/16/21 ?0640 05/17/21 ?5188  ?WBC 15.2* 19.6*  ?HGB 15.9* 14.1  ?HCT 47.7* 42.5  ?PLT 299 273  ? ?BMET ?Recent Labs  ?  05/16/21 ?0640 05/17/21 ?0450  ?NA 140 137  ?K 3.8 3.6  ?CL 107 105  ?CO2 23 24  ?GLUCOSE 106* 124*  ?BUN 16 15  ?CREATININE 0.93 0.79  ?CALCIUM 9.5 8.2*  ? ?PT/INR ?No results for input(s): LABPROT, INR in the last 72 hours. ? ?Studies/Results: ?US ABDOMEN LIMITED RUQ (LIVER/GB) ? ?Result Date: 05/16/2021 ?EXAM: ULTRASOUND  ABDOMEN LIMITED RIGHT UPPER QUADRANT COMPARISON:  None Available. FINDINGS: Gallbladder: No wall thickening visualized. Multiple gallstone seen, largest measures 1.1 cm. No sonographic Murphy sign noted by sonographer. Common bile duct: Diameter: 0.5 mm No intrahepatic biliary ductal dilation. Liver: No focal lesion identified. Within normal limits in parenchymal echogenicity. Portal vein is patent on color Doppler imaging with normal direction of blood flow towards the liver. Other: None. IMPRESSION: Cholelithiasis with no sonographic evidence of acute cholecystitis. Electronically Signed   By: Yetta Glassman M.D.   On: 05/16/2021 08:09   ? ?Anti-infectives: ?Anti-infectives (From admission, onward)  ? ? Start     Dose/Rate Route Frequency Ordered Stop  ? 05/17/21 1230  metroNIDAZOLE (FLAGYL) IVPB 500 mg       ? 500 mg ?100 mL/hr over 60 Minutes Intravenous Every 12 hours 05/17/21 1135    ? 05/17/21 0900  cefTRIAXone (ROCEPHIN) 2 g in sodium chloride 0.9 % 100 mL IVPB       ? 2 g ?200 mL/hr over 30 Minutes Intravenous Every 24 hours 05/16/21 0912    ? 05/16/21 0845  cefTRIAXone (ROCEPHIN) 2 g in sodium chloride 0.9 % 100 mL IVPB       ? 2 g ?200 mL/hr over 30 Minutes Intravenous  Once 05/16/21 0838 05/16/21 1250  ? ?  ? ? ?Assessment/Plan: ?s/p Procedure(s): ?LAPAROSCOPIC CHOLECYSTECTOMY ? ?Mrs. Otoole is a 66 y.o on hospital day two of admission for gallstone pancreatitis. Today she continues with significant RUQ  pain that is improved with current pain regimen. Her lipase, AST/ALT, and total bili have downtrended with conservative management of pancreatitis overnight. She is with worsened leukocytosis to 19.5 likely secondary to inflammatory process as a result of acute pancreatitis.  We will continue to treat pancreatitis with IV fluids and pain control today with plan for laparoscopic cholecystectomy tomorrow if labs continue to trend downward. ? ?Neuro ?-pain controlled on current regimen ?-continue  prn ? ?GI ?-prn nausea medications onboard ?-clear liquid diet, npo at midnight for tentatively planned surgery tomorrow ? ?GU ?-Continue IV fluids, LR @ 175 mL/hr ? ?ID ?-leukocytosis worsened to 19.5, she remains afebrile, likely secondary to inflamatory process as a result of acute pancreatitis. CTM. ?-Rocephin/Flagyl ? ?Prophylaxis ?-SCDs ordered ? ?Disposition  ?-Spoke with patent at bedside about plan. Will continue with conservative management of pancreatitis with plan for tentatively scheduled laparoscopic cholecystectomy tomorrow if labs conitnnue to trend downward. ?-labs in the AM ? ? LOS: 0 days  ? ? ?Peja Allender J Rodriguez-Teodoro ?05/17/2021  ?

## 2021-05-18 ENCOUNTER — Encounter (HOSPITAL_COMMUNITY): Payer: Self-pay | Admitting: Family Medicine

## 2021-05-18 ENCOUNTER — Observation Stay (HOSPITAL_COMMUNITY): Payer: Medicare Other | Admitting: Certified Registered Nurse Anesthetist

## 2021-05-18 ENCOUNTER — Encounter (HOSPITAL_COMMUNITY)
Admission: EM | Disposition: A | Discharge status: Home / Self Care | Payer: Self-pay | Source: Home / Self Care | Attending: Family Medicine

## 2021-05-18 ENCOUNTER — Other Ambulatory Visit: Payer: Self-pay

## 2021-05-18 DIAGNOSIS — K8020 Calculus of gallbladder without cholecystitis without obstruction: Secondary | ICD-10-CM | POA: Diagnosis not present

## 2021-05-18 DIAGNOSIS — K8000 Calculus of gallbladder with acute cholecystitis without obstruction: Secondary | ICD-10-CM | POA: Diagnosis not present

## 2021-05-18 DIAGNOSIS — K810 Acute cholecystitis: Secondary | ICD-10-CM

## 2021-05-18 DIAGNOSIS — K851 Biliary acute pancreatitis without necrosis or infection: Secondary | ICD-10-CM | POA: Diagnosis not present

## 2021-05-18 HISTORY — PX: CHOLECYSTECTOMY: SHX55

## 2021-05-18 LAB — CBC WITH DIFFERENTIAL/PLATELET
Abs Immature Granulocytes: 0.44 10*3/uL — ABNORMAL HIGH (ref 0.00–0.07)
Basophils Absolute: 0.1 10*3/uL (ref 0.0–0.1)
Basophils Relative: 0 %
Eosinophils Absolute: 1.1 10*3/uL — ABNORMAL HIGH (ref 0.0–0.5)
Eosinophils Relative: 4 %
HCT: 39.5 % (ref 36.0–46.0)
Hemoglobin: 13.5 g/dL (ref 12.0–15.0)
Immature Granulocytes: 2 %
Lymphocytes Relative: 3 %
Lymphs Abs: 0.9 10*3/uL (ref 0.7–4.0)
MCH: 30.7 pg (ref 26.0–34.0)
MCHC: 34.2 g/dL (ref 30.0–36.0)
MCV: 89.8 fL (ref 80.0–100.0)
Monocytes Absolute: 1.3 10*3/uL — ABNORMAL HIGH (ref 0.1–1.0)
Monocytes Relative: 5 %
Neutro Abs: 21.2 10*3/uL — ABNORMAL HIGH (ref 1.7–7.7)
Neutrophils Relative %: 86 %
Platelets: 258 10*3/uL (ref 150–400)
RBC: 4.4 MIL/uL (ref 3.87–5.11)
RDW: 14.6 % (ref 11.5–15.5)
WBC: 24.9 10*3/uL — ABNORMAL HIGH (ref 4.0–10.5)
nRBC: 0 % (ref 0.0–0.2)

## 2021-05-18 LAB — COMPREHENSIVE METABOLIC PANEL
ALT: 126 U/L — ABNORMAL HIGH (ref 0–44)
AST: 75 U/L — ABNORMAL HIGH (ref 15–41)
Albumin: 3.2 g/dL — ABNORMAL LOW (ref 3.5–5.0)
Alkaline Phosphatase: 85 U/L (ref 38–126)
Anion gap: 8 (ref 5–15)
BUN: 9 mg/dL (ref 8–23)
CO2: 26 mmol/L (ref 22–32)
Calcium: 7.9 mg/dL — ABNORMAL LOW (ref 8.9–10.3)
Chloride: 102 mmol/L (ref 98–111)
Creatinine, Ser: 0.69 mg/dL (ref 0.44–1.00)
GFR, Estimated: >60 mL/min (ref >60)
Glucose, Bld: 101 mg/dL — ABNORMAL HIGH (ref 70–99)
Potassium: 3.3 mmol/L — ABNORMAL LOW (ref 3.5–5.1)
Sodium: 136 mmol/L (ref 135–145)
Total Bilirubin: 1.4 mg/dL — ABNORMAL HIGH (ref 0.3–1.2)
Total Protein: 6.5 g/dL (ref 6.5–8.1)

## 2021-05-18 LAB — LIPASE, BLOOD: Lipase: 336 U/L — ABNORMAL HIGH (ref 11–51)

## 2021-05-18 LAB — MAGNESIUM: Magnesium: 2.2 mg/dL (ref 1.7–2.4)

## 2021-05-18 SURGERY — LAPAROSCOPIC CHOLECYSTECTOMY
Anesthesia: General | Site: Abdomen

## 2021-05-18 MED ORDER — ONDANSETRON HCL 4 MG/2ML IJ SOLN
INTRAMUSCULAR | Status: DC | PRN
  Administered 2021-05-18: 4 mg via INTRAVENOUS

## 2021-05-18 MED ORDER — HEMOSTATIC AGENTS (NO CHARGE) OPTIME
TOPICAL | Status: DC | PRN
  Administered 2021-05-18 (×2): 1 via TOPICAL

## 2021-05-18 MED ORDER — SODIUM CHLORIDE 0.9 % IV SOLN: INTRAVENOUS | Status: DC | PRN

## 2021-05-18 MED ORDER — FENTANYL CITRATE PF 50 MCG/ML IJ SOSY
25.0000 ug | PREFILLED_SYRINGE | INTRAMUSCULAR | Status: DC | PRN
  Administered 2021-05-18: 50 ug via INTRAVENOUS
  Filled 2021-05-18: qty 1

## 2021-05-18 MED ORDER — POTASSIUM CHLORIDE 10 MEQ/100ML IV SOLN
10.0000 meq | INTRAVENOUS | Status: AC
  Administered 2021-05-18: 10 meq via INTRAVENOUS
  Filled 2021-05-18 (×2): qty 100

## 2021-05-18 MED ORDER — PROPOFOL 10 MG/ML IV BOLUS
INTRAVENOUS | Status: AC
  Filled 2021-05-18: qty 20

## 2021-05-18 MED ORDER — PHENYLEPHRINE HCL (PRESSORS) 10 MG/ML IV SOLN
INTRAVENOUS | Status: DC | PRN
  Administered 2021-05-18 (×3): 80 ug via INTRAVENOUS

## 2021-05-18 MED ORDER — BUPIVACAINE HCL (PF) 0.5 % IJ SOLN
INTRAMUSCULAR | Status: DC | PRN
  Administered 2021-05-18: 20 mL

## 2021-05-18 MED ORDER — SUGAMMADEX SODIUM 500 MG/5ML IV SOLN
INTRAVENOUS | Status: DC | PRN
  Administered 2021-05-18: 200 mg via INTRAVENOUS

## 2021-05-18 MED ORDER — ACETAMINOPHEN 500 MG PO TABS
1000.0000 mg | ORAL_TABLET | Freq: Four times a day (QID) | ORAL | Status: DC
  Administered 2021-05-18 – 2021-05-19 (×7): 1000 mg via ORAL
  Filled 2021-05-18 (×10): qty 2

## 2021-05-18 MED ORDER — ONDANSETRON HCL 4 MG/2ML IJ SOLN
INTRAMUSCULAR | Status: AC
  Filled 2021-05-18: qty 2

## 2021-05-18 MED ORDER — POTASSIUM CHLORIDE 10 MEQ/100ML IV SOLN
10.0000 meq | INTRAVENOUS | Status: AC
  Administered 2021-05-18 (×2): 10 meq via INTRAVENOUS
  Filled 2021-05-18: qty 100

## 2021-05-18 MED ORDER — SODIUM CHLORIDE 0.9 % IR SOLN
Status: DC | PRN
  Administered 2021-05-18: 1000 mL

## 2021-05-18 MED ORDER — LIDOCAINE HCL (CARDIAC) PF 50 MG/5ML IV SOSY
PREFILLED_SYRINGE | INTRAVENOUS | Status: DC | PRN
  Administered 2021-05-18: 50 mg via INTRAVENOUS

## 2021-05-18 MED ORDER — ONDANSETRON HCL 4 MG/2ML IJ SOLN: 4.0000 mg | Freq: Once | INTRAMUSCULAR | Status: DC | PRN

## 2021-05-18 MED ORDER — PHENYLEPHRINE 80 MCG/ML (10ML) SYRINGE FOR IV PUSH (FOR BLOOD PRESSURE SUPPORT)
PREFILLED_SYRINGE | INTRAVENOUS | Status: AC
  Filled 2021-05-18: qty 10

## 2021-05-18 MED ORDER — ROCURONIUM BROMIDE 10 MG/ML (PF) SYRINGE
PREFILLED_SYRINGE | INTRAVENOUS | Status: AC
  Filled 2021-05-18: qty 10

## 2021-05-18 MED ORDER — PROPOFOL 10 MG/ML IV BOLUS
INTRAVENOUS | Status: DC | PRN
  Administered 2021-05-18: 20 mg via INTRAVENOUS
  Administered 2021-05-18: 150 mg via INTRAVENOUS

## 2021-05-18 MED ORDER — FENTANYL CITRATE (PF) 250 MCG/5ML IJ SOLN
INTRAMUSCULAR | Status: AC
  Filled 2021-05-18: qty 5

## 2021-05-18 MED ORDER — ROCURONIUM BROMIDE 100 MG/10ML IV SOLN
INTRAVENOUS | Status: DC | PRN
  Administered 2021-05-18: 50 mg via INTRAVENOUS

## 2021-05-18 MED ORDER — BUPIVACAINE HCL (PF) 0.5 % IJ SOLN
INTRAMUSCULAR | Status: AC
  Filled 2021-05-18: qty 30

## 2021-05-18 MED ORDER — FENTANYL CITRATE (PF) 100 MCG/2ML IJ SOLN
INTRAMUSCULAR | Status: DC | PRN
  Administered 2021-05-18 (×2): 50 ug via INTRAVENOUS
  Administered 2021-05-18: 100 ug via INTRAVENOUS
  Administered 2021-05-18: 50 ug via INTRAVENOUS

## 2021-05-18 SURGICAL SUPPLY — 49 items
ADH SKN CLS APL DERMABOND .7 (GAUZE/BANDAGES/DRESSINGS) ×1
APL PRP STRL LF DISP 70% ISPRP (MISCELLANEOUS) ×1
APL SWBSTK 6 STRL LF DISP (MISCELLANEOUS) ×1
APPLICATOR COTTON TIP 6 STRL (MISCELLANEOUS) IMPLANT
APPLICATOR COTTON TIP 6IN STRL (MISCELLANEOUS) ×2 IMPLANT
APPLIER CLIP ROT 10 11.4 M/L (STAPLE) ×2
APR CLP MED LRG 11.4X10 (STAPLE) ×1
BAG RETRIEVAL 10 (BASKET) ×1
BLADE SURG 15 STRL LF DISP TIS (BLADE) ×2 IMPLANT
BLADE SURG 15 STRL SS (BLADE) ×2
CHLORAPREP W/TINT 26 (MISCELLANEOUS) ×3 IMPLANT
CLIP APPLIE ROT 10 11.4 M/L (STAPLE) ×2 IMPLANT
CLOTH BEACON ORANGE TIMEOUT ST (SAFETY) ×3 IMPLANT
COVER LIGHT HANDLE STERIS (MISCELLANEOUS) ×6 IMPLANT
DECANTER SPIKE VIAL GLASS SM (MISCELLANEOUS) ×3 IMPLANT
DERMABOND ADVANCED (GAUZE/BANDAGES/DRESSINGS) ×1
DERMABOND ADVANCED .7 DNX12 (GAUZE/BANDAGES/DRESSINGS) ×2 IMPLANT
ELECT REM PT RETURN 9FT ADLT (ELECTROSURGICAL) ×2
ELECTRODE REM PT RTRN 9FT ADLT (ELECTROSURGICAL) ×2 IMPLANT
GLOVE BIO SURGEON STRL SZ7 (GLOVE) ×1 IMPLANT
GLOVE BIOGEL PI IND STRL 6.5 (GLOVE) ×2 IMPLANT
GLOVE BIOGEL PI IND STRL 7.0 (GLOVE) ×4 IMPLANT
GLOVE BIOGEL PI INDICATOR 6.5 (GLOVE) ×1
GLOVE BIOGEL PI INDICATOR 7.0 (GLOVE) ×3
GLOVE ECLIPSE 6.5 STRL STRAW (GLOVE) ×1 IMPLANT
GLOVE SURG SS PI 6.5 STRL IVOR (GLOVE) ×7 IMPLANT
GOWN STRL REUS W/TWL LRG LVL3 (GOWN DISPOSABLE) ×10 IMPLANT
HEMOSTAT SNOW SURGICEL 2X4 (HEMOSTASIS) ×4 IMPLANT
INST SET LAPROSCOPIC AP (KITS) ×3 IMPLANT
KIT TURNOVER KIT A (KITS) ×3 IMPLANT
MANIFOLD NEPTUNE II (INSTRUMENTS) ×3 IMPLANT
NDL INSUFFLATION 14GA 120MM (NEEDLE) ×2 IMPLANT
NEEDLE INSUFFLATION 14GA 120MM (NEEDLE) ×2 IMPLANT
NS IRRIG 1000ML POUR BTL (IV SOLUTION) ×3 IMPLANT
PACK LAP CHOLE LZT030E (CUSTOM PROCEDURE TRAY) ×3 IMPLANT
PAD ARMBOARD 7.5X6 YLW CONV (MISCELLANEOUS) ×3 IMPLANT
PENCIL HANDSWITCHING (ELECTRODE) ×1 IMPLANT
SET BASIN LINEN APH (SET/KITS/TRAYS/PACK) ×3 IMPLANT
SET TUBE SMOKE EVAC HIGH FLOW (TUBING) ×3 IMPLANT
SLEEVE ENDOPATH XCEL 5M (ENDOMECHANICALS) ×3 IMPLANT
SUT MNCRL AB 4-0 PS2 18 (SUTURE) ×6 IMPLANT
SUT VICRYL 0 UR6 27IN ABS (SUTURE) ×3 IMPLANT
SYS BAG RETRIEVAL 10MM (BASKET) ×1
SYSTEM BAG RETRIEVAL 10MM (BASKET) ×2 IMPLANT
TROCAR ENDO BLADELESS 11MM (ENDOMECHANICALS) ×3 IMPLANT
TROCAR XCEL NON-BLD 5MMX100MML (ENDOMECHANICALS) ×3 IMPLANT
TROCAR XCEL UNIV SLVE 11M 100M (ENDOMECHANICALS) ×3 IMPLANT
TUBE CONNECTING 12X1/4 (SUCTIONS) ×3 IMPLANT
WARMER LAPAROSCOPE (MISCELLANEOUS) ×3 IMPLANT

## 2021-05-18 NOTE — Progress Notes (Signed)
Update note: ? ?Spoke with the patient's sister in patient room.  Explained that her surgery went well, and we were able to get her gallbladder out without issue.  She did have significant distention and inflammation of her gallbladder, necessitating the surgery.  She has dissolvable stitches under the skin and overlying skin glue, which will flake off in 10 to 14 days.  She will remain in the hospital for the next couple of days while her pain improves.  I will give her clear liquid diet when she is back to the floor.  All questions were answered to her expressed satisfaction. ? ?Plan: ?-Clear liquid diet, advance diet as tolerated ?-IV fluids ?-Pain control and antiemetics as needed ?-Recheck labs tomorrow morning ?-Appreciate hospitalist recommendations ?-Hopefully patient will be stable for discharge in the next 24 to 48 hours ? ?Tura Roller, DO ?[facility] ?[place], [place] [address] ?[phone] (office) ? ?

## 2021-05-18 NOTE — Anesthesia Preprocedure Evaluation (Signed)
Anesthesia Evaluation  ?Patient identified by MRN, date of birth, ID band ?Patient awake ? ? ? ?Reviewed: ?Allergy & Precautions, H&P , NPO status , Patient's Chart, lab work & pertinent test results, reviewed documented beta blocker date and time  ? ?Airway ?Mallampati: II ? ?TM Distance: >3 FB ?Neck ROM: full ? ? ? Dental ?no notable dental hx. ? ?  ?Pulmonary ?neg pulmonary ROS,  ?  ?Pulmonary exam normal ?breath sounds clear to auscultation ? ? ? ? ? ? Cardiovascular ?Exercise Tolerance: Good ?hypertension, negative cardio ROS ? ? ?Rhythm:regular Rate:Normal ? ? ?  ?Neuro/Psych ?PSYCHIATRIC DISORDERS Anxiety Depression negative neurological ROS ?   ? GI/Hepatic ?Neg liver ROS, GERD  Medicated,  ?Endo/Other  ?negative endocrine ROS ? Renal/GU ?negative Renal ROS  ?negative genitourinary ?  ?Musculoskeletal ? ? Abdominal ?  ?Peds ? Hematology ?negative hematology ROS ?(+)   ?Anesthesia Other Findings ? ? Reproductive/Obstetrics ?negative OB ROS ? ?  ? ? ? ? ? ? ? ? ? ? ? ? ? ?  ?  ? ? ? ? ? ? ? ? ?Anesthesia Physical ?Anesthesia Plan ? ?ASA: 2 ? ?Anesthesia Plan: General  ? ?Post-op Pain Management:   ? ?Induction:  ? ?PONV Risk Score and Plan: Propofol infusion ? ?Airway Management Planned:  ? ?Additional Equipment:  ? ?Intra-op Plan:  ? ?Post-operative Plan:  ? ?Informed Consent: I have reviewed the patients History and Physical, chart, labs and discussed the procedure including the risks, benefits and alternatives for the proposed anesthesia with the patient or authorized representative who has indicated his/her understanding and acceptance.  ? ? ? ?Dental Advisory Given ? ?Plan Discussed with: CRNA ? ?Anesthesia Plan Comments:   ? ? ? ? ? ? ?Anesthesia Quick Evaluation ? ?

## 2021-05-18 NOTE — Op Note (Signed)
Operative Note ?  ?Preoperative Diagnosis: Acute cholecystitis, gallstones pancreatitis ?  ?Postoperative Diagnosis: Same ?  ?Procedure(s) Performed: Laparoscopic cholecystectomy ?  ?Surgeon: Graciella Freer, DO  ?  ?Assistants: Curlene Labrum, MD ?  ?Anesthesia: General endotracheal ?  ?Anesthesiologist: Louann Sjogren, MD  ?  ?Specimens: Gallbladder  ?  ?Estimated Blood Loss: Minimal  ?  ?Blood Replacement: None  ?  ?Complications: None  ?  ?Operative Findings: Acute cholecystitis with distended gallbladder ?  ?Procedure: The patient was taken to the operating room and placed supine. General endotracheal anesthesia was induced. Intravenous antibiotics were administered per protocol. An orogastric tube positioned to decompress the stomach. The abdomen was prepared and draped in the usual sterile fashion.  ?  ?A supraumbilical incision was made and a Veress technique was utilized to achieve pneumoperitoneum to 15 mmHg with carbon dioxide. A 11 mm optiview port was placed through the supraumbilical region, and a 10 mm 0-degree operative laparoscope was introduced. The area underlying the trocar and Veress needle were inspected and without evidence of injury.  Remaining trocars were placed under direct vision. Two 5 mm ports were placed in the right abdomen, between the anterior axillary and midclavicular line.  A final 11 mm port was placed through the mid-epigastrium, near the falciform ligament. The gallbladder was noted to be very distended. ?  ?The gallbladder fundus was elevated cephalad and the infundibulum was retracted to the patient's right. The gallbladder/cystic duct junction was skeletonized. The cystic artery noted in the triangle of Calot and was also skeletonized.  We then continued liberal medial and lateral dissection until the critical view of safety was achieved.  ?  ?The cystic duct and cystic artery were doubly clipped and divided. The gallbladder was then dissected from the liver bed with  electrocautery. The specimen was placed in an Endopouch and was retrieved through the epigastric site. ?  ?Final inspection revealed acceptable hemostasis. Surgical SNOW was placed in the gallbladder bed.  Trocars were removed and pneumoperitoneum was released.  0 Vicryl fascial sutures were used to close the epigastric and umbilical port sites. Skin incisions were closed with 4-0 Monocryl subcuticular sutures and Dermabond. The patient was awakened from anesthesia and extubated without complication.  ?  ?Graciella Freer, DO  ?[facility] ?[place], [place] [address] ?[phone] (office) ? ? ?

## 2021-05-18 NOTE — Interval H&P Note (Signed)
History and Physical Interval Note: ? ?05/18/2021 ?8:57 AM ? ?Amber Church  has presented today for surgery, with the diagnosis of acute cholecystitis and gallstone pancreatitis.  The various methods of treatment have been discussed with the patient and family. After consideration of risks, benefits and other options for treatment, the patient has consented to  Procedure(s): ?LAPAROSCOPIC CHOLECYSTECTOMY (N/A) as a surgical intervention.  The patient's history has been reviewed, patient examined, no change in status, stable for surgery.  I have reviewed the patient's chart and labs.  Questions were answered to the patient's satisfaction.   ? ? ?[name] A Adoria Kawamoto ? ? ?

## 2021-05-18 NOTE — Assessment & Plan Note (Addendum)
--   postop s/p lap chole 05/18/21 ?-- DC home follow up with surgery next week  ?

## 2021-05-18 NOTE — Anesthesia Procedure Notes (Signed)
Procedure Name: Intubation ?Date/Time: 05/18/2021 10:37 AM ?Performed by: Vista Deck, CRNA ?Pre-anesthesia Checklist: Patient identified, Patient being monitored, Timeout performed, Emergency Drugs available and Suction available ?Patient Re-evaluated:Patient Re-evaluated prior to induction ?Oxygen Delivery Method: Circle system utilized ?Preoxygenation: Pre-oxygenation with 100% oxygen ?Induction Type: IV induction ?Ventilation: Mask ventilation without difficulty ?Laryngoscope Size: Mac and 3 ?Grade View: Grade I ?Tube type: Oral ?Tube size: 7.0 mm ?Number of attempts: 1 ?Airway Equipment and Method: Stylet ?Placement Confirmation: ETT inserted through vocal cords under direct vision, positive ETCO2 and breath sounds checked- equal and bilateral ?Secured at: 21 cm ?Tube secured with: Tape ?Dental Injury: Teeth and Oropharynx as per pre-operative assessment  ? ? ? ? ?

## 2021-05-18 NOTE — Progress Notes (Signed)
?PROGRESS NOTE ? ? ?Amber Church  TGG:[id number] DOB: March 26, 1955 DOA: 05/16/2021 ?PCP: Denita Lung, MD  ? ?Chief Complaint  ?Patient presents with  ? Abdominal Pain  ? ?Level of care: Med-Surg ? ?Brief Admission History:  ?66 y/o female with hypertension, hyperlipidemia, GERD, diverticulosis, osteoarthritis, anxiety and depression presented to the emergency department early this morning with symptoms of right upper quadrant abdominal pain associated with nausea and vomiting.  Symptoms started yesterday around noon.  Patient says symptoms have been intermittent lasting approximately 1 hour.  Patient reports that pain returned after eating later in the day and has been severe since that time.  Patient denies shortness of breath chest pain.  Patient denies fever chills diarrhea constipation.  Patient was noted to have elevated LFTs and leukocytosis and sent for right upper quadrant abdominal ultrasound with findings of cholelithiasis but no acute cholecystitis. She does have gallstone pancreatitis with markedly elevated lipase on admission. She was started on supportive measures to calm down the pancreatitis.   Surgery was consulted and recommended admission and possible operative management when medically ready. ? ?05/18/2021: Pt taken to OR today for lap chole ?  ?Assessment and Plan: ?* Symptomatic cholelithiasis ?-- surgery consultation requested  ?-- POD#0 s/p lap chole ?-- routine postop care ?-- clear liquids started  ? ?Acute gallstone pancreatitis ?-- postop day #0 s/p lap chole  ?-- per surgery possible home in 1-2 days ? ?Colicky RUQ abdominal pain ?-- IV pain and nausea medications ordered ?-- General surgery consultation requested  ?-- Pt is postop s/p lap chole 5/12  ? ?Calculus of gallbladder with acute cholecystitis without obstruction ?-- postop s/p lap chole 05/18/21 ? ?Hypomagnesemia ?-- IV replacement ordered and repleted  ? ?Polycythemia ?--  Possibly due to dehydration ?--  Resolved after IV  fluids  ? ?Hyperbilirubinemia ?-- secondary to cholelithiasis, bowel rest ordered and surgery consultation requested ?-- trending down  ?-- s/p lap chole on 5/12 ?-- recheck labs in AM  ? ?Elevated LFTs ?-- trending down  ?-- recheck in AM ? ? ?Leukocytosis ?-- postop s/p lap chole on 5/12 ?-- continue IV antibiotics.  Added metronidazole for better coverage  ?-- recheck CBC/diff in AM  ? ?Spinal stenosis of lumbar region ?-- currently stable, follow  ? ?Obesity (BMI 30-39.9) ?-- stable, BMI 30  ? ?Hyperlipidemia LDL goal <100 ?-- stable, resume home meds tolerating p.o. likely on 5/13 ? ?Hypertension ?-- IV meds ordered while NPO ?-- clears started 5/12, resume home oral meds when tolerating p.o.  ? ? ?DVT prophylaxis: SCDs ?Code Status: full  ?Family Communication:  ?Disposition: Status is: Observation ?The patient remains OBS appropriate and will d/c before 2 midnights. ?  ?Consultants:  ?Surgery  ?Procedures:  ?TBD  ?Antimicrobials:  ?Ceftriaxone 5/10>> ?Metronidazole 5/11>>  ? ?Subjective: ?Pt reports the abdominal pain shooting into back remains severe ?Objective: ?Vitals:  ? 05/18/21 1200 05/18/21 1215 05/18/21 1230 05/18/21 1249  ?BP: 122/75 127/80 127/81 126/79  ?Pulse: 100 94 90 92  ?Resp: '17 17 20 20  '$ ?Temp:    98.6 ?F (37 ?C)  ?TempSrc:    Axillary  ?SpO2: 92% 94% 96% 93%  ?Weight:      ?Height:      ? ? ?Intake/Output Summary (Last 24 hours) at 05/18/2021 1428 ?Last data filed at 05/18/2021 1300 ?Gross per 24 hour  ?Intake 5117.9 ml  ?Output 25 ml  ?Net 5092.9 ml  ? ?Filed Weights  ? 05/16/21 7035 05/16/21 0954  ?Weight: 79.4 kg  79.4 kg  ? ?Examination: ? ?General exam:no apparent distress.  ?Respiratory system: Clear to auscultation. Respiratory effort normal. ?Cardiovascular system: normal S1 & S2 heard. No JVD, murmurs, rubs, gallops or clicks. No pedal edema. ?Gastrointestinal system: Abdomen is very tender in epigastric and RUQ area.  No organomegaly or masses felt. Normal bowel sounds  heard. ?Central nervous system: Alert and oriented. No focal neurological deficits. ?Extremities: Symmetric 5 x 5 power. ?Skin: No rashes, lesions or ulcers. ?Psychiatry: Judgement and insight appear normal. Mood & affect appropriate.  ? ?Data Reviewed: I have personally reviewed following labs and imaging studies ? ?CBC: ?Recent Labs  ?Lab 05/16/21 ?0640 05/17/21 ?0450 05/18/21 ?7741  ?WBC 15.2* 19.6* 24.9*  ?NEUTROABS 13.2* 16.5* 21.2*  ?HGB 15.9* 14.1 13.5  ?HCT 47.7* 42.5 39.5  ?MCV 90.0 89.5 89.8  ?PLT 299 273 258  ? ? ?Basic Metabolic Panel: ?Recent Labs  ?Lab 05/16/21 ?0640 05/17/21 ?0450 05/18/21 ?2878  ?NA 140 137 136  ?K 3.8 3.6 3.3*  ?CL 107 105 102  ?CO2 '23 24 26  '$ ?GLUCOSE 106* 124* 101*  ?BUN '16 15 9  '$ ?CREATININE 0.93 0.79 0.69  ?CALCIUM 9.5 8.2* 7.9*  ?MG  --  1.6* 2.2  ? ? ?CBG: ?No results for input(s): GLUCAP in the last 168 hours. ? ?No results found for this or any previous visit (from the past 240 hour(s)).  ? ?Radiology Studies: ?No results found. ? ?Scheduled Meds: ? acetaminophen  1,000 mg Oral Q6H  ? ?Continuous Infusions: ? cefTRIAXone (ROCEPHIN)  IV 2 g (05/18/21 6767)  ? lactated ringers 175 mL/hr at 05/18/21 1327  ? metronidazole 500 mg (05/17/21 2355)  ? potassium chloride    ? ? LOS: 1 day  ? ?Time spent:25 mins ? ?Irwin Brakeman, MD ?How to contact the [facility] Attending or Consulting provider [place] or covering provider during after hours [place], for this patient?  ?Check the care team in [facility] and look for a) attending/consulting [facility] provider listed and b) the [facility] team listed ?Log into [email or web address] and use [place]'s universal password to access. If you do not have the password, please contact the hospital operator. ?Locate the [facility] provider you are looking for under [facility] and page to a number that you can be directly reached. ?If you still have difficulty reaching the provider, please page the [facility] (Director on Call) for the Hospitalists listed on amion for  assistance. ? ?05/18/2021, 2:28 PM  ? ? ?

## 2021-05-18 NOTE — Transfer of Care (Signed)
Immediate Anesthesia Transfer of Care Note ? ?Patient: Amber Church ? ?Procedure(s) Performed: LAPAROSCOPIC CHOLECYSTECTOMY (Abdomen) ? ?Patient Location: PACU ? ?Anesthesia Type:General ? ?Level of Consciousness: awake and patient cooperative ? ?Airway & Oxygen Therapy: Patient Spontanous Breathing ? ?Post-op Assessment: Report given to RN and Post -op Vital signs reviewed and stable ? ?Post vital signs: Reviewed and stable ? ?Last Vitals:  ?Vitals Value Taken Time  ?BP 131/84 05/18/21 1153  ?Temp 98.2 1156  ?Pulse 102 05/18/21 1155  ?Resp 19 05/18/21 1155  ?SpO2 95 % 05/18/21 1155  ?Vitals shown include unvalidated device data. ? ?Last Pain:  ?Vitals:  ? 05/18/21 1000  ?TempSrc:   ?PainSc: 0-No pain  ?   ? ?Patients Stated Pain Goal: 0 (05/16/21 1800) ? ?Complications: No notable events documented. ?

## 2021-05-19 DIAGNOSIS — K8020 Calculus of gallbladder without cholecystitis without obstruction: Secondary | ICD-10-CM | POA: Diagnosis not present

## 2021-05-19 DIAGNOSIS — K851 Biliary acute pancreatitis without necrosis or infection: Secondary | ICD-10-CM | POA: Diagnosis not present

## 2021-05-19 DIAGNOSIS — R1011 Right upper quadrant pain: Secondary | ICD-10-CM | POA: Diagnosis not present

## 2021-05-19 DIAGNOSIS — R7989 Other specified abnormal findings of blood chemistry: Secondary | ICD-10-CM | POA: Diagnosis not present

## 2021-05-19 LAB — COMPREHENSIVE METABOLIC PANEL
ALT: 104 U/L — ABNORMAL HIGH (ref 0–44)
AST: 85 U/L — ABNORMAL HIGH (ref 15–41)
Albumin: 2.7 g/dL — ABNORMAL LOW (ref 3.5–5.0)
Alkaline Phosphatase: 164 U/L — ABNORMAL HIGH (ref 38–126)
Anion gap: 6 (ref 5–15)
BUN: 8 mg/dL (ref 8–23)
CO2: 27 mmol/L (ref 22–32)
Calcium: 7.4 mg/dL — ABNORMAL LOW (ref 8.9–10.3)
Chloride: 103 mmol/L (ref 98–111)
Creatinine, Ser: 0.63 mg/dL (ref 0.44–1.00)
GFR, Estimated: >60 mL/min (ref >60)
Glucose, Bld: 94 mg/dL (ref 70–99)
Potassium: 3.4 mmol/L — ABNORMAL LOW (ref 3.5–5.1)
Sodium: 136 mmol/L (ref 135–145)
Total Bilirubin: 1.7 mg/dL — ABNORMAL HIGH (ref 0.3–1.2)
Total Protein: 5.6 g/dL — ABNORMAL LOW (ref 6.5–8.1)

## 2021-05-19 LAB — CBC WITH DIFFERENTIAL/PLATELET
Abs Immature Granulocytes: 0.09 10*3/uL — ABNORMAL HIGH (ref 0.00–0.07)
Basophils Absolute: 0 10*3/uL (ref 0.0–0.1)
Basophils Relative: 0 %
Eosinophils Absolute: 0 10*3/uL (ref 0.0–0.5)
Eosinophils Relative: 0 %
HCT: 35 % — ABNORMAL LOW (ref 36.0–46.0)
Hemoglobin: 11.5 g/dL — ABNORMAL LOW (ref 12.0–15.0)
Immature Granulocytes: 1 %
Lymphocytes Relative: 7 %
Lymphs Abs: 1.3 10*3/uL (ref 0.7–4.0)
MCH: 30.1 pg (ref 26.0–34.0)
MCHC: 32.9 g/dL (ref 30.0–36.0)
MCV: 91.6 fL (ref 80.0–100.0)
Monocytes Absolute: 1 10*3/uL (ref 0.1–1.0)
Monocytes Relative: 5 %
Neutro Abs: 15.9 10*3/uL — ABNORMAL HIGH (ref 1.7–7.7)
Neutrophils Relative %: 87 %
Platelets: 248 10*3/uL (ref 150–400)
RBC: 3.82 MIL/uL — ABNORMAL LOW (ref 3.87–5.11)
RDW: 14.9 % (ref 11.5–15.5)
WBC: 18.4 10*3/uL — ABNORMAL HIGH (ref 4.0–10.5)
nRBC: 0 % (ref 0.0–0.2)

## 2021-05-19 LAB — LIPASE, BLOOD: Lipase: 71 U/L — ABNORMAL HIGH (ref 11–51)

## 2021-05-19 MED ORDER — POTASSIUM CHLORIDE 20 MEQ PO PACK
40.0000 meq | PACK | Freq: Once | ORAL | Status: AC
  Administered 2021-05-19: 40 meq via ORAL
  Filled 2021-05-19: qty 2

## 2021-05-19 MED ORDER — BISACODYL 10 MG RE SUPP
10.0000 mg | Freq: Every day | RECTAL | Status: DC
  Administered 2021-05-19 – 2021-05-22 (×4): 10 mg via RECTAL
  Filled 2021-05-19 (×4): qty 1

## 2021-05-19 MED ORDER — SENNOSIDES-DOCUSATE SODIUM 8.6-50 MG PO TABS
2.0000 | ORAL_TABLET | Freq: Two times a day (BID) | ORAL | Status: DC
  Administered 2021-05-19 – 2021-05-22 (×7): 2 via ORAL
  Filled 2021-05-19 (×7): qty 2

## 2021-05-19 MED ORDER — LISINOPRIL 5 MG PO TABS
5.0000 mg | ORAL_TABLET | Freq: Every day | ORAL | Status: DC
  Administered 2021-05-19 – 2021-05-22 (×4): 5 mg via ORAL
  Filled 2021-05-19 (×4): qty 1

## 2021-05-19 MED ORDER — HYDROMORPHONE HCL 1 MG/ML IJ SOLN
1.0000 mg | INTRAMUSCULAR | Status: DC | PRN
  Administered 2021-05-19 – 2021-05-20 (×9): 1 mg via INTRAVENOUS
  Filled 2021-05-19 (×9): qty 1

## 2021-05-19 MED ORDER — FLEET ENEMA 7-19 GM/118ML RE ENEM
1.0000 | ENEMA | Freq: Every day | RECTAL | Status: DC | PRN
  Administered 2021-05-19: 1 via RECTAL

## 2021-05-19 MED ORDER — PRAVASTATIN SODIUM 40 MG PO TABS
40.0000 mg | ORAL_TABLET | Freq: Every day | ORAL | Status: DC
Start: 2021-05-19 — End: 2021-05-22
  Administered 2021-05-19 – 2021-05-21 (×3): 40 mg via ORAL
  Filled 2021-05-19 (×3): qty 1

## 2021-05-19 MED ORDER — POTASSIUM CHLORIDE 10 MEQ/100ML IV SOLN
10.0000 meq | INTRAVENOUS | Status: AC
  Administered 2021-05-19 (×2): 10 meq via INTRAVENOUS
  Filled 2021-05-19 (×3): qty 100

## 2021-05-19 MED ORDER — OXYCODONE HCL 5 MG PO TABS
10.0000 mg | ORAL_TABLET | ORAL | Status: DC | PRN
  Administered 2021-05-19 – 2021-05-22 (×14): 10 mg via ORAL
  Filled 2021-05-19 (×14): qty 2

## 2021-05-19 MED ORDER — POLYETHYLENE GLYCOL 3350 17 G PO PACK
17.0000 g | PACK | Freq: Every day | ORAL | Status: DC
  Administered 2021-05-19 – 2021-05-22 (×4): 17 g via ORAL
  Filled 2021-05-19 (×4): qty 1

## 2021-05-19 NOTE — Progress Notes (Signed)
?PROGRESS NOTE ? ? ?Amber Church  YJE:[id number] DOB: Aug 25, 1955 DOA: 05/16/2021 ?PCP: Denita Lung, MD  ? ?Chief Complaint  ?Patient presents with  ? Abdominal Pain  ? ?Level of care: Med-Surg ? ?Brief Admission History:  ?66 y/o female with hypertension, hyperlipidemia, GERD, diverticulosis, osteoarthritis, anxiety and depression presented to the emergency department early this morning with symptoms of right upper quadrant abdominal pain associated with nausea and vomiting.  Symptoms started yesterday around noon.  Patient says symptoms have been intermittent lasting approximately 1 hour.  Patient reports that pain returned after eating later in the day and has been severe since that time.  Patient denies shortness of breath chest pain.  Patient denies fever chills diarrhea constipation.  Patient was noted to have elevated LFTs and leukocytosis and sent for right upper quadrant abdominal ultrasound with findings of cholelithiasis but no acute cholecystitis. She does have gallstone pancreatitis with markedly elevated lipase on admission. She was started on supportive measures to calm down the pancreatitis.   Surgery was consulted and recommended admission and possible operative management when medically ready. ? ?05/18/2021: Pt taken to OR today for lap chole ?05/19/2021: POD#1 s/p lap chole, feeling better, still having a lot of pain, requiring IV pain meds, full liquid diet today, advance as tolerated.  ?  ?Assessment and Plan: ?* Symptomatic cholelithiasis ?-- surgery consultation requested  ?-- POD#1 s/p lap chole ?-- routine postop care ?-- full liquids, advance as tolerated to soft  ? ?Acute gallstone pancreatitis ?-- postop day #1 s/p lap chole  ? ?Colicky RUQ abdominal pain ?-- IV pain and nausea medications still needed today ?-- General surgery consultation requested  ?-- Pt is postop s/p lap chole 5/12  ? ?Calculus of gallbladder with acute cholecystitis without obstruction ?-- postop s/p lap chole  05/18/21 ? ?Hypomagnesemia ?-- IV replacement ordered and repleted  ? ?Polycythemia ?--  Possibly due to dehydration ?--  Resolved after IV fluids  ? ?Hyperbilirubinemia ?-- secondary to cholelithiasis, bowel rest ordered and surgery consultation requested ?-- trending down overall, slight postop bump  ?-- s/p lap chole on 5/12 ?-- recheck labs in AM  ? ?Elevated LFTs ?-- trending down  ?-- recheck in AM ? ? ?Leukocytosis ?-- postop s/p lap chole on 5/12 ?-- continue IV antibiotics.  Added metronidazole for better coverage  ?-- WBC trending down today.   ?-- recheck CBC/diff in AM  ? ?Spinal stenosis of lumbar region ?-- currently stable, follow  ? ?Obesity (BMI 30-39.9) ?-- stable, BMI 30  ? ?Hyperlipidemia LDL goal <100 ?-- stable, resume home meds 5/13 ? ?Hypertension ?-- IV meds ordered while NPO ?-- clears started 5/12, advanced to full liquids ?-- resume home oral meds today ? ?  ?Consultants:  ?Surgery  ?Procedures:  ?TBD  ?Antimicrobials:  ?Ceftriaxone 5/10>> ?Metronidazole 5/11>>  ? ?Subjective: ?Pt reports still needing IV pain meds.  Tolerating clear liquid diet.  ?Objective: ?Vitals:  ? 05/19/21 0100 05/19/21 0527 05/19/21 1419 05/19/21 1422  ?BP: (!) 141/85 (!) 145/93 (!) 147/102 127/75  ?Pulse: 83 78 78 78  ?Resp: '19 18 18   '$ ?Temp: 98.7 ?F (37.1 ?C) 98.4 ?F (36.9 ?C) 98.2 ?F (36.8 ?C)   ?TempSrc:   Oral   ?SpO2: 97% 92% 92% 90%  ?Weight:      ?Height:      ? ? ?Intake/Output Summary (Last 24 hours) at 05/19/2021 1509 ?Last data filed at 05/19/2021 1400 ?Gross per 24 hour  ?Intake 2941.17 ml  ?Output --  ?  Net 2941.17 ml  ? ?Filed Weights  ? 05/16/21 8119 05/16/21 0954  ?Weight: 79.4 kg 79.4 kg  ? ?Examination: ? ?General exam:no apparent distress. Awake,alert, cooperative.  ?Respiratory system: Clear to auscultation. Respiratory effort normal. ?Cardiovascular system: normal S1 & S2 heard. No JVD, murmurs, rubs, gallops or clicks. No pedal edema. ?Gastrointestinal system: Abdomen is soft, wound clean and  dry.   No organomegaly or masses felt. Normal bowel sounds heard. ?Central nervous system: Alert and oriented. No focal neurological deficits. ?Extremities: Symmetric 5 x 5 power. ?Skin: No rashes, lesions or ulcers. ?Psychiatry: Judgement and insight appear normal. Mood & affect appropriate.  ? ?Data Reviewed: I have personally reviewed following labs and imaging studies ? ?CBC: ?Recent Labs  ?Lab 05/16/21 ?0640 05/17/21 ?0450 05/18/21 ?1478 05/19/21 ?0451  ?WBC 15.2* 19.6* 24.9* 18.4*  ?NEUTROABS 13.2* 16.5* 21.2* 15.9*  ?HGB 15.9* 14.1 13.5 11.5*  ?HCT 47.7* 42.5 39.5 35.0*  ?MCV 90.0 89.5 89.8 91.6  ?PLT 299 273 258 248  ? ? ?Basic Metabolic Panel: ?Recent Labs  ?Lab 05/16/21 ?0640 05/17/21 ?0450 05/18/21 ?2956 05/19/21 ?0451  ?NA 140 137 136 136  ?K 3.8 3.6 3.3* 3.4*  ?CL 107 105 102 103  ?CO2 '23 24 26 27  '$ ?GLUCOSE 106* 124* 101* 94  ?BUN '16 15 9 8  '$ ?CREATININE 0.93 0.79 0.69 0.63  ?CALCIUM 9.5 8.2* 7.9* 7.4*  ?MG  --  1.6* 2.2  --   ? ? ?CBG: ?No results for input(s): GLUCAP in the last 168 hours. ? ?No results found for this or any previous visit (from the past 240 hour(s)).  ? ?Radiology Studies: ?No results found. ? ?Scheduled Meds: ? acetaminophen  1,000 mg Oral Q6H  ? bisacodyl  10 mg Rectal Daily  ? lisinopril  5 mg Oral Daily  ? polyethylene glycol  17 g Oral Daily  ? pravastatin  40 mg Oral q1800  ? senna-docusate  2 tablet Oral BID  ? ?Continuous Infusions: ? cefTRIAXone (ROCEPHIN)  IV 2 g (05/19/21 0915)  ? lactated ringers 70 mL/hr at 05/19/21 1459  ? metronidazole 500 mg (05/19/21 1207)  ? potassium chloride 10 mEq (05/19/21 1502)  ? ? LOS: 2 days  ? ?Time spent:35 mins ? ?Irwin Brakeman, MD ?How to contact the [facility] Attending or Consulting provider [place] or covering provider during after hours [place], for this patient?  ?Check the care team in [facility] and look for a) attending/consulting TRH provider listed and b) the [facility] team listed ?Log into [email or web address] and use [place]'s universal password  to access. If you do not have the password, please contact the hospital operator. ?Locate the [facility] provider you are looking for under [facility] and page to a number that you can be directly reached. ?If you still have difficulty reaching the provider, please page the [facility] (Director on Call) for the Hospitalists listed on amion for assistance. ? ?05/19/2021, 3:09 PM  ? ? ?

## 2021-05-19 NOTE — Progress Notes (Signed)
[facility] Progress Note ? ?1 Day Post-Op  ?Subjective: ?Patient seen and examined.  She is resting comfortably in bed.  Her abdominal pain is improved from yesterday, though she is still needing pain medication to control her pain.  She was able to tolerate her clear liquids without nausea and vomiting, and she is trialed a small amount of full liquids this morning.  She has passed a small amount of flatus but denies any bowel movements. ? ?Objective: ?Vital signs in last 24 hours: ?Temp:  [98.2 ?F (36.8 ?C)-98.9 ?F (37.2 ?C)] 98.4 ?F (36.9 ?C) (05/13 0350) ?Pulse Rate:  [78-107] 78 (05/13 0527) ?Resp:  [17-20] 18 (05/13 0527) ?BP: (111-148)/(70-93) 145/93 (05/13 0527) ?SpO2:  [92 %-97 %] 92 % (05/13 0527) ?  ? ?Intake/Output from previous day: ?05/12 0701 - 05/13 0700 ?In: 3941.2 [P.O.:1440; I.V.:2102.9; IV Piggyback:398.3] ?Out: 25 [Blood:25] ?Intake/Output this shift: ?No intake/output data recorded. ? ?General appearance: alert, cooperative, and no distress ?GI: Abdomen soft, mild distention, appropriate incisional tenderness to palpation, and tenderness in the epigastrium; no rigidity, guarding, rebound tenderness; incisions C/D/I with overlying skin glue in place ? ?Lab Results:  ?Recent Labs  ?  05/18/21 ?0611 05/19/21 ?0451  ?WBC 24.9* 18.4*  ?HGB 13.5 11.5*  ?HCT 39.5 35.0*  ?PLT 258 248  ? ?BMET ?Recent Labs  ?  05/18/21 ?0611 05/19/21 ?0451  ?NA 136 136  ?K 3.3* 3.4*  ?CL 102 103  ?CO2 26 27  ?GLUCOSE 101* 94  ?BUN 9 8  ?CREATININE 0.69 0.63  ?CALCIUM 7.9* 7.4*  ? ?PT/INR ?No results for input(s): LABPROT, INR in the last 72 hours. ? ?Studies/Results: ?No results found. ? ?Anti-infectives: ?Anti-infectives (From admission, onward)  ? ? Start     Dose/Rate Route Frequency Ordered Stop  ? 05/17/21 1230  metroNIDAZOLE (FLAGYL) IVPB 500 mg       ? 500 mg ?100 mL/hr over 60 Minutes Intravenous Every 12 hours 05/17/21 1135    ? 05/17/21 0900  cefTRIAXone (ROCEPHIN) 2 g in sodium chloride  0.9 % 100 mL IVPB       ? 2 g ?200 mL/hr over 30 Minutes Intravenous Every 24 hours 05/16/21 0912    ? 05/16/21 0845  cefTRIAXone (ROCEPHIN) 2 g in sodium chloride 0.9 % 100 mL IVPB       ? 2 g ?200 mL/hr over 30 Minutes Intravenous  Once 05/16/21 0838 05/16/21 1250  ? ?  ? ? ?Assessment/Plan: ? ?Patient is a 66 year old female who was admitted with gallstone pancreatitis.  She is status post laparoscopic cholecystectomy on 5/12. ? ?-Leukocytosis improved to 18.4 from 24.9 yesterday ?-Continue Rocephin and Flagyl ?-Alkaline phosphatase and total bilirubin increased today, ALP 164, T. bili 1.7 ?-We will recheck LFTs tomorrow ?-Continue full liquid diet for lunch, may advance to soft diet for dinner if she tolerates full liquids ?-PRN pain control and antiemetics, advised the patient to trial oral pain medications if her pain is not severe, as this is what she will be discharged home with ?-IV fluids per primary team ?-Bowel regimen ordered ?-Appreciate hospitalist recommendations ? ? LOS: 2 days  ? ? ?Amador Braddy A Nazir Hacker ?05/19/2021 ? ?

## 2021-05-20 DIAGNOSIS — R1011 Right upper quadrant pain: Secondary | ICD-10-CM | POA: Diagnosis not present

## 2021-05-20 DIAGNOSIS — K567 Ileus, unspecified: Secondary | ICD-10-CM | POA: Diagnosis not present

## 2021-05-20 DIAGNOSIS — R7989 Other specified abnormal findings of blood chemistry: Secondary | ICD-10-CM | POA: Diagnosis not present

## 2021-05-20 DIAGNOSIS — K8020 Calculus of gallbladder without cholecystitis without obstruction: Secondary | ICD-10-CM | POA: Diagnosis not present

## 2021-05-20 DIAGNOSIS — K851 Biliary acute pancreatitis without necrosis or infection: Secondary | ICD-10-CM | POA: Diagnosis not present

## 2021-05-20 DIAGNOSIS — K9189 Other postprocedural complications and disorders of digestive system: Secondary | ICD-10-CM | POA: Diagnosis not present

## 2021-05-20 LAB — CBC WITH DIFFERENTIAL/PLATELET
Abs Immature Granulocytes: 0.13 10*3/uL — ABNORMAL HIGH (ref 0.00–0.07)
Basophils Absolute: 0.1 10*3/uL (ref 0.0–0.1)
Basophils Relative: 0 %
Eosinophils Absolute: 0.1 10*3/uL (ref 0.0–0.5)
Eosinophils Relative: 0 %
HCT: 33.2 % — ABNORMAL LOW (ref 36.0–46.0)
Hemoglobin: 10.4 g/dL — ABNORMAL LOW (ref 12.0–15.0)
Immature Granulocytes: 1 %
Lymphocytes Relative: 8 %
Lymphs Abs: 1.5 10*3/uL (ref 0.7–4.0)
MCH: 28.8 pg (ref 26.0–34.0)
MCHC: 31.3 g/dL (ref 30.0–36.0)
MCV: 92 fL (ref 80.0–100.0)
Monocytes Absolute: 1.2 10*3/uL — ABNORMAL HIGH (ref 0.1–1.0)
Monocytes Relative: 6 %
Neutro Abs: 17.3 10*3/uL — ABNORMAL HIGH (ref 1.7–7.7)
Neutrophils Relative %: 85 %
Platelets: 244 10*3/uL (ref 150–400)
RBC: 3.61 MIL/uL — ABNORMAL LOW (ref 3.87–5.11)
RDW: 14.9 % (ref 11.5–15.5)
WBC: 20.3 10*3/uL — ABNORMAL HIGH (ref 4.0–10.5)
nRBC: 0 % (ref 0.0–0.2)

## 2021-05-20 LAB — COMPREHENSIVE METABOLIC PANEL
ALT: 68 U/L — ABNORMAL HIGH (ref 0–44)
AST: 40 U/L (ref 15–41)
Albumin: 2.5 g/dL — ABNORMAL LOW (ref 3.5–5.0)
Alkaline Phosphatase: 127 U/L — ABNORMAL HIGH (ref 38–126)
Anion gap: 5 (ref 5–15)
BUN: 7 mg/dL — ABNORMAL LOW (ref 8–23)
CO2: 27 mmol/L (ref 22–32)
Calcium: 7.3 mg/dL — ABNORMAL LOW (ref 8.9–10.3)
Chloride: 104 mmol/L (ref 98–111)
Creatinine, Ser: 0.52 mg/dL (ref 0.44–1.00)
GFR, Estimated: >60 mL/min (ref >60)
Glucose, Bld: 84 mg/dL (ref 70–99)
Potassium: 3.4 mmol/L — ABNORMAL LOW (ref 3.5–5.1)
Sodium: 136 mmol/L (ref 135–145)
Total Bilirubin: 0.8 mg/dL (ref 0.3–1.2)
Total Protein: 5.3 g/dL — ABNORMAL LOW (ref 6.5–8.1)

## 2021-05-20 LAB — LIPASE, BLOOD: Lipase: 32 U/L (ref 11–51)

## 2021-05-20 MED ORDER — METHOCARBAMOL 500 MG PO TABS
500.0000 mg | ORAL_TABLET | Freq: Four times a day (QID) | ORAL | Status: DC | PRN
  Administered 2021-05-20: 500 mg via ORAL
  Filled 2021-05-20: qty 1

## 2021-05-20 MED ORDER — POTASSIUM CHLORIDE 20 MEQ PO PACK
60.0000 meq | PACK | Freq: Once | ORAL | Status: AC
  Administered 2021-05-20: 60 meq via ORAL
  Filled 2021-05-20: qty 3

## 2021-05-20 MED ORDER — HYDROMORPHONE HCL 1 MG/ML IJ SOLN
1.0000 mg | INTRAMUSCULAR | Status: DC | PRN
  Administered 2021-05-20 (×3): 1 mg via INTRAVENOUS
  Filled 2021-05-20 (×3): qty 1

## 2021-05-20 NOTE — Progress Notes (Signed)
?PROGRESS NOTE ? ? ?Amber Church  [id number]:[id number] DOB: September 08, 1955 DOA: 05/16/2021 ?PCP: Denita Lung, MD  ? ?Chief Complaint  ?Patient presents with  ? Abdominal Pain  ? ?Level of care: Med-Surg ? ?Brief Admission History:  ?66 y/o female with hypertension, hyperlipidemia, GERD, diverticulosis, osteoarthritis, anxiety and depression presented to the emergency department early this morning with symptoms of right upper quadrant abdominal pain associated with nausea and vomiting.  Symptoms started yesterday around noon.  Patient says symptoms have been intermittent lasting approximately 1 hour.  Patient reports that pain returned after eating later in the day and has been severe since that time.  Patient denies shortness of breath chest pain.  Patient denies fever chills diarrhea constipation.  Patient was noted to have elevated LFTs and leukocytosis and sent for right upper quadrant abdominal ultrasound with findings of cholelithiasis but no acute cholecystitis. She does have gallstone pancreatitis with markedly elevated lipase on admission. She was started on supportive measures to calm down the pancreatitis.   Surgery was consulted and recommended admission and possible operative management when medically ready. ? ?05/18/2021: Pt taken to OR today for lap chole ?05/19/2021: POD#1 s/p lap chole, feeling better, still having a lot of pain, requiring IV pain meds, full liquid diet today, advance as tolerated.  ?05/20/2021: Postop ileus developing.  Weaning pain meds. Lipase normalized. WBC bumped today.  Continue IV antibiotics.  ?  ?Assessment and Plan: ?* Symptomatic cholelithiasis ?-- surgery consultation appreciated ?-- POD#2 s/p lap chole ?-- routine postop care ?-- advance as tolerated to soft  ? ?Acute gallstone pancreatitis ?-- postop day #2 s/p lap chole  ?-- lipase normalized now, LFTs coming down ? ?Colicky RUQ abdominal pain ?-- IV pain and nausea medications being weaned  ?-- General surgery  following ?-- Pt is postop s/p lap chole 5/12  ? ?Ileus, postoperative (HCC) ?-- laxatives and suppositories as ordered by surgery ?-- weaning opioids as able  ? ?Calculus of gallbladder with acute cholecystitis without obstruction ?-- postop s/p lap chole 05/18/21 ? ?Hypomagnesemia ?-- IV replacement ordered and repleted  ? ?Polycythemia ?--  Resolved after IV fluids  ? ?Hyperbilirubinemia ?-- secondary to cholelithiasis, bowel rest ordered and surgery consultation requested ?-- trending down overall, slight postop bump  ?-- s/p lap chole on 5/12 ?-- recheck labs in AM  ? ?Elevated LFTs ?-- trending down  ?-- recheck in AM ? ? ?Leukocytosis ?-- postop s/p lap chole on 5/12 ?-- continue IV antibiotics.  Continue metronidazole and ceftriaxone.   ?-- WBC slightly bumped today.   ?-- recheck CBC/diff in AM  ? ?Spinal stenosis of lumbar region ?-- currently stable, follow  ? ?Obesity (BMI 30-39.9) ?-- stable, BMI 30  ? ?Hyperlipidemia LDL goal <100 ?-- stable, resumed home meds  ? ?Hypertension ?-- resumed home oral meds ? ?  ?Consultants:  ?Surgery  ?Procedures:  ?TBD  ?Antimicrobials:  ?Ceftriaxone 5/10>> ?Metronidazole 5/11>>  ? ?Subjective: ?Pt reports no bowel movement.  ?Objective: ?Vitals:  ? 05/19/21 1422 05/19/21 1644 05/19/21 2105 05/20/21 0527  ?BP: 127/75 (!) 152/96 (!) 147/103 (!) 148/78  ?Pulse: 78  81 79  ?Resp:   18 18  ?Temp:   98.1 ?F (36.7 ?C) 98 ?F (36.7 ?C)  ?TempSrc:   Oral Oral  ?SpO2: 90%  93% 93%  ?Weight:      ?Height:      ? ? ?Intake/Output Summary (Last 24 hours) at 05/20/2021 1228 ?Last data filed at 05/20/2021 5449 ?Gross per 24 hour  ?  Intake 5315.76 ml  ?Output --  ?Net 5315.76 ml  ? ?Filed Weights  ? 05/16/21 7681 05/16/21 0954  ?Weight: 79.4 kg 79.4 kg  ? ?Examination: ? ?General exam:no apparent distress. Awake,alert, cooperative.  ?Respiratory system: Clear to auscultation. Respiratory effort normal. ?Cardiovascular system: normal S1 & S2 heard. No JVD, murmurs, rubs, gallops or clicks.  No pedal edema. ?Gastrointestinal system: Abdomen is soft, wound clean and dry.   No organomegaly or masses felt. Normal bowel sounds heard. ?Central nervous system: Alert and oriented. No focal neurological deficits. ?Extremities: Symmetric 5 x 5 power. ?Skin: No rashes, lesions or ulcers. ?Psychiatry: Judgement and insight appear normal. Mood & affect appropriate.  ? ?Data Reviewed: I have personally reviewed following labs and imaging studies ? ?CBC: ?Recent Labs  ?Lab 05/16/21 ?0640 05/17/21 ?0450 05/18/21 ?1572 05/19/21 ?0451 05/20/21 ?6203  ?WBC 15.2* 19.6* 24.9* 18.4* 20.3*  ?NEUTROABS 13.2* 16.5* 21.2* 15.9* 17.3*  ?HGB 15.9* 14.1 13.5 11.5* 10.4*  ?HCT 47.7* 42.5 39.5 35.0* 33.2*  ?MCV 90.0 89.5 89.8 91.6 92.0  ?PLT 299 273 258 248 244  ? ? ?Basic Metabolic Panel: ?Recent Labs  ?Lab 05/16/21 ?0640 05/17/21 ?0450 05/18/21 ?5597 05/19/21 ?0451 05/20/21 ?4163  ?NA 140 137 136 136 136  ?K 3.8 3.6 3.3* 3.4* 3.4*  ?CL 107 105 102 103 104  ?CO2 '23 24 26 27 27  '$ ?GLUCOSE 106* 124* 101* 94 84  ?BUN '16 15 9 8 '$ 7*  ?CREATININE 0.93 0.79 0.69 0.63 0.52  ?CALCIUM 9.5 8.2* 7.9* 7.4* 7.3*  ?MG  --  1.6* 2.2  --   --   ? ? ?CBG: ?No results for input(s): GLUCAP in the last 168 hours. ? ?No results found for this or any previous visit (from the past 240 hour(s)).  ? ?Radiology Studies: ?No results found. ? ?Scheduled Meds: ? acetaminophen  1,000 mg Oral Q6H  ? bisacodyl  10 mg Rectal Daily  ? lisinopril  5 mg Oral Daily  ? polyethylene glycol  17 g Oral Daily  ? potassium chloride  60 mEq Oral Once  ? pravastatin  40 mg Oral q1800  ? senna-docusate  2 tablet Oral BID  ? ?Continuous Infusions: ? cefTRIAXone (ROCEPHIN)  IV 2 g (05/20/21 0843)  ? lactated ringers 70 mL/hr at 05/20/21 8453  ? metronidazole 500 mg (05/20/21 1125)  ? ? LOS: 3 days  ? ?Time spent:35 mins ? ?Irwin Brakeman, MD ?How to contact the [facility] Attending or Consulting provider [place] or covering provider during after hours [place], for this patient?  ?Check  the care team in [facility] and look for a) attending/consulting TRH provider listed and b) the [facility] team listed ?Log into [email or web address] and use [place]'s universal password to access. If you do not have the password, please contact the hospital operator. ?Locate the [facility] provider you are looking for under [facility] and page to a number that you can be directly reached. ?If you still have difficulty reaching the provider, please page the [facility] (Director on Call) for the Hospitalists listed on amion for assistance. ? ?05/20/2021, 12:28 PM  ? ? ?

## 2021-05-20 NOTE — Progress Notes (Signed)
[facility] Progress Note ? ?2 Days Post-Op  ?Subjective: ?Patient seen and examined.  She is resting comfortably in bed.  She is tolerating her full liquid diet without nausea and vomiting, though states that she gets full quickly.  She denies passing significant amount of flatus.  She had a small amount of flatus and a small bowel movement after receiving enema yesterday.  She denies fevers and chills.  Her abdominal pain is improved, though she is still requiring IV and oral pain medications to control her pain.  Afebrile. ? ?Objective: ?Vital signs in last 24 hours: ?Temp:  [98 ?F (36.7 ?C)-98.2 ?F (36.8 ?C)] 98 ?F (36.7 ?C) (05/14 0527) ?Pulse Rate:  [78-81] 79 (05/14 0527) ?Resp:  [18] 18 (05/14 0527) ?BP: (127-152)/(75-103) 148/78 (05/14 0527) ?SpO2:  [90 %-93 %] 93 % (05/14 0527) ?Last BM Date : 05/19/21 ? ?Intake/Output from previous day: ?05/13 0701 - 05/14 0700 ?In: 5490.8 [P.O.:920; I.V.:4070.7; IV Piggyback:500] ?Out: -  ?Intake/Output this shift: ?No intake/output data recorded. ? ?General appearance: alert, cooperative, and no distress ?GI: Abdomen soft, moderate distention, no percussion tenderness, nontender to palpation; no rigidity, guarding, rebound tenderness; incisions C/D/I with Dermabond in place, mild ecchymosis around incision sites ? ?Lab Results:  ?Recent Labs  ?  05/19/21 ?0451 05/20/21 ?4008  ?WBC 18.4* 20.3*  ?HGB 11.5* 10.4*  ?HCT 35.0* 33.2*  ?PLT 248 244  ? ?BMET ?Recent Labs  ?  05/19/21 ?0451 05/20/21 ?6761  ?NA 136 136  ?K 3.4* 3.4*  ?CL 103 104  ?CO2 27 27  ?GLUCOSE 94 84  ?BUN 8 7*  ?CREATININE 0.63 0.52  ?CALCIUM 7.4* 7.3*  ? ?PT/INR ?No results for input(s): LABPROT, INR in the last 72 hours. ? ?Studies/Results: ?No results found. ? ?Anti-infectives: ?Anti-infectives (From admission, onward)  ? ? Start     Dose/Rate Route Frequency Ordered Stop  ? 05/17/21 1230  metroNIDAZOLE (FLAGYL) IVPB 500 mg       ? 500 mg ?100 mL/hr over 60 Minutes Intravenous Every  12 hours 05/17/21 1135    ? 05/17/21 0900  cefTRIAXone (ROCEPHIN) 2 g in sodium chloride 0.9 % 100 mL IVPB       ? 2 g ?200 mL/hr over 30 Minutes Intravenous Every 24 hours 05/16/21 0912    ? 05/16/21 0845  cefTRIAXone (ROCEPHIN) 2 g in sodium chloride 0.9 % 100 mL IVPB       ? 2 g ?200 mL/hr over 30 Minutes Intravenous  Once 05/16/21 0838 05/16/21 1250  ? ?  ? ? ?Assessment/Plan: ? ?Patient is 66 year old female who was admitted with gallstone pancreatitis.  She is status post laparoscopic cholecystectomy on 5/12. ? ?-Leukocytosis increased to 20.3 from 18.4 yesterday, will continue to monitor ?-Continue Rocephin and Flagyl ?-ALP and T. bili improving today, T. bili normalized at 0.8 ?-Advance to GI soft diet ?-Patient with postoperative ileus, likely from pancreatitis and opioid use ?-Continue bowel regimen ?-Advised patient to only use pain medications when her abdominal pain is severe, as this is likely worsening her ileus ?-Robaxin added to pain regimen ?-Encourage ambulation around the floor ?-Encourage incentive spirometry ?-IV fluids per primary team ?Appreciate hospitalist recommendations ? ? LOS: 3 days  ? ? ?Amber Church A [name] ?05/20/2021 ? ?

## 2021-05-20 NOTE — Assessment & Plan Note (Addendum)
--   laxatives and suppositories as ordered by surgery ?-- Pt having multiple bowel movements now  ?

## 2021-05-21 ENCOUNTER — Encounter (HOSPITAL_COMMUNITY): Payer: Self-pay | Admitting: Surgery

## 2021-05-21 DIAGNOSIS — K851 Biliary acute pancreatitis without necrosis or infection: Secondary | ICD-10-CM | POA: Diagnosis not present

## 2021-05-21 LAB — COMPREHENSIVE METABOLIC PANEL
ALT: 51 U/L — ABNORMAL HIGH (ref 0–44)
AST: 24 U/L (ref 15–41)
Albumin: 2.4 g/dL — ABNORMAL LOW (ref 3.5–5.0)
Alkaline Phosphatase: 132 U/L — ABNORMAL HIGH (ref 38–126)
Anion gap: 6 (ref 5–15)
BUN: 6 mg/dL — ABNORMAL LOW (ref 8–23)
CO2: 29 mmol/L (ref 22–32)
Calcium: 7.4 mg/dL — ABNORMAL LOW (ref 8.9–10.3)
Chloride: 102 mmol/L (ref 98–111)
Creatinine, Ser: 0.59 mg/dL (ref 0.44–1.00)
GFR, Estimated: >60 mL/min (ref >60)
Glucose, Bld: 101 mg/dL — ABNORMAL HIGH (ref 70–99)
Potassium: 3.6 mmol/L (ref 3.5–5.1)
Sodium: 137 mmol/L (ref 135–145)
Total Bilirubin: 0.9 mg/dL (ref 0.3–1.2)
Total Protein: 5.6 g/dL — ABNORMAL LOW (ref 6.5–8.1)

## 2021-05-21 LAB — CBC WITH DIFFERENTIAL/PLATELET
Abs Immature Granulocytes: 0.18 10*3/uL — ABNORMAL HIGH (ref 0.00–0.07)
Basophils Absolute: 0.1 10*3/uL (ref 0.0–0.1)
Basophils Relative: 0 %
Eosinophils Absolute: 0.1 10*3/uL (ref 0.0–0.5)
Eosinophils Relative: 1 %
HCT: 33.4 % — ABNORMAL LOW (ref 36.0–46.0)
Hemoglobin: 10.6 g/dL — ABNORMAL LOW (ref 12.0–15.0)
Immature Granulocytes: 1 %
Lymphocytes Relative: 9 %
Lymphs Abs: 1.8 10*3/uL (ref 0.7–4.0)
MCH: 29 pg (ref 26.0–34.0)
MCHC: 31.7 g/dL (ref 30.0–36.0)
MCV: 91.3 fL (ref 80.0–100.0)
Monocytes Absolute: 1.3 10*3/uL — ABNORMAL HIGH (ref 0.1–1.0)
Monocytes Relative: 6 %
Neutro Abs: 17.6 10*3/uL — ABNORMAL HIGH (ref 1.7–7.7)
Neutrophils Relative %: 83 %
Platelets: 281 10*3/uL (ref 150–400)
RBC: 3.66 MIL/uL — ABNORMAL LOW (ref 3.87–5.11)
RDW: 14.9 % (ref 11.5–15.5)
WBC: 21.1 10*3/uL — ABNORMAL HIGH (ref 4.0–10.5)
nRBC: 0 % (ref 0.0–0.2)

## 2021-05-21 LAB — MAGNESIUM: Magnesium: 2 mg/dL (ref 1.7–2.4)

## 2021-05-21 LAB — SURGICAL PATHOLOGY

## 2021-05-21 MED ORDER — ACETAMINOPHEN 325 MG PO TABS
650.0000 mg | ORAL_TABLET | Freq: Four times a day (QID) | ORAL | Status: DC
  Administered 2021-05-21 – 2021-05-22 (×4): 650 mg via ORAL
  Filled 2021-05-21 (×4): qty 2

## 2021-05-21 MED ORDER — PIPERACILLIN-TAZOBACTAM 3.375 G IVPB: 3.3750 g | Freq: Three times a day (TID) | INTRAVENOUS | Status: DC

## 2021-05-21 MED ORDER — PIPERACILLIN-TAZOBACTAM 3.375 G IVPB
3.3750 g | Freq: Once | INTRAVENOUS | Status: AC
  Administered 2021-05-21: 3.375 g via INTRAVENOUS
  Filled 2021-05-21: qty 50

## 2021-05-21 MED ORDER — MILK AND MOLASSES ENEMA
1.0000 | Freq: Once | RECTAL | Status: AC
  Administered 2021-05-21: 240 mL via RECTAL

## 2021-05-21 MED ORDER — HYDROMORPHONE HCL 1 MG/ML IJ SOLN: 0.2500 mg | INTRAMUSCULAR | Status: DC | PRN

## 2021-05-21 NOTE — Progress Notes (Signed)
?PROGRESS NOTE ? ? ?Amber Church  YDX:[id number] DOB: [birth year] DOA: 05/16/2021 ?PCP: Denita Lung, MD  ? ?Chief Complaint  ?Patient presents with  ? Abdominal Pain  ? ?Level of care: Med-Surg ? ?Brief Admission History:  ?66 y/o female with hypertension, hyperlipidemia, GERD, diverticulosis, osteoarthritis, anxiety and depression presented to the emergency department early this morning with symptoms of right upper quadrant abdominal pain associated with nausea and vomiting.  Symptoms started yesterday around noon.  Patient says symptoms have been intermittent lasting approximately 1 hour.  Patient reports that pain returned after eating later in the day and has been severe since that time.  Patient denies shortness of breath chest pain.  Patient denies fever chills diarrhea constipation.  Patient was noted to have elevated LFTs and leukocytosis and sent for right upper quadrant abdominal ultrasound with findings of cholelithiasis but no acute cholecystitis. She does have gallstone pancreatitis with markedly elevated lipase on admission. She was started on supportive measures to calm down the pancreatitis.   Surgery was consulted and recommended admission and possible operative management when medically ready. ? ?05/18/2021: Pt taken to OR today for lap chole ?05/19/2021: POD#1 s/p lap chole, feeling better, still having a lot of pain, requiring IV pain meds, full liquid diet today, advance as tolerated.  ?05/20/2021: Postop ileus developing.  Weaning pain meds. Lipase normalized. WBC bumped today.  Continue IV antibiotics.  ?05/21/2021: WBC bumped up to 21. Less pain med usage. Eating diet. No BM.  ?  ?Assessment and Plan: ?* Symptomatic cholelithiasis ?-- surgery consultation appreciated ?-- POD#3 s/p lap chole ?-- routine postop care ?-- tolerating soft diet.  Continue ambulating the halls.  ? ?Acute gallstone pancreatitis ?-- postop day #3 s/p lap chole  ?-- lipase normalized now, LFTs coming down ? ?Colicky  RUQ abdominal pain ?-- resolved now after lap chole ?-- General surgery following ?-- Pt is postop s/p lap chole 5/12  ? ?Ileus, postoperative (HCC) ?-- laxatives and suppositories as ordered by surgery ?-- weaning opioids ? ?Calculus of gallbladder with acute cholecystitis without obstruction ?-- postop s/p lap chole 05/18/21 ? ?Hypomagnesemia ?-- IV replacement ordered and repleted  ? ?Polycythemia ?--  Resolved after IV fluids  ? ?Hyperbilirubinemia ?-- secondary to cholelithiasis, bowel rest ordered and surgery consultation requested ?-- trending down overall, slight postop bump  ?-- s/p lap chole on 5/12 ?-- recheck labs in AM  ? ?Elevated LFTs ?-- trending down post lap chole ? ? ?Leukocytosis ?-- postop s/p lap chole on 5/12 ?-- continue IV antibiotics.  Initially treated with metronidazole and ceftriaxone.  Changed to zosyn on 5/15 ?-- WBC slightly bumped to 21.   ?-- recheck CBC/diff in AM  ? ?Spinal stenosis of lumbar region ?-- currently stable, follow  ? ?Obesity (BMI 30-39.9) ?-- stable, BMI 30  ? ?Hyperlipidemia LDL goal <100 ?-- stable, resumed home meds  ? ?Hypertension ?-- resumed home oral meds ? ?  ?Consultants:  ?Surgery  ?Procedures:  ?TBD  ?Antimicrobials:  ?Ceftriaxone 5/10>> ?Metronidazole 5/11>>  ? ?Subjective: ?Pt reports she is tolerating diet but feels bloated and no BM.  Using less pain meds.  ?Objective: ?Vitals:  ? 05/20/21 1427 05/20/21 2133 05/21/21 0451 05/21/21 7209  ?BP: (!) 152/90 (!) 155/102 (!) 153/91 (!) 144/87  ?Pulse: 81 79 71 86  ?Resp: '17 18 18   '$ ?Temp:  98.3 ?F (36.8 ?C) 98.4 ?F (36.9 ?C)   ?TempSrc:  Oral Oral   ?SpO2: 94% 96% 96%   ?Weight:      ?  Height:      ? ? ?Intake/Output Summary (Last 24 hours) at 05/21/2021 1039 ?Last data filed at 05/21/2021 0600 ?Gross per 24 hour  ?Intake 2624.11 ml  ?Output --  ?Net 2624.11 ml  ? ?Filed Weights  ? 05/16/21 6160 05/16/21 0954  ?Weight: 79.4 kg 79.4 kg  ? ?Examination: ? ?General exam:no apparent distress. Awake,alert,  cooperative.  ?Respiratory system: Clear to auscultation. Respiratory effort normal. ?Cardiovascular system: normal S1 & S2 heard. No JVD, murmurs, rubs, gallops or clicks. No pedal edema. ?Gastrointestinal system: Abdomen is soft, wound clean and dry.   No organomegaly or masses felt. Normal bowel sounds heard. ?Central nervous system: Alert and oriented. No focal neurological deficits. ?Extremities: Symmetric 5 x 5 power. ?Skin: No rashes, lesions or ulcers. ?Psychiatry: Judgement and insight appear normal. Mood & affect appropriate.  ? ?Data Reviewed: I have personally reviewed following labs and imaging studies ? ?CBC: ?Recent Labs  ?Lab 05/17/21 ?0450 05/18/21 ?0611 05/19/21 ?0451 05/20/21 ?7371 05/21/21 ?0446  ?WBC 19.6* 24.9* 18.4* 20.3* 21.1*  ?NEUTROABS 16.5* 21.2* 15.9* 17.3* 17.6*  ?HGB 14.1 13.5 11.5* 10.4* 10.6*  ?HCT 42.5 39.5 35.0* 33.2* 33.4*  ?MCV 89.5 89.8 91.6 92.0 91.3  ?PLT 273 258 248 244 281  ? ? ?Basic Metabolic Panel: ?Recent Labs  ?Lab 05/17/21 ?0450 05/18/21 ?0611 05/19/21 ?0451 05/20/21 ?0626 05/21/21 ?0446  ?NA 137 136 136 136 137  ?K 3.6 3.3* 3.4* 3.4* 3.6  ?CL 105 102 103 104 102  ?CO2 '24 26 27 27 29  '$ ?GLUCOSE 124* 101* 94 84 101*  ?BUN '15 9 8 '$ 7* 6*  ?CREATININE 0.79 0.69 0.63 0.52 0.59  ?CALCIUM 8.2* 7.9* 7.4* 7.3* 7.4*  ?MG 1.6* 2.2  --   --  2.0  ? ? ?CBG: ?No results for input(s): GLUCAP in the last 168 hours. ? ?No results found for this or any previous visit (from the past 240 hour(s)).  ? ?Radiology Studies: ?No results found. ? ?Scheduled Meds: ? acetaminophen  650 mg Oral Q6H  ? bisacodyl  10 mg Rectal Daily  ? lisinopril  5 mg Oral Daily  ? polyethylene glycol  17 g Oral Daily  ? pravastatin  40 mg Oral q1800  ? senna-docusate  2 tablet Oral BID  ? ?Continuous Infusions: ? lactated ringers 70 mL/hr at 05/20/21 2218  ? piperacillin-tazobactam (ZOSYN)  IV    ? ? LOS: 4 days  ? ?Time spent:35 mins ? ?Irwin Brakeman, MD ?How to contact the [facility] Attending or Consulting provider  [place] or covering provider during after hours [place], for this patient?  ?Check the care team in [facility] and look for a) attending/consulting [facility] provider listed and b) the [facility] team listed ?Log into [email or web address] and use [facility] universal password to access. If you do not have the password, please contact the hospital operator. ?Locate the [facility] provider you are looking for under [facility] and page to a number that you can be directly reached. ?If you still have difficulty reaching the provider, please page the [facility] (Director on Call) for the Hospitalists listed on amion for assistance. ? ?05/21/2021, 10:39 AM  ? ? ?

## 2021-05-21 NOTE — Progress Notes (Signed)
Pharmacy Antibiotic Note ? ?Amber Church is a 66 y.o. female admitted on 05/16/2021 with  intra-abdominal infection .  Pharmacy has been consulted for Zosyn dosing. ? ?Plan: ?Zosyn 3.375g IV q8h (4 hour infusion). ?Monitor labs, c/s, and patient improvement. ? ?Height: '5\' 4"'$  (162.6 cm) ?Weight: 79.4 kg (175 lb 0.7 oz) ?IBW/kg (Calculated) : 54.7 ? ?Temp (24hrs), Avg:98.4 ?F (36.9 ?C), Min:98.3 ?F (36.8 ?C), Max:98.4 ?F (36.9 ?C) ? ?Recent Labs  ?Lab 05/17/21 ?0450 05/18/21 ?0611 05/19/21 ?0451 05/20/21 ?8563 05/21/21 ?0446  ?WBC 19.6* 24.9* 18.4* 20.3* 21.1*  ?CREATININE 0.79 0.69 0.63 0.52 0.59  ?  ?Estimated Creatinine Clearance: 72.5 mL/min (by C-G formula based on SCr of 0.59 mg/dL).   ? ?No Known Allergies ? ?Antimicrobials this admission: ?Zosyn 5/15 >> ?CTX/Flagyl 5/11 >> 5/15  ? ?Microbiology results: ?None pending ? ?Thank you for allowing pharmacy to be a part of this patient?s care. ? ?Ramond Craver ?05/21/2021 8:30 AM ? ?

## 2021-05-21 NOTE — Progress Notes (Signed)
3 Days Post-Op  ?Subjective: ?Patient still with no bowel movement yet.  She is starting to pass flatus.  She states her normal routine is having a bowel movement daily.  She is anxious about the fact that she has not had a bowel movement yet.  She has minimal abdominal pain.  She states that sometimes she feels full soon after eating. ? ?Objective: ?Vital signs in last 24 hours: ?Temp:  [98.3 ?F (36.8 ?C)-98.4 ?F (36.9 ?C)] 98.4 ?F (36.9 ?C) (05/15 0451) ?Pulse Rate:  [71-86] 86 (05/15 0611) ?Resp:  [17-18] 18 (05/15 0451) ?BP: (144-155)/(87-102) 144/87 (05/15 3545) ?SpO2:  [94 %-96 %] 96 % (05/15 0451) ?Last BM Date : 05/19/21 ? ?Intake/Output from previous day: ?05/14 0701 - 05/15 0700 ?In: 2689.1 [P.O.:905; I.V.:1484.1; IV [id number]:342] ?Out: -  ?Intake/Output this shift: ?No intake/output data recorded. ? ?General appearance: alert, cooperative, and no distress ?GI: Soft, minimally distended.  Incisions healing well.  Bowel sounds are present. ? ?Lab Results:  ?Recent Labs  ?  05/20/21 ?8768 05/21/21 ?0446  ?WBC 20.3* 21.1*  ?HGB 10.4* 10.6*  ?HCT 33.2* 33.4*  ?PLT 244 281  ? ?BMET ?Recent Labs  ?  05/20/21 ?1157 05/21/21 ?0446  ?NA 136 137  ?K 3.4* 3.6  ?CL 104 102  ?CO2 27 29  ?GLUCOSE 84 101*  ?BUN 7* 6*  ?CREATININE 0.52 0.59  ?CALCIUM 7.3* 7.4*  ? ?PT/INR ?No results for input(s): LABPROT, INR in the last 72 hours. ? ?Studies/Results: ?No results found. ? ?Anti-infectives: ?Anti-infectives (From admission, onward)  ? ? Start     Dose/Rate Route Frequency Ordered Stop  ? 05/21/21 1600  piperacillin-tazobactam (ZOSYN) IVPB 3.375 g  Status:  Discontinued       ? 3.375 g ?12.5 mL/hr over 240 Minutes Intravenous Every 8 hours 05/21/21 0829 05/21/21 1048  ? 05/21/21 0845  piperacillin-tazobactam (ZOSYN) IVPB 3.375 g       ? 3.375 g ?100 mL/hr over 30 Minutes Intravenous  Once 05/21/21 0750 05/21/21 0928  ? 05/17/21 1230  metroNIDAZOLE (FLAGYL) IVPB 500 mg  Status:  Discontinued       ? 500 mg ?100 mL/hr over 60  Minutes Intravenous Every 12 hours 05/17/21 1135 05/21/21 0739  ? 05/17/21 0900  cefTRIAXone (ROCEPHIN) 2 g in sodium chloride 0.9 % 100 mL IVPB  Status:  Discontinued       ? 2 g ?200 mL/hr over 30 Minutes Intravenous Every 24 hours 05/16/21 0912 05/21/21 0739  ? 05/16/21 0845  cefTRIAXone (ROCEPHIN) 2 g in sodium chloride 0.9 % 100 mL IVPB       ? 2 g ?200 mL/hr over 30 Minutes Intravenous  Once 05/16/21 0838 05/16/21 1250  ? ?  ? ? ?Assessment/Plan: ?s/p Procedure(s): ?LAPAROSCOPIC CHOLECYSTECTOMY ?Impression: Postoperative ileus most likely secondary to her gallstone pancreatitis, narcotic use, and recent surgery.  She has a leukocytosis of unknown etiology.  This is day 5 of her antibiotic regimen.  Would consider stopping antibiotics for 24 hours and recheck white blood cell count in a.m.  This was discussed with Dr. Wynetta Emery.  Will write for milk of molasses enema. ? LOS: 4 days  ? ? ?Aviva Signs ?05/21/2021  ?

## 2021-05-22 DIAGNOSIS — K851 Biliary acute pancreatitis without necrosis or infection: Secondary | ICD-10-CM | POA: Diagnosis not present

## 2021-05-22 DIAGNOSIS — R1011 Right upper quadrant pain: Secondary | ICD-10-CM | POA: Diagnosis not present

## 2021-05-22 DIAGNOSIS — K8000 Calculus of gallbladder with acute cholecystitis without obstruction: Secondary | ICD-10-CM | POA: Diagnosis not present

## 2021-05-22 DIAGNOSIS — I10 Essential (primary) hypertension: Secondary | ICD-10-CM

## 2021-05-22 DIAGNOSIS — K8020 Calculus of gallbladder without cholecystitis without obstruction: Secondary | ICD-10-CM | POA: Diagnosis not present

## 2021-05-22 LAB — CBC WITH DIFFERENTIAL/PLATELET
Abs Immature Granulocytes: 0.68 10*3/uL — ABNORMAL HIGH (ref 0.00–0.07)
Basophils Absolute: 0.1 10*3/uL (ref 0.0–0.1)
Basophils Relative: 1 %
Eosinophils Absolute: 0.1 10*3/uL (ref 0.0–0.5)
Eosinophils Relative: 1 %
HCT: 36.4 % (ref 36.0–46.0)
Hemoglobin: 11.8 g/dL — ABNORMAL LOW (ref 12.0–15.0)
Immature Granulocytes: 4 %
Lymphocytes Relative: 12 %
Lymphs Abs: 2.1 10*3/uL (ref 0.7–4.0)
MCH: 29.5 pg (ref 26.0–34.0)
MCHC: 32.4 g/dL (ref 30.0–36.0)
MCV: 91 fL (ref 80.0–100.0)
Monocytes Absolute: 1.2 10*3/uL — ABNORMAL HIGH (ref 0.1–1.0)
Monocytes Relative: 7 %
Neutro Abs: 13.1 10*3/uL — ABNORMAL HIGH (ref 1.7–7.7)
Neutrophils Relative %: 75 %
Platelets: 324 10*3/uL (ref 150–400)
RBC: 4 MIL/uL (ref 3.87–5.11)
RDW: 15.3 % (ref 11.5–15.5)
WBC: 17.4 10*3/uL — ABNORMAL HIGH (ref 4.0–10.5)
nRBC: 0.1 % (ref 0.0–0.2)

## 2021-05-22 MED ORDER — IBUPROFEN 800 MG PO TABS
800.0000 mg | ORAL_TABLET | Freq: Three times a day (TID) | ORAL | Status: DC | PRN
Start: 2021-05-22 — End: 2021-06-03

## 2021-05-22 MED ORDER — DICLOFENAC SODIUM 1 % EX GEL: 2.0000 g | Freq: Four times a day (QID) | CUTANEOUS | Status: DC | PRN

## 2021-05-22 MED ORDER — SENNOSIDES-DOCUSATE SODIUM 8.6-50 MG PO TABS: 2.0000 | ORAL_TABLET | Freq: Every day | ORAL | 0 refills | Status: DC

## 2021-05-22 MED ORDER — OXYCODONE HCL 5 MG PO TABS: 5.0000 mg | ORAL_TABLET | Freq: Three times a day (TID) | ORAL | 0 refills | Status: DC | PRN

## 2021-05-22 NOTE — Discharge Instructions (Addendum)
Surgery Discharge Instructions ? ?Activity ? You are advised to go directly home from the hospital.  Resume light activity. No heavy lifting over 10 lbs or strenuous exercise. ? ?Fluids and Diet ?Regular diet ? ?Medications ? If you have not had a bowel movement in 24 hours, take 2 tablespoons over the counter Milk of mag. ?            You May resume your blood thinners tomorrow (Aspirin, coumadin, or other).  ?You are being discharged with prescriptions for Opioid/Narcotic Medications: There are some specific considerations for these medications that you should know. ?Opioid Meds have risks & benefits. Addiction to these meds is always a concern with prolonged use ?Take medication only as directed ?Do not drive while taking narcotic pain medication ?Do not crush tablets or capsules ?Do not use a different container than medication was dispensed in ?Lock the container of medication in a cool, dry place out of reach of children and pets. ?Opioid medication can cause addiction ?Do not share with anyone else (this is a felony) ?Do not store medications for future use. Dispose of them properly. ?    Disposal:  ?Find a [employer] household drug take back site near you.  ?If you can't get to a drug take back site, use the recipe below as a last resort to dispose of expired, unused or unwanted drugs. ?Disposal  ?(Do not dispose chemotherapy drugs this way, talk to your prescribing doctor instead.) Step 1: Mix drugs (do not crush) with dirt, kitty litter, or used coffee grounds and add a small amount of water to dissolve any solid medications. Step 2: Seal drugs in plastic bag. Step 3: Place plastic bag in trash. Step 4: Take prescription container and scratch out personal information, then recycle or throw away. ? ?Operative Site ? You have a liquid bandage over your incisions, this will begin to flake off in about a week. Ok to Games developer. Keep wound clean and dry. No baths or swimming. No lifting more than 10  pounds. ? ?Contact Information: ?If you have questions or concerns, please call our office, [phone], Monday- Thursday 8AM-5PM and Friday 8AM-12Noon.  ?If it is after hours or on the weekend, please call [facility] Main Number, [phone], and ask to speak to the surgeon on call for Dr. Okey Dupre at [facility].  ? ?SPECIFIC COMPLICATIONS TO WATCH FOR: ?Inability to urinate ?Fever over 101? F by mouth ?Nausea and vomiting lasting longer than 24 hours. ?Pain not relieved by medication ordered ?Swelling around the operative site ?Increased redness, warmth, hardness, around operative area ?Numbness, tingling, or cold fingers or toes ?Blood -soaked dressing, (small amounts of oozing may be normal) ?Increasing and progressive drainage from surgical area or exam site ? ? ? ? ?IMPORTANT INFORMATION: PAY CLOSE ATTENTION  ? ?PHYSICIAN DISCHARGE INSTRUCTIONS ? ?Follow with Primary care provider  Amber Lung, MD  and other consultants as instructed by your Hospitalist Physician ? ?SEEK MEDICAL CARE OR RETURN TO EMERGENCY ROOM IF SYMPTOMS COME BACK, WORSEN OR NEW PROBLEM DEVELOPS  ? ?Please note: ?You were cared for by a hospitalist during your hospital stay. Every effort will be made to forward records to your primary care provider.  You can request that your primary care provider send for your hospital records if they have not received them.  Once you are discharged, your primary care physician will handle any further medical issues. Please note that NO REFILLS for any discharge medications will be authorized once you  are discharged, as it is imperative that you return to your primary care physician (or establish a relationship with a primary care physician if you do not have one) for your post hospital discharge needs so that they can reassess your need for medications and monitor your lab values. ? ?Please get a complete blood count and chemistry panel checked by your Primary MD at your next visit, and again as  instructed by your Primary MD. ? ?Get Medicines reviewed and adjusted: ?Please take all your medications with you for your next visit with your Primary MD ? ?Laboratory/radiological data: ?Please request your Primary MD to go over all hospital tests and procedure/radiological results at the follow up, please ask your primary care provider to get all Hospital records sent to his/her office. ? ?In some cases, they will be blood work, cultures and biopsy results pending at the time of your discharge. Please request that your primary care provider follow up on these results. ? ?If you are diabetic, please bring your blood sugar readings with you to your follow up appointment with primary care.   ? ?Please call and make your follow up appointments as soon as possible.   ? ?Also Note the following: ?If you experience worsening of your admission symptoms, develop shortness of breath, life threatening emergency, suicidal or homicidal thoughts you must seek medical attention immediately by calling 911 or calling your MD immediately  if symptoms less severe. ? ?You must read complete instructions/literature along with all the possible adverse reactions/side effects for all the Medicines you take and that have been prescribed to you. Take any new Medicines after you have completely understood and accpet all the possible adverse reactions/side effects.  ? ?Do not drive when taking Pain medications or sleeping medications (Benzodiazepines) ? ?Do not take more than prescribed Pain, Sleep and Anxiety Medications. It is not advisable to combine anxiety,sleep and pain medications without talking with your primary care practitioner ? ?Special Instructions: If you have smoked or chewed Tobacco  in the last 2 yrs please stop smoking, stop any regular Alcohol  and or any Recreational drug use. ? ?Wear Seat belts while driving.  Do not drive if taking any narcotic, mind altering or controlled substances or recreational drugs or alcohol.   ? ? ? ? ? ? ? ?

## 2021-05-22 NOTE — Discharge Summary (Signed)
Physician Discharge Summary  ?Amber Church BPZ:[id number] DOB: 1955-01-19 DOA: 05/16/2021 ? ?PCP: Denita Lung, MD ?Surgeon: Dr Okey Dupre ?Admit date: 05/16/2021 ?Discharge date: 05/22/2021 ? ?Admitted From:  Home  ?Disposition: Home  ? ?Recommendations for Outpatient Follow-up:  ?Follow up with PCP in 2 weeks ?Follow up with surgeon next week as recommended ? ?Discharge Condition: stable   ?CODE STATUS: full   ?DIET: regular ? ?Brief Hospitalization Summary: ?Please see all hospital notes, images, labs for full details of the hospitalization. ?66 y/o female with hypertension, hyperlipidemia, GERD, diverticulosis, osteoarthritis, anxiety and depression presented to the emergency department early this morning with symptoms of right upper quadrant abdominal pain associated with nausea and vomiting.  Symptoms started yesterday around noon.  Patient says symptoms have been intermittent lasting approximately 1 hour.  Patient reports that pain returned after eating later in the day and has been severe since that time.  Patient denies shortness of breath chest pain.  Patient denies fever chills diarrhea constipation.  Patient was noted to have elevated LFTs and leukocytosis and sent for right upper quadrant abdominal ultrasound with findings of cholelithiasis but no acute cholecystitis. She does have gallstone pancreatitis with markedly elevated lipase on admission. She was started on supportive measures to calm down the pancreatitis.   Surgery was consulted and recommended admission and possible operative management when medically ready. ? ?05/18/2021: Pt taken to OR today for lap chole ?05/19/2021: POD#1 s/p lap chole, feeling better, still having a lot of pain, requiring IV pain meds, full liquid diet today, advance as tolerated.  ?05/20/2021: Postop ileus developing.  Weaning pain meds. Lipase normalized. WBC bumped today.  Continue IV antibiotics.  ?05/21/2021: WBC bumped up to 21. Less pain med usage. Eating diet.  No BM.  ?05/22/2021: WBC coming down. Pt having multiple bowel movements and feeling better.  Surgeon evaluated and can discharge home today.  ? ?Hospital Course by Problem List ? ?Assessment and Plan: ?* Symptomatic cholelithiasis ?-- surgery consultation appreciated ?-- POD#4 s/p lap chole ?-- routine postop care ?-- tolerating soft diet.  Continue ambulating the halls.  ?-- DC  Home today per surgery with outpatient follow up next week with surgery ? ?Acute gallstone pancreatitis ?-- postop day #4 s/p lap chole  ?-- lipase normalized now, LFTs coming down ?-- surgery said can discharge home today and followup next week in office ? ?Colicky RUQ abdominal pain ?-- resolved now after lap chole ?-- General surgery following ?-- Pt is postop s/p lap chole 5/12  ?-- pain much better controlled now ?-- DC home follow up with surgery next week  ? ?Ileus, postoperative ([facility]) ?-- laxatives and suppositories as ordered by surgery ?-- Pt having multiple bowel movements now  ? ?Calculus of gallbladder with acute cholecystitis without obstruction ?-- postop s/p lap chole 05/18/21 ?-- DC home follow up with surgery next week  ? ?Hypomagnesemia ?-- IV replacement ordered and repleted  ? ?Polycythemia ?--  Resolved after IV fluids  ? ?Hyperbilirubinemia ?-- secondary to cholelithiasis, bowel rest ordered and surgery consultation requested ?-- trending down overall, slight postop bump  ?-- s/p lap chole on 5/12 ?-- resolved now ? ?Elevated LFTs ?-- trending down post lap chole ? ? ?Leukocytosis ?-- postop s/p lap chole on 5/12 ?-- WBC trending down  ? ?Spinal stenosis of lumbar region ?-- currently stable, follow  ? ?Obesity (BMI 30-39.9) ?-- stable, BMI 30  ? ?Hyperlipidemia LDL goal <100 ?-- stable, resumed home meds  ? ?Hypertension ?-- resumed home  oral meds ? ?Discharge Diagnoses:  ?Principal Problem: ?  Symptomatic cholelithiasis ?Active Problems: ?  Colicky RUQ abdominal pain ?  Acute gallstone pancreatitis ?  Hypertension ?   Hyperlipidemia LDL goal <100 ?  Obesity (BMI 30-39.9) ?  Spinal stenosis of lumbar region ?  Leukocytosis ?  Elevated LFTs ?  Hyperbilirubinemia ?  Polycythemia ?  Hypomagnesemia ?  Calculus of gallbladder with acute cholecystitis without obstruction ?  Ileus, postoperative ([place]) ? ? ?Discharge Instructions: ? ?Allergies as of 05/22/2021   ?No Known Allergies ?  ? ?  ?Medication List  ?  ? ?TAKE these medications   ? ?acetaminophen 650 MG CR tablet ?Commonly known as: TYLENOL ?Take 650 mg by mouth every 8 (eight) hours as needed for pain. ?  ?diclofenac Sodium 1 % Gel ?Commonly known as: Voltaren ?Apply 2 g topically 4 (four) times daily as needed (pain). ?  ?ibuprofen 800 MG tablet ?Commonly known as: ADVIL ?Take 1 tablet (800 mg total) by mouth every 8 (eight) hours as needed for mild pain. ?  ?lisinopril-hydrochlorothiazide 10-12.5 MG tablet ?Commonly known as: ZESTORETIC ?Take 1 tablet by mouth daily. ?  ?oxyCODONE 5 MG immediate release tablet ?Commonly known as: Roxicodone ?Take 1-2 tablets (5-10 mg total) by mouth every 8 (eight) hours as needed for up to 5 days for severe pain. ?  ?pravastatin 40 MG tablet ?Commonly known as: PRAVACHOL ?Take 1 tablet (40 mg total) by mouth daily. ?  ?senna-docusate 8.6-50 MG tablet ?Commonly known as: Senokot-S ?Take 2 tablets by mouth at bedtime. ?  ? ?  ? ? Follow-up Information   ? ? Pappayliou, Flint Melter, DO. Call.   ?Specialty: General Surgery ?Why: Call to schedule follow-up appointment next week ?Contact information: ?[address] ?[place] [address] ?[phone] ? ? ?  ?  ? ? Denita Lung, MD. Schedule an appointment as soon as possible for a visit in 2 week(s).   ?Specialty: Family Medicine ?Why: Hospital Follow Up ?Contact information: ?[address] ?[place] [address] ?[phone] ? ? ?  ?  ? ?  ?  ? ?  ? ?No Known Allergies ?Allergies as of 05/22/2021   ?No Known Allergies ?  ? ?  ?Medication List  ?  ? ?TAKE these medications    ? ?acetaminophen 650 MG CR tablet ?Commonly known as: TYLENOL ?Take 650 mg by mouth every 8 (eight) hours as needed for pain. ?  ?diclofenac Sodium 1 % Gel ?Commonly known as: Voltaren ?Apply 2 g topically 4 (four) times daily as needed (pain). ?  ?ibuprofen 800 MG tablet ?Commonly known as: ADVIL ?Take 1 tablet (800 mg total) by mouth every 8 (eight) hours as needed for mild pain. ?  ?lisinopril-hydrochlorothiazide 10-12.5 MG tablet ?Commonly known as: ZESTORETIC ?Take 1 tablet by mouth daily. ?  ?oxyCODONE 5 MG immediate release tablet ?Commonly known as: Roxicodone ?Take 1-2 tablets (5-10 mg total) by mouth every 8 (eight) hours as needed for up to 5 days for severe pain. ?  ?pravastatin 40 MG tablet ?Commonly known as: PRAVACHOL ?Take 1 tablet (40 mg total) by mouth daily. ?  ?senna-docusate 8.6-50 MG tablet ?Commonly known as: Senokot-S ?Take 2 tablets by mouth at bedtime. ?  ? ?  ? ? ?Procedures/Studies: ?US ABDOMEN LIMITED RUQ (LIVER/GB) ? ?Result Date: 05/16/2021 ?EXAM: ULTRASOUND ABDOMEN LIMITED RIGHT UPPER QUADRANT COMPARISON:  None Available. FINDINGS: Gallbladder: No wall thickening visualized. Multiple gallstone seen, largest measures 1.1 cm. No sonographic Murphy sign noted by sonographer. Common  bile duct: Diameter: 0.5 mm No intrahepatic biliary ductal dilation. Liver: No focal lesion identified. Within normal limits in parenchymal echogenicity. Portal vein is patent on color Doppler imaging with normal direction of blood flow towards the liver. Other: None. IMPRESSION: Cholelithiasis with no sonographic evidence of acute cholecystitis. Electronically Signed   By: Yetta Glassman M.D.   On: 05/16/2021 08:09   ?  ?Subjective: ?Pt reports she is feeling much better, and she has had multiple bowel movements  ? ?Discharge Exam: ?Vitals:  ? 05/21/21 2228 05/22/21 0443  ?BP: (!) 154/97 (!) 157/89  ?Pulse: 77 68  ?Resp: 19 18  ?Temp: 98 ?F (36.7 ?C) 98.2 ?F (36.8 ?C)  ?SpO2: 99% 96%  ? ?Vitals:  ? 05/21/21  0611 05/21/21 1408 05/21/21 2228 05/22/21 0443  ?BP: (!) 144/87 (!) 143/87 (!) 154/97 (!) 157/89  ?Pulse: 86 71 77 68  ?Resp:  '18 19 18  '$ ?Temp:  98.3 ?F (36.8 ?C) 98 ?F (36.7 ?C) 98.2 ?F (36.8 ?C)  ?TempSrc:

## 2021-05-22 NOTE — Care Management Important Message (Signed)
Important Message ? ?Patient Details  ?Name: Amber Church ?MRN: [id number] ?Date of Birth: [birth year] ? ? ?Medicare Important Message Given:  Yes ? ? ? ? ?Tommy Medal ?05/22/2021, 11:37 AM ?

## 2021-05-22 NOTE — Progress Notes (Signed)
[facility] Progress Note ? ?4 Days Post-Op  ?Subjective: ?Patient seen and examined.  She is resting comfortably in bed.  She feels significantly better this morning, as she had multiple bowel movements yesterday evening.  She was able to tolerate her diet today without nausea and vomiting.  Her pain is well controlled with oral pain medications.  She would like to go home today if possible. ? ?Objective: ?Vital signs in last 24 hours: ?Temp:  [98 ?F (36.7 ?C)-98.3 ?F (36.8 ?C)] 98.2 ?F (36.8 ?C) (05/16 0443) ?Pulse Rate:  [68-77] 68 (05/16 0443) ?Resp:  [18-19] 18 (05/16 0443) ?BP: (143-157)/(87-97) 157/89 (05/16 0443) ?SpO2:  [96 %-99 %] 96 % (05/16 0443) ?Last BM Date : 05/19/21 ? ?Intake/Output from previous day: ?05/15 0701 - 05/16 0700 ?In: 1200 [P.O.:1200] ?Out: -  ?Intake/Output this shift: ?No intake/output data recorded. ? ?General appearance: alert, cooperative, and no distress ?GI: Abdomen soft, mild distention, nontender to percussion and palpation; no rigidity, guarding, rebound tenderness; incisions C/D/I with Dermabond in place, mild ecchymosis around the incisions ? ?Lab Results:  ?Recent Labs  ?  05/21/21 ?9622 05/22/21 ?2979  ?WBC 21.1* 17.4*  ?HGB 10.6* 11.8*  ?HCT 33.4* 36.4  ?PLT 281 324  ? ?BMET ?Recent Labs  ?  05/20/21 ?8921 05/21/21 ?0446  ?NA 136 137  ?K 3.4* 3.6  ?CL 104 102  ?CO2 27 29  ?GLUCOSE 84 101*  ?BUN 7* 6*  ?CREATININE 0.52 0.59  ?CALCIUM 7.3* 7.4*  ? ?PT/INR ?No results for input(s): LABPROT, INR in the last 72 hours. ? ?Studies/Results: ?No results found. ? ?Anti-infectives: ?Anti-infectives (From admission, onward)  ? ? Start     Dose/Rate Route Frequency Ordered Stop  ? 05/21/21 1600  piperacillin-tazobactam (ZOSYN) IVPB 3.375 g  Status:  Discontinued       ? 3.375 g ?12.5 mL/hr over 240 Minutes Intravenous Every 8 hours 05/21/21 0829 05/21/21 1048  ? 05/21/21 0845  piperacillin-tazobactam (ZOSYN) IVPB 3.375 g       ? 3.375 g ?100 mL/hr over 30 Minutes  Intravenous  Once 05/21/21 0750 05/21/21 0930  ? 05/17/21 1230  metroNIDAZOLE (FLAGYL) IVPB 500 mg  Status:  Discontinued       ? 500 mg ?100 mL/hr over 60 Minutes Intravenous Every 12 hours 05/17/21 1135 05/21/21 0739  ? 05/17/21 0900  cefTRIAXone (ROCEPHIN) 2 g in sodium chloride 0.9 % 100 mL IVPB  Status:  Discontinued       ? 2 g ?200 mL/hr over 30 Minutes Intravenous Every 24 hours 05/16/21 0912 05/21/21 0739  ? 05/16/21 0845  cefTRIAXone (ROCEPHIN) 2 g in sodium chloride 0.9 % 100 mL IVPB       ? 2 g ?200 mL/hr over 30 Minutes Intravenous  Once 05/16/21 0838 05/16/21 1250  ? ?  ? ? ?Assessment/Plan: ? ?Patient is 66 year old female who was admitted with gallstone pancreatitis.  She is status post laparoscopic cholecystectomy on 5/12. ? ?-Leukocytosis improving today, 17.4 from 21.1 ?-Regular diet ?-Continue bowel regimen ?-PRN pain medication and antiemetics ?-Given that patient's leukocytosis is improving, and she has had improvement of her ileus, she is stable for discharge from a general surgery standpoint ?-Would discharge patient home with Roxicodone to be taken as needed.  Also recommend that the patient take scheduled Tylenol while at home. ?-I advised her to continue her bowel regimen unless she is having liquid stools ?-She will follow-up with me next week ? ? LOS: 5 days  ? ? ?Tonji Elliff A  Nhu Glasby ?05/22/2021 ? ?

## 2021-05-23 ENCOUNTER — Telehealth: Payer: Self-pay

## 2021-05-23 NOTE — Telephone Encounter (Signed)
Transition Care Management Unsuccessful Follow-up Telephone Call ? ?Date of discharge and from where:  05/22/2021 ?[facility] ?Attempts:  1st Attempt ? ?Reason for unsuccessful TCM follow-up call:  Left voice message ? ? ? ?

## 2021-05-24 ENCOUNTER — Telehealth: Payer: Self-pay

## 2021-05-24 ENCOUNTER — Ambulatory Visit: Payer: Medicare Other | Admitting: Orthopaedic Surgery

## 2021-05-24 ENCOUNTER — Encounter: Payer: Self-pay | Admitting: Orthopaedic Surgery

## 2021-05-24 ENCOUNTER — Telehealth: Payer: Self-pay | Admitting: *Deleted

## 2021-05-24 ENCOUNTER — Ambulatory Visit (INDEPENDENT_AMBULATORY_CARE_PROVIDER_SITE_OTHER): Payer: Medicare Other | Admitting: Orthopaedic Surgery

## 2021-05-24 VITALS — Ht 64.0 in | Wt 175.0 lb

## 2021-05-24 DIAGNOSIS — M48062 Spinal stenosis, lumbar region with neurogenic claudication: Secondary | ICD-10-CM

## 2021-05-24 MED ORDER — OXYCODONE HCL 5 MG PO TABS: 5.0000 mg | ORAL_TABLET | Freq: Four times a day (QID) | ORAL | 0 refills | Status: AC | PRN

## 2021-05-24 NOTE — Telephone Encounter (Signed)
Received call from patient [phone]~ telephone.   Surgical Date: 05/18/2021 Procedure: Lap Chole  Allergies: NKDA Pharmacy: [facility]  Patient reports that she was discharged from hospital with prescription for Oxycodone IR '5mg'$ , but prescription has not been able to be filled. States that pharmacy has been attempting to reach provider for further details. No call has been received at [facility].   Advised to use IBU '600mg'$  PO Q 4 hrs PRN for pain. Reviewed patient labs. Noted that patient lever function has been elevated x1 week, most likely due to cholecystitis. Advised to limit APAP due to liver function.   Noted that prescription was sent to [facility] in [place] on 05/22/2021 by hospitalist Irwin Brakeman, MD. Call placed to pharmacy to inquire. Was advised that [facility] is currently out of stock of Oxycodone IR in every dose. Pharmacist reports that Percocet is available in following doses: 5/325, 7.5/325, 10/325.  Call placed to [facility]. Was advised that Oxycodone IR '5mg'$  is in stock.   Please advise.

## 2021-05-24 NOTE — Progress Notes (Signed)
Office Visit Note   Patient: Amber Church           Date of Birth: 1955/08/31           MRN: [id number] Visit Date: 05/24/2021              Requested by: Denita Lung, MD [place],  [place] [address] PCP: Denita Lung, MD   Assessment & Plan: Visit Diagnoses:  1. Spinal stenosis of lumbar region with neurogenic claudication     Plan: Return in 6 weeks.  If she is feeling great she can come back sooner she will need reimaging studies MRI scan lumbar to evaluate progression at the L3-4 level to make decision about whether she needs to have just the L4-5 level decompressed or both L4-3 and also L3-4 level.  Follow-Up Instructions: Return in about 6 weeks (around 07/05/2021).   Orders:  No orders of the defined types were placed in this encounter.  No orders of the defined types were placed in this encounter.     Procedures: No procedures performed   Clinical Data: No additional findings.   Subjective: Chief Complaint  Patient presents with   Lower Back - Pain    HPI 66 year old female seen in follow-up for lumbar spinal stenosis with previous outlined plan for 1 level decompression sometime this summer at the L4-5 level.  She had mild stenosis at L3-4.  Recently been in the hospital with gallbladder problems and had gallbladder pancreatitis and spent 6 days in the hospital.  She is now out of the hospital and returns today to discuss treatment plan for the lumbar spine.  Patient had neurogenic claudication symptoms.  Pain in the lower back pain with standing.  Review of Systems all the systems noncontributory.  Recent lap chole as mentioned above.   Objective: Vital Signs: Ht '5\' 4"'$  (1.626 m)   Wt 175 lb (79.4 kg)   BMI 30.04 kg/m   Physical Exam Constitutional:      Appearance: She is well-developed.  HENT:     Head: Normocephalic.     Right Ear: External ear normal.     Left Ear: External ear normal. There is no impacted cerumen.   Eyes:     Pupils: Pupils are equal, round, and reactive to light.  Neck:     Thyroid: No thyromegaly.     Trachea: No tracheal deviation.  Cardiovascular:     Rate and Rhythm: Normal rate.  Pulmonary:     Effort: Pulmonary effort is normal.  Abdominal:     Palpations: Abdomen is soft.  Musculoskeletal:     Cervical back: No rigidity.  Skin:    General: Skin is warm and dry.  Neurological:     Mental Status: She is alert and oriented to person, place, and time.  Psychiatric:        Behavior: Behavior normal.    Ortho Exam patient has abdominal dressings from lap chole procedure.  Patient is ambulatory.  Specialty Comments:  No specialty comments available.  Imaging: No results found.   PMFS History: Patient Active Problem List   Diagnosis Date Noted   Ileus, postoperative ([facility]) 05/20/2021   Calculus of gallbladder with acute cholecystitis without obstruction    Hypomagnesemia 05/17/2021   Symptomatic cholelithiasis 05/16/2021   Leukocytosis 05/16/2021   Elevated LFTs 87/86/7672   Colicky RUQ abdominal pain 05/16/2021   Hyperbilirubinemia 05/16/2021   Polycythemia 05/16/2021   Acute gallstone pancreatitis 05/16/2021   Spinal stenosis  of lumbar region 04/04/2020   S/P TKR (total knee replacement), right 10/02/2018   Hypertension 08/06/2010   Hyperlipidemia LDL goal <100 08/06/2010   Obesity (BMI 30-39.9) 08/06/2010   Past Medical History:  Diagnosis Date   Abnormal EKG    Abnormal pap    s/p cryotherapy   Anxiety    Arthritis    Depression    Diverticulosis    GERD (gastroesophageal reflux disease)    Hyperlipidemia    Hypertension    S/P colonoscopic polypectomy     Family History  Problem Relation Age of Onset   Alzheimer's disease Mother    Hyperlipidemia Mother    Other Father        died of gunshot wound   Osteoporosis Sister    Osteoporosis Sister    ALS Maternal Aunt    ALS Maternal Uncle    Diabetes Paternal Aunt    Cancer Maternal  Grandfather        lung cancer   Cancer Paternal Grandmother        ?type   Heart disease Neg Hx     Past Surgical History:  Procedure Laterality Date   CHOLECYSTECTOMY N/A 05/18/2021   Procedure: LAPAROSCOPIC CHOLECYSTECTOMY;  Surgeon: Rusty Aus, DO;  Location: [facility];  Service: General;  Laterality: N/A;   COLONOSCOPY  03/18/11; 2000   Dr. Amedeo Plenty; diverticulosis in sigmoid colon   crysurgery  age 63   for abnormal pap   LAMINECTOMY  03/2002   TOTAL KNEE ARTHROPLASTY Right 10/02/2018   TOTAL KNEE ARTHROPLASTY Right 10/02/2018   Procedure: RIGHT TOTAL KNEE ARTHROPLASTY;  Surgeon: Newt Minion, MD;  Location: [facility];  Service: Orthopedics;  Laterality: Right;   Social History   Occupational History   Occupation: Fish farm manager: [employer]  Tobacco Use   Smoking status: Never   Smokeless tobacco: Never  Vaping Use   Vaping Use: Never used  Substance and Sexual Activity   Alcohol use: Yes    Comment: glass of wine 1-2 times per week.   Drug use: No   Sexual activity: Yes    Partners: Male    Comment: postmenopausal

## 2021-05-24 NOTE — Telephone Encounter (Signed)
Transition Care Management Follow-up Telephone Call Date of discharge and from where: 05/22/2021 [facility] How have you been since you were released from the hospital? A little better, but no real rest Any questions or concerns? No  Items Reviewed: Did the pt receive and understand the discharge instructions provided? Yes  Medications obtained and verified?  Pharmacy having trouble getting pain medicine Other? No  Any new allergies since your discharge? No  Dietary orders reviewed? Yes Do you have support at home? Yes   Home Care and Equipment/Supplies: Were home health services ordered? no If so, what is the name of the agency? N/a  Has the agency set up a time to come to the patient's home? not applicable Were any new equipment or medical supplies ordered?  No What is the name of the medical supply agency? N/a Were you able to get the supplies/equipment? not applicable Do you have any questions related to the use of the equipment or supplies? No  Functional Questionnaire: (I = Independent and D = Dependent) ADLs: I  Bathing/Dressing- I  Meal Prep- I  Eating- I  Maintaining continence- I  Transferring/Ambulation- I  Managing Meds- I  Follow up appointments reviewed:  PCP Hospital f/u appt confirmed? Yes  Scheduled to see Dr. Redmond School on 05/29/2021 @ 12:00. Are transportation arrangements needed? No  If their condition worsens, is the pt aware to call PCP or go to the Emergency Dept.? Yes Was the patient provided with contact information for the PCP's office or ED? Yes Was to pt encouraged to call back with questions or concerns? Yes

## 2021-05-29 ENCOUNTER — Encounter: Payer: Self-pay | Admitting: Family Medicine

## 2021-05-29 ENCOUNTER — Ambulatory Visit (INDEPENDENT_AMBULATORY_CARE_PROVIDER_SITE_OTHER): Payer: Medicare Other | Admitting: Family Medicine

## 2021-05-29 VITALS — BP 100/70 | HR 102 | Temp 97.5°F | Wt 169.8 lb

## 2021-05-29 DIAGNOSIS — Z9049 Acquired absence of other specified parts of digestive tract: Secondary | ICD-10-CM

## 2021-05-29 DIAGNOSIS — K851 Biliary acute pancreatitis without necrosis or infection: Secondary | ICD-10-CM

## 2021-05-29 NOTE — Progress Notes (Signed)
   Subjective:    Patient ID: Amber Church, female    DOB: 1955-01-21, 66 y.o.   MRN: [id number]  HPI She is here for recheck after recent hospitalization and treatment for cholelithiasis and gallstone pancreatitis.  She was admitted on May 10 and sent home on the 16th.  The hospital record including discharge summary was reviewed.  Initially she did have difficulty with elevated LFTs as well as lipase.  She did fairly well in the hospital however 1 day prior to discharge she did have a slight increase in her white blood count.  She has been home for about a week.  She does complains of some mid abdominal and mid back discomfort but no nausea, vomiting.  She continues use codeine as well as Tylenol for pain relief.  She is on MiraLAX.  Her eating habits have been minimal during the same timeframe.  Her last BM was about 3 days ago.  Review of Systems     Objective:   Physical Exam Alert and pale complaining of abdominal distress.  Abdominal exam shows surgical scars healing nicely.  Bowel sounds present.  She is uncomfortable with palpation of her abdomen but no rebound.       Assessment & Plan:  Gallstone pancreatitis - Plan: CBC with Differential/Platelet, Comprehensive metabolic panel, Amylase, Lipase  History of laparoscopic cholecystectomy - Plan: CBC with Differential/Platelet, Comprehensive metabolic panel, Amylase, Lipase She will continue on her pain medications.  Continued use of MiraLAX as her BM pattern could be lack of food.  I will call tomorrow after I get the blood work back.

## 2021-05-30 ENCOUNTER — Telehealth: Payer: Self-pay | Admitting: *Deleted

## 2021-05-30 ENCOUNTER — Emergency Department (HOSPITAL_COMMUNITY): Payer: Medicare Other

## 2021-05-30 ENCOUNTER — Inpatient Hospital Stay (HOSPITAL_COMMUNITY)
Admission: EM | Admit: 2021-05-30 | Discharge: 2021-06-03 | DRG: 438 | Disposition: A | Discharge status: Other Acute Inpatient Hospital | Payer: Medicare Other | Attending: Family Medicine | Admitting: Family Medicine

## 2021-05-30 ENCOUNTER — Other Ambulatory Visit: Payer: Self-pay

## 2021-05-30 ENCOUNTER — Encounter (HOSPITAL_COMMUNITY): Payer: Self-pay | Admitting: Emergency Medicine

## 2021-05-30 ENCOUNTER — Telehealth: Payer: Self-pay

## 2021-05-30 DIAGNOSIS — D649 Anemia, unspecified: Secondary | ICD-10-CM | POA: Diagnosis not present

## 2021-05-30 DIAGNOSIS — K851 Biliary acute pancreatitis without necrosis or infection: Secondary | ICD-10-CM | POA: Diagnosis not present

## 2021-05-30 DIAGNOSIS — I96 Gangrene, not elsewhere classified: Secondary | ICD-10-CM | POA: Diagnosis not present

## 2021-05-30 DIAGNOSIS — Z79899 Other long term (current) drug therapy: Secondary | ICD-10-CM

## 2021-05-30 DIAGNOSIS — Z96651 Presence of right artificial knee joint: Secondary | ICD-10-CM | POA: Diagnosis present

## 2021-05-30 DIAGNOSIS — M1990 Unspecified osteoarthritis, unspecified site: Secondary | ICD-10-CM | POA: Diagnosis present

## 2021-05-30 DIAGNOSIS — K449 Diaphragmatic hernia without obstruction or gangrene: Secondary | ICD-10-CM | POA: Diagnosis not present

## 2021-05-30 DIAGNOSIS — R140 Abdominal distension (gaseous): Secondary | ICD-10-CM | POA: Diagnosis not present

## 2021-05-30 DIAGNOSIS — E8809 Other disorders of plasma-protein metabolism, not elsewhere classified: Secondary | ICD-10-CM | POA: Diagnosis not present

## 2021-05-30 DIAGNOSIS — R1011 Right upper quadrant pain: Secondary | ICD-10-CM | POA: Diagnosis not present

## 2021-05-30 DIAGNOSIS — D72829 Elevated white blood cell count, unspecified: Secondary | ICD-10-CM | POA: Diagnosis present

## 2021-05-30 DIAGNOSIS — Z833 Family history of diabetes mellitus: Secondary | ICD-10-CM | POA: Diagnosis not present

## 2021-05-30 DIAGNOSIS — K222 Esophageal obstruction: Secondary | ICD-10-CM | POA: Diagnosis not present

## 2021-05-30 DIAGNOSIS — J984 Other disorders of lung: Secondary | ICD-10-CM | POA: Diagnosis not present

## 2021-05-30 DIAGNOSIS — D75839 Thrombocytosis, unspecified: Secondary | ICD-10-CM | POA: Diagnosis present

## 2021-05-30 DIAGNOSIS — E43 Unspecified severe protein-calorie malnutrition: Secondary | ICD-10-CM | POA: Diagnosis not present

## 2021-05-30 DIAGNOSIS — R109 Unspecified abdominal pain: Secondary | ICD-10-CM | POA: Diagnosis not present

## 2021-05-30 DIAGNOSIS — Z8489 Family history of other specified conditions: Secondary | ICD-10-CM | POA: Diagnosis not present

## 2021-05-30 DIAGNOSIS — Z801 Family history of malignant neoplasm of trachea, bronchus and lung: Secondary | ICD-10-CM

## 2021-05-30 DIAGNOSIS — I10 Essential (primary) hypertension: Secondary | ICD-10-CM | POA: Diagnosis not present

## 2021-05-30 DIAGNOSIS — K863 Pseudocyst of pancreas: Secondary | ICD-10-CM | POA: Diagnosis not present

## 2021-05-30 DIAGNOSIS — J90 Pleural effusion, not elsewhere classified: Secondary | ICD-10-CM | POA: Diagnosis not present

## 2021-05-30 DIAGNOSIS — K8500 Idiopathic acute pancreatitis without necrosis or infection: Secondary | ICD-10-CM | POA: Diagnosis not present

## 2021-05-30 DIAGNOSIS — R1013 Epigastric pain: Secondary | ICD-10-CM

## 2021-05-30 DIAGNOSIS — G8929 Other chronic pain: Secondary | ICD-10-CM | POA: Diagnosis not present

## 2021-05-30 DIAGNOSIS — M47816 Spondylosis without myelopathy or radiculopathy, lumbar region: Secondary | ICD-10-CM | POA: Diagnosis not present

## 2021-05-30 DIAGNOSIS — E785 Hyperlipidemia, unspecified: Secondary | ICD-10-CM | POA: Diagnosis not present

## 2021-05-30 DIAGNOSIS — K219 Gastro-esophageal reflux disease without esophagitis: Secondary | ICD-10-CM | POA: Diagnosis present

## 2021-05-30 DIAGNOSIS — K859 Acute pancreatitis without necrosis or infection, unspecified: Secondary | ICD-10-CM | POA: Diagnosis present

## 2021-05-30 LAB — COMPREHENSIVE METABOLIC PANEL
ALT: 15 IU/L (ref 0–32)
ALT: 15 U/L (ref 0–44)
AST: 21 IU/L (ref 0–40)
AST: 20 U/L (ref 15–41)
Albumin/Globulin Ratio: 1.2 (ref 1.2–2.2)
Albumin: 3.7 g/dL — ABNORMAL LOW (ref 3.8–4.8)
Albumin: 3.3 g/dL — ABNORMAL LOW (ref 3.5–5.0)
Alkaline Phosphatase: 133 IU/L — ABNORMAL HIGH (ref 44–121)
Alkaline Phosphatase: 115 U/L (ref 38–126)
Anion gap: 8 (ref 5–15)
BUN/Creatinine Ratio: 10 — ABNORMAL LOW (ref 12–28)
BUN: 8 mg/dL (ref 8–27)
BUN: 10 mg/dL (ref 8–23)
Bilirubin Total: 0.4 mg/dL (ref 0.0–1.2)
CO2: 24 mmol/L (ref 20–29)
CO2: 27 mmol/L (ref 22–32)
Calcium: 8.7 mg/dL (ref 8.7–10.3)
Calcium: 8.8 mg/dL — ABNORMAL LOW (ref 8.9–10.3)
Chloride: 98 mmol/L (ref 96–106)
Chloride: 101 mmol/L (ref 98–111)
Creatinine, Ser: 0.77 mg/dL (ref 0.57–1.00)
Creatinine, Ser: 0.75 mg/dL (ref 0.44–1.00)
GFR, Estimated: >60 mL/min (ref >60)
Globulin, Total: 3 g/dL (ref 1.5–4.5)
Glucose, Bld: 106 mg/dL — ABNORMAL HIGH (ref 70–99)
Glucose: 145 mg/dL — ABNORMAL HIGH (ref 70–99)
Potassium: 4 mmol/L (ref 3.5–5.2)
Potassium: 3.4 mmol/L — ABNORMAL LOW (ref 3.5–5.1)
Sodium: 138 mmol/L (ref 134–144)
Sodium: 136 mmol/L (ref 135–145)
Total Bilirubin: 0.5 mg/dL (ref 0.3–1.2)
Total Protein: 6.7 g/dL (ref 6.0–8.5)
Total Protein: 7.4 g/dL (ref 6.5–8.1)
eGFR: 86 mL/min/{1.73_m2} (ref >59)

## 2021-05-30 LAB — CBC WITH DIFFERENTIAL/PLATELET
Basophils Absolute: 0 10*3/uL (ref 0.0–0.2)
Basos: 0 %
EOS (ABSOLUTE): 0.2 10*3/uL (ref 0.0–0.4)
Eos: 1 %
Hematocrit: 36.1 % (ref 34.0–46.6)
Hemoglobin: 12.1 g/dL (ref 11.1–15.9)
Immature Grans (Abs): 0.1 10*3/uL (ref 0.0–0.1)
Immature Granulocytes: 1 %
Lymphocytes Absolute: 3 10*3/uL (ref 0.7–3.1)
Lymphs: 20 %
MCH: 29.4 pg (ref 26.6–33.0)
MCHC: 33.5 g/dL (ref 31.5–35.7)
MCV: 88 fL (ref 79–97)
Monocytes Absolute: 0.8 10*3/uL (ref 0.1–0.9)
Monocytes: 5 %
Neutrophils Absolute: 11 10*3/uL — ABNORMAL HIGH (ref 1.4–7.0)
Neutrophils: 73 %
Platelets: 592 10*3/uL — ABNORMAL HIGH (ref 150–450)
RBC: 4.12 x10E6/uL (ref 3.77–5.28)
RDW: 13.5 % (ref 11.7–15.4)
WBC: 15.2 10*3/uL — ABNORMAL HIGH (ref 3.4–10.8)

## 2021-05-30 LAB — CBC
HCT: 36 % (ref 36.0–46.0)
Hemoglobin: 11.7 g/dL — ABNORMAL LOW (ref 12.0–15.0)
MCH: 29.4 pg (ref 26.0–34.0)
MCHC: 32.5 g/dL (ref 30.0–36.0)
MCV: 90.5 fL (ref 80.0–100.0)
Platelets: 577 10*3/uL — ABNORMAL HIGH (ref 150–400)
RBC: 3.98 MIL/uL (ref 3.87–5.11)
RDW: 14.1 % (ref 11.5–15.5)
WBC: 16.3 10*3/uL — ABNORMAL HIGH (ref 4.0–10.5)
nRBC: 0 % (ref 0.0–0.2)

## 2021-05-30 LAB — URINALYSIS, ROUTINE W REFLEX MICROSCOPIC
Bacteria, UA: NONE SEEN
Bilirubin Urine: NEGATIVE
Glucose, UA: NEGATIVE mg/dL
Ketones, ur: NEGATIVE mg/dL
Leukocytes,Ua: NEGATIVE
Nitrite: NEGATIVE
Protein, ur: NEGATIVE mg/dL
Specific Gravity, Urine: 1.018 (ref 1.005–1.030)
pH: 6 (ref 5.0–8.0)

## 2021-05-30 LAB — LIPASE: Lipase: 97 U/L — ABNORMAL HIGH (ref 14–72)

## 2021-05-30 LAB — LIPASE, BLOOD: Lipase: 104 U/L — ABNORMAL HIGH (ref 11–51)

## 2021-05-30 LAB — AMYLASE: Amylase: 216 U/L — ABNORMAL HIGH (ref 31–110)

## 2021-05-30 MED ORDER — FENTANYL CITRATE PF 50 MCG/ML IJ SOSY
50.0000 ug | PREFILLED_SYRINGE | Freq: Once | INTRAMUSCULAR | Status: AC
  Administered 2021-05-31: 50 ug via INTRAVENOUS
  Filled 2021-05-30: qty 1

## 2021-05-30 MED ORDER — ONDANSETRON HCL 4 MG/2ML IJ SOLN
4.0000 mg | Freq: Once | INTRAMUSCULAR | Status: AC
  Administered 2021-05-30: 4 mg via INTRAVENOUS
  Filled 2021-05-30: qty 2

## 2021-05-30 MED ORDER — IOHEXOL 300 MG/ML  SOLN
100.0000 mL | Freq: Once | INTRAMUSCULAR | Status: AC | PRN
  Administered 2021-05-30: 100 mL via INTRAVENOUS

## 2021-05-30 MED ORDER — MORPHINE SULFATE (PF) 4 MG/ML IV SOLN
4.0000 mg | Freq: Once | INTRAVENOUS | Status: AC
  Administered 2021-05-30: 4 mg via INTRAVENOUS
  Filled 2021-05-30: qty 1

## 2021-05-30 MED ORDER — SODIUM CHLORIDE 0.9 % IV BOLUS
500.0000 mL | Freq: Once | INTRAVENOUS | Status: AC
  Administered 2021-05-31: 500 mL via INTRAVENOUS

## 2021-05-30 NOTE — ED Triage Notes (Signed)
Pt having mid-abdomen pain since having gallbladder removed 2 weeks ago.

## 2021-05-30 NOTE — ED Provider Notes (Signed)
Patient signed out to me by Evalee Jefferson, PA-C pending consultation with [facility].  See previous providers note for complete history and physical  Patient is apparently 2 weeks out lap chole cystectomy began having left upper quadrant and periumbilical pain.  Has appointment tomorrow morning with her general surgeon, but came to emergency department this evening due to unbearable pain.  A CT abdomen and pelvis was ordered that shows large pancreatic pseudocyst that is causing compression on her stomach.  Local GI was consulted, patient will need endoscopic drainage of the cyst, unfortunately there are no GI providers in [place] that performed this service.  GI in [place] was also consulted and provider there is unavailable at this time.  I have contacted [facility] is currently without medical beds and not taking patients for waitlist.  Will try consulting with [facility].  Spoke with coordinator at [facility], will have GI call back, but informed there is limited bed status.  Call back from [facility] still pending.  Discussed with overnight provider, Dr. Roxanne Mins who agrees to speak with [facility]    Kem Parkinson, PA-C 05/31/21 0006    Fredia Sorrow, MD 06/15/21 [phone]

## 2021-05-30 NOTE — Telephone Encounter (Signed)
Discussed case with Dr. Okey Dupre who recommended ER evaluation for possible pancreatitis.   Call placed to patient and patient made aware. Verbalized understanding.   Routed to PCP for review.

## 2021-05-30 NOTE — ED Provider Notes (Incomplete)
Patient signed out to me by Evalee Jefferson, PA-C pending consultation with [facility].  See previous providers note for complete history and physical  Patient is apparently 2 weeks out lap chole cystectomy began having left upper quadrant and periumbilical pain.  Has appointment tomorrow morning with her general surgeon, but came to emergency department this evening due to unbearable pain.  A CT abdomen and pelvis was ordered that shows large pancreatic pseudocyst that is causing compression on her stomach.  Local GI was consulted, patient will need endoscopic drainage of the cyst, unfortunately there are no GI providers in [place] that performed this service.  GI in [place] was also consulted and provider there is unavailable at this time.  I have contacted [facility] is currently without medical beds and not taking patients for waitlist.  Will try consulting with [facility].  Spoke with coordinator at [facility], will have GI call back, but informed there is limited bed status.

## 2021-05-30 NOTE — Telephone Encounter (Signed)
Received call from patient [phone]~ telephone.   Surgical Date: 05/18/2021 Procedure: Lap Chole  Patient reports severe epigastric and mid- back pain. Reports that pain is located below breast in center of upper abdomen and shoots through to her back. Reports pain is sharp in nature and is rated 8/10. States that she gets little to no relief with use of Oxycodone '5mg'$ . Encouraged patient to continue medication as prescribed. Advised to also use APAP/ IBU to assist with pain management.   Patient reports that the epigastric pain is worse at night. Reports that pain interferes with sleep. States that she is tolerating diet, but is not eating much. Encouraged fluid intake and bland diet. Advised that diet can be progressed as tolerated.   Of note, patient did see PCP on 05/29/2021 for abdominal/ back pain and had labs drawn. Noted Lipase elevated at 97, Amylase elevated at  216 and WBC elevated at 15.2.  Patient has follow up appointment with Dr. Okey Dupre on 05/31/2021. States that she cannot go another night without sleeping as she almost went to ED last night due to pain.   Please advise.

## 2021-05-30 NOTE — ED Provider Notes (Signed)
[facility] EMERGENCY DEPARTMENT Provider Note   CSN: [id number] Arrival date & time: 05/30/21  1245     History  Chief Complaint  Patient presents with   Abdominal Pain    Amber Church is a 66 y.o. female who underwent a laparoscopic cholecystectomy on May 12 at this hospital presenting for evaluation of worsening abdominal pain and distention since being discharged home.  She has been taking her hydrocodone prescribed every 6 hours, her last dose was taken early this morning but she states this does not adequately control her pain.  She said that last night she had pretty severe intense pain throughout the entirety of the night.  She has been passing flatus, has had small firm stools, has been using MiraLAX and Senokot to help with any narcotic induced constipation.  She has had no vomiting but has had some mild nausea and reports reduced appetite, her last meal was yesterday evening.  She has had no fevers or chills, denies chest pain or shortness of breath.  She is actually scheduled to see Dr. Noe Gens in her office tomorrow morning but with the escalating pain she was concerned to wait until then.  The history is provided by the patient.      Home Medications Prior to Admission medications   Medication Sig Start Date End Date Taking? Authorizing Provider  acetaminophen (TYLENOL) 650 MG CR tablet Take 650 mg by mouth every 8 (eight) hours as needed for pain.   Yes [provider]  diclofenac Sodium (VOLTAREN) 1 % GEL Apply 2 g topically 4 (four) times daily as needed (pain). 05/22/21  Yes Johnson, Clanford L, MD  ibuprofen (ADVIL) 800 MG tablet Take 1 tablet (800 mg total) by mouth every 8 (eight) hours as needed for mild pain. 05/22/21  Yes Johnson, Clanford L, MD  lisinopril-hydrochlorothiazide (ZESTORETIC) 10-12.5 MG tablet Take 1 tablet by mouth daily. 11/22/20  Yes Denita Lung, MD  oxyCODONE (ROXICODONE) 5 MG immediate release tablet Take 1 tablet (5 mg total)  by mouth every 6 (six) hours as needed for up to 7 days for severe pain. 05/24/21 05/31/21 Yes Pappayliou, Barnetta Chapel A, DO  pravastatin (PRAVACHOL) 40 MG tablet Take 1 tablet (40 mg total) by mouth daily. 11/22/20  Yes Denita Lung, MD  senna-docusate (SENOKOT-S) 8.6-50 MG tablet Take 2 tablets by mouth at bedtime. 05/22/21  Yes Murlean Iba, MD      Allergies    Patient has no known allergies.    Review of Systems   Review of Systems  Constitutional:  Negative for chills and fever.  HENT:  Negative for congestion and sore throat.   Eyes: Negative.   Respiratory:  Negative for cough, chest tightness and shortness of breath.   Cardiovascular:  Negative for chest pain.  Gastrointestinal:  Positive for abdominal distention, abdominal pain and nausea. Negative for vomiting.  Genitourinary: Negative.  Negative for decreased urine volume and dysuria.  Musculoskeletal:  Negative for arthralgias, joint swelling and neck pain.  Skin: Negative.  Negative for rash and wound.  Neurological:  Negative for dizziness, weakness, light-headedness, numbness and headaches.  Psychiatric/Behavioral: Negative.    All other systems reviewed and are negative.  Physical Exam Updated Vital Signs BP 121/78   Pulse 88   Temp 97.9 F (36.6 C) (Oral)   Resp 18   Ht '5\' 4"'$  (1.626 m)   Wt 77 kg   SpO2 96%   BMI 29.14 kg/m  Physical Exam Vitals and nursing note  reviewed.  Constitutional:      Appearance: She is well-developed.  HENT:     Head: Normocephalic and atraumatic.  Eyes:     Conjunctiva/sclera: Conjunctivae normal.  Cardiovascular:     Rate and Rhythm: Normal rate and regular rhythm.     Heart sounds: Normal heart sounds.  Pulmonary:     Effort: Pulmonary effort is normal.     Breath sounds: Normal breath sounds. No wheezing.  Abdominal:     General: Bowel sounds are increased. There is distension.     Palpations: Abdomen is soft.     Tenderness: There is generalized abdominal  tenderness.  Musculoskeletal:        General: Normal range of motion.     Cervical back: Normal range of motion.  Skin:    General: Skin is warm and dry.  Neurological:     General: No focal deficit present.     Mental Status: She is alert.    ED Results / Procedures / Treatments   Labs (all labs ordered are listed, but only abnormal results are displayed) Labs Reviewed  LIPASE, BLOOD - Abnormal; Notable for the following components:      Result Value   Lipase 104 (*)    All other components within normal limits  COMPREHENSIVE METABOLIC PANEL - Abnormal; Notable for the following components:   Potassium 3.4 (*)    Glucose, Bld 106 (*)    Calcium 8.8 (*)    Albumin 3.3 (*)    All other components within normal limits  CBC - Abnormal; Notable for the following components:   WBC 16.3 (*)    Hemoglobin 11.7 (*)    Platelets 577 (*)    All other components within normal limits  URINALYSIS, ROUTINE W REFLEX MICROSCOPIC - Abnormal; Notable for the following components:   Hgb urine dipstick SMALL (*)    All other components within normal limits    EKG None  Radiology CT ABDOMEN PELVIS W CONTRAST  Result Date: 05/30/2021 CLINICAL DATA:  Abdominal pain. Status post cholecystectomy 2 weeks ago. EXAM: CT ABDOMEN AND PELVIS WITH CONTRAST TECHNIQUE: Multidetector CT imaging of the abdomen and pelvis was performed using the standard protocol following bolus administration of intravenous contrast. RADIATION DOSE REDUCTION: This exam was performed according to the departmental dose-optimization program which includes automated exposure control, adjustment of the mA and/or kV according to patient size and/or use of iterative reconstruction technique. CONTRAST:  180m OMNIPAQUE IOHEXOL 300 MG/ML  SOLN COMPARISON:  None Available. FINDINGS: Lower chest: Scar versus subsegmental atelectasis noted within both lung bases. No pleural effusion. Hepatobiliary: No suspicious liver abnormality. Signs of  recent cholecystectomy. Trace fluid noted within the gallbladder fossa. No intrahepatic or common bile duct dilatation. Pancreas: Signs of subacute pancreatitis identified with large, mature bilobed pseudocyst originating from the head through tail of pancreas. The pseudocyst measures 16.1 x 9.1 by 10.7 cm and extends into the lesser sac with marked mass effect upon the posterior aspect of the stomach. Additional smaller satellite pseudocysts are noted at a slightly lower level within the ventral abdomen as noted on image 44/2. Spleen: Normal in size without focal abnormality. Adrenals/Urinary Tract: Normal adrenal glands. No nephrolithiasis, hydronephrosis or mass. Urinary bladder is unremarkable. Stomach/Bowel: The gastric lumen is compressed by large pseudo cyst. The stomach is pressed against the ventral abdominal wall. No significant small or large bowel wall thickening, inflammation, or distension. Sigmoid diverticulosis without signs of acute diverticulitis. Vascular/Lymphatic: Normal appearance of the abdominal aorta. Prominent  retroperitoneal lymph nodes likely reflect reactive adenopathy. No signs of abdominopelvic adenopathy. The intrahepatic portal vein is patent. Pseudo cyst exhibits marked mass-effect upon the extrahepatic portal vein and portal venous confluence without signs of occlusion. The splenic vein is not visualized and is presumed to be a chronically occluded with increased upper abdominal collateral vessel formation. Reproductive: Small partially exophytic calcified fibroid noted arising off the uterine fundus. No signs of adnexal mass. Other: Small volume of free fluid noted within the posterior pelvis. Musculoskeletal: No acute or suspicious osseous findings. Degenerative disc disease noted within the lower thoracic and lower sub lumbar spine. IMPRESSION: 1. Signs of subacute pancreatitis with large, mature bilobed pseudocyst with marked mass effect upon the posterior aspect of the  stomach, extrahepatic portal vein and portal venous confluence. 2. Signs of splenic vein occlusion identified with increased upper abdominal collateral formation. 3. No signs of bile duct obstruction. 4. Small volume of free fluid within the posterior pelvis. 5. Sigmoid diverticulosis without signs of acute diverticulitis. Electronically Signed   By: Kerby Moors M.D.   On: 05/30/2021 18:47   DG ABD ACUTE 2+V W 1V CHEST  Result Date: 05/30/2021 CLINICAL DATA:  Abdominal distention, status post cholecystectomy EXAM: DG ABDOMEN ACUTE WITH 1 VIEW CHEST COMPARISON:  None Available. FINDINGS: Transverse diameter of heart is slightly increased. There are no signs of pulmonary edema. Small left pleural effusion is seen. There are small linear densities in the lateral aspect of left lower lung fields. There is no focal pulmonary consolidation. There is no pneumothorax. Bowel gas pattern is nonspecific. There is no dilation of small-bowel loops. Gas and stool are present in colon. Surgical clips are seen in gallbladder fossa. No abnormal masses or calcifications are seen. Degenerative changes are noted in the lumbar spine. IMPRESSION: Bowel gas pattern is nonspecific. Small left pleural effusion. There are no signs of pulmonary edema or focal pulmonary consolidation. Electronically Signed   By: Elmer Picker M.D.   On: 05/30/2021 15:45    Procedures Procedures    Medications Ordered in ED Medications  morphine (PF) 4 MG/ML injection 4 mg (4 mg Intravenous Given 05/30/21 1542)  ondansetron (ZOFRAN) injection 4 mg (4 mg Intravenous Given 05/30/21 1542)  iohexol (OMNIPAQUE) 300 MG/ML solution 100 mL (100 mLs Intravenous Contrast Given 05/30/21 1820)    ED Course/ Medical Decision Making/ A&P                           Medical Decision Making Patient who is 2 weeks out from a lap cholecystectomy with persistent pain, periumbilical to left upper abdomen.  Afebrile, no nausea or vomiting but with decreased  oral intake.  Her labs today are fairly stable for her, she does continue to have an elevated WBC count at 16.3, this was in the range of her WBCs during her recent hospitalization.  Additionally she still has an elevated lipase at 104 also stable since recent hospitalization.  CT imaging is significant for a large pancreatic pseudocyst causing compression on her stomach in addition to smaller pseudocyst along the inferior pancreatic border.  Amount and/or Complexity of Data Reviewed Labs: ordered.    Details: Per above Radiology: ordered.    Details: Per above Discussion of management or test interpretation with external provider(s): Discussed CT findings with Dr. Laural Golden who states that patient is going to need endoscopic drainage of this large pseudocyst.  There are no GI providers here in [place] who can provide this  service.  He recommends patient being admitted at [facility] for this procedure.   Discussed patient with Kem Parkinson, PA-C who will call consults for admission.  Risk Prescription drug management.           Final Clinical Impression(s) / ED Diagnoses Final diagnoses:  Pancreatic pseudocyst    Rx / DC Orders ED Discharge Orders     None         Landis Martins 05/30/21 2009    Carmin Muskrat, MD 05/31/21 2254

## 2021-05-30 NOTE — Telephone Encounter (Signed)
Called pt back to advised that Dr. Redmond School stated she needs to go back to the ER labs show she is still having pancreatitis. Pt was also advised that it may be safer if she could find someone to drive her due to her pain level.   Elyse Jarvis RMA

## 2021-05-31 ENCOUNTER — Encounter: Payer: Medicare Other | Admitting: Surgery

## 2021-05-31 ENCOUNTER — Encounter (HOSPITAL_COMMUNITY): Payer: Self-pay | Admitting: Family Medicine

## 2021-05-31 ENCOUNTER — Telehealth: Payer: Self-pay | Admitting: Family Medicine

## 2021-05-31 DIAGNOSIS — D75839 Thrombocytosis, unspecified: Secondary | ICD-10-CM | POA: Diagnosis present

## 2021-05-31 DIAGNOSIS — K8500 Idiopathic acute pancreatitis without necrosis or infection: Secondary | ICD-10-CM

## 2021-05-31 DIAGNOSIS — Z801 Family history of malignant neoplasm of trachea, bronchus and lung: Secondary | ICD-10-CM | POA: Diagnosis not present

## 2021-05-31 DIAGNOSIS — K863 Pseudocyst of pancreas: Principal | ICD-10-CM | POA: Diagnosis present

## 2021-05-31 DIAGNOSIS — E8809 Other disorders of plasma-protein metabolism, not elsewhere classified: Secondary | ICD-10-CM | POA: Diagnosis present

## 2021-05-31 DIAGNOSIS — K859 Acute pancreatitis without necrosis or infection, unspecified: Secondary | ICD-10-CM | POA: Diagnosis present

## 2021-05-31 DIAGNOSIS — R1013 Epigastric pain: Secondary | ICD-10-CM | POA: Insufficient documentation

## 2021-05-31 DIAGNOSIS — R1011 Right upper quadrant pain: Secondary | ICD-10-CM | POA: Diagnosis present

## 2021-05-31 DIAGNOSIS — D649 Anemia, unspecified: Secondary | ICD-10-CM | POA: Diagnosis not present

## 2021-05-31 DIAGNOSIS — Z79899 Other long term (current) drug therapy: Secondary | ICD-10-CM | POA: Diagnosis not present

## 2021-05-31 DIAGNOSIS — M1990 Unspecified osteoarthritis, unspecified site: Secondary | ICD-10-CM | POA: Diagnosis present

## 2021-05-31 DIAGNOSIS — I10 Essential (primary) hypertension: Secondary | ICD-10-CM

## 2021-05-31 DIAGNOSIS — E785 Hyperlipidemia, unspecified: Secondary | ICD-10-CM | POA: Diagnosis present

## 2021-05-31 DIAGNOSIS — Z833 Family history of diabetes mellitus: Secondary | ICD-10-CM | POA: Diagnosis not present

## 2021-05-31 DIAGNOSIS — D72829 Elevated white blood cell count, unspecified: Secondary | ICD-10-CM | POA: Diagnosis not present

## 2021-05-31 DIAGNOSIS — K851 Biliary acute pancreatitis without necrosis or infection: Secondary | ICD-10-CM | POA: Diagnosis present

## 2021-05-31 DIAGNOSIS — Z8489 Family history of other specified conditions: Secondary | ICD-10-CM | POA: Diagnosis not present

## 2021-05-31 DIAGNOSIS — K219 Gastro-esophageal reflux disease without esophagitis: Secondary | ICD-10-CM | POA: Diagnosis present

## 2021-05-31 DIAGNOSIS — Z96651 Presence of right artificial knee joint: Secondary | ICD-10-CM | POA: Diagnosis present

## 2021-05-31 LAB — LACTIC ACID, PLASMA
Lactic Acid, Venous: 0.9 mmol/L (ref 0.5–1.9)
Lactic Acid, Venous: 0.9 mmol/L (ref 0.5–1.9)

## 2021-05-31 MED ORDER — LISINOPRIL-HYDROCHLOROTHIAZIDE 10-12.5 MG PO TABS: 1.0000 | ORAL_TABLET | Freq: Every day | ORAL | Status: DC

## 2021-05-31 MED ORDER — LISINOPRIL 10 MG PO TABS
10.0000 mg | ORAL_TABLET | Freq: Every day | ORAL | Status: DC
  Administered 2021-05-31 – 2021-06-02 (×3): 10 mg via ORAL
  Filled 2021-05-31 (×4): qty 1

## 2021-05-31 MED ORDER — ACETAMINOPHEN ER 650 MG PO TBCR: 650.0000 mg | EXTENDED_RELEASE_TABLET | Freq: Three times a day (TID) | ORAL | Status: DC | PRN

## 2021-05-31 MED ORDER — KETOROLAC TROMETHAMINE 30 MG/ML IJ SOLN: 30.0000 mg | Freq: Four times a day (QID) | INTRAMUSCULAR | Status: DC | PRN

## 2021-05-31 MED ORDER — LACTATED RINGERS IV SOLN
INTRAVENOUS | Status: DC
  Administered 2021-05-31: 1000 mL via INTRAVENOUS

## 2021-05-31 MED ORDER — MORPHINE SULFATE (PF) 4 MG/ML IV SOLN
3.0000 mg | INTRAVENOUS | Status: DC | PRN
  Administered 2021-05-31 – 2021-06-03 (×17): 3 mg via INTRAVENOUS
  Filled 2021-05-31 (×17): qty 1

## 2021-05-31 MED ORDER — PRAVASTATIN SODIUM 40 MG PO TABS
40.0000 mg | ORAL_TABLET | Freq: Every day | ORAL | Status: DC
Start: 2021-05-31 — End: 2021-06-01
  Administered 2021-05-31 – 2021-06-01 (×2): 40 mg via ORAL
  Filled 2021-05-31 (×2): qty 1

## 2021-05-31 MED ORDER — MORPHINE SULFATE (PF) 4 MG/ML IV SOLN
4.0000 mg | INTRAVENOUS | Status: DC | PRN
  Administered 2021-05-31 (×2): 4 mg via INTRAVENOUS
  Filled 2021-05-31 (×2): qty 1

## 2021-05-31 MED ORDER — HYDROCHLOROTHIAZIDE 12.5 MG PO TABS
12.5000 mg | ORAL_TABLET | Freq: Every day | ORAL | Status: DC
Start: 2021-05-31 — End: 2021-06-03
  Administered 2021-05-31 – 2021-06-02 (×3): 12.5 mg via ORAL
  Filled 2021-05-31 (×5): qty 1

## 2021-05-31 MED ORDER — SENNOSIDES-DOCUSATE SODIUM 8.6-50 MG PO TABS
2.0000 | ORAL_TABLET | Freq: Every day | ORAL | Status: DC
  Administered 2021-05-31 – 2021-06-01 (×2): 2 via ORAL
  Filled 2021-05-31 (×3): qty 2

## 2021-05-31 MED ORDER — PROCHLORPERAZINE EDISYLATE 10 MG/2ML IJ SOLN: 10.0000 mg | INTRAMUSCULAR | Status: DC | PRN

## 2021-05-31 MED ORDER — KETOROLAC TROMETHAMINE 30 MG/ML IJ SOLN
30.0000 mg | Freq: Four times a day (QID) | INTRAMUSCULAR | Status: DC | PRN
  Administered 2021-06-02 – 2021-06-03 (×2): 30 mg via INTRAVENOUS
  Filled 2021-05-31 (×2): qty 1

## 2021-05-31 MED ORDER — IBUPROFEN 800 MG PO TABS
800.0000 mg | ORAL_TABLET | Freq: Three times a day (TID) | ORAL | Status: DC | PRN
  Administered 2021-05-31: 800 mg via ORAL
  Filled 2021-05-31: qty 1

## 2021-05-31 MED ORDER — ONDANSETRON HCL 4 MG/2ML IJ SOLN: 4.0000 mg | Freq: Four times a day (QID) | INTRAMUSCULAR | Status: DC | PRN

## 2021-05-31 MED ORDER — POTASSIUM CHLORIDE 10 MEQ/100ML IV SOLN
10.0000 meq | INTRAVENOUS | Status: AC
  Administered 2021-05-31 (×3): 10 meq via INTRAVENOUS
  Filled 2021-05-31 (×3): qty 100

## 2021-05-31 NOTE — ED Notes (Addendum)
Breakfast meal tray given to pt. Nurse notified

## 2021-05-31 NOTE — Telephone Encounter (Signed)
Pt called in wants you to know she is admitted at [facility]. They found a cyst that needs to be drained but cant find a surgeon to drain it. Pt was wondering if you had any suggestions on a surgeon for her. They also mentioned there was a Psychologist, sport and exercise at [facility] but no beds available for her. Informed pt Dr Redmond School was out of the office

## 2021-05-31 NOTE — Assessment & Plan Note (Signed)
--   secondary to acute pancreatitis with pseudocyst -- pt waiting transfer to [facility] for endoscopic lysis of cyst -- continue pain management as ordered and supportive measures while waiting for bed at [facility]

## 2021-05-31 NOTE — ED Provider Notes (Signed)
Care assumed from [facility] [place], patient with pancreatic pseudocyst requiring transfer has no one at this facility able to manage it.  Physician at [facility] who does the necessary procedure does not have any availability.  [facility] unable to accept patient because of lack of bed space.  Patient was signed out to me pending callback from [facility]  It has now been 90 minutes since signout, and [employer] [facility] has not called back.  I have contacted [facility], but they have no bed availability for transfers.  At this point therefore, we will need to admit the patient for pain control and continuing attempts to have her sent to a facility that can do appropriate drainage procedure.    Call back has been received from [facility]  Case was presented to Dr. Andrey Spearman who states that patient will be placed on their wait list, but advises that it is a fairly lengthy wait list.  We will plan to admit here pending transfer to [place] or to [facility] when provider there is available.  Case is discussed with Dr. Sidney Ace of [facility] hospitalists, who requests admission decision be deferred to the daytime hospitalist in hopes that patient may be able to get a bed at one of the other facilities which is capable of doing the drainage procedure.  Daytime hospitalist has been paged.  Case is signed out to Dr. Langston Masker.   Delora Fuel, MD 01/77/93 [phone]

## 2021-05-31 NOTE — Hospital Course (Addendum)
66 year old female with history of GERD, hyperlipidemia, hypertension, osteoarthritis, diverticulosis, anxiety and depression, recently discharged after having a bout of gallstone pancreatitis and subsequent cholecystectomy reports that she initially had been doing well but started having increasing abdominal pain that never fully resolved.  She reports that she has had poor appetite and low tolerance for eating due to intermittent bouts of abdominal pain.  She saw her PCP recently and had labs done that revealed an elevated lipase.  She reported to her surgeon yesterday that she was having 8 out of 10 epigastric abdominal pain that was not improving with pain medications and noted to have a leukocytosis and was sent to the emergency department.  Her repeated labs showed an elevated white count and LFTs were normal.  CT of the abdomen and pelvis demonstrated signs of subacute pancreatitis with a large mature bilobed pseudocyst with marked mass effect upon the posterior aspect of the stomach, extrahepatic portal vein and portal venous confluence, signs of splenic vein occlusion, and no bile duct obstruction.  GI was called yesterday and Dr. Paulita Fujita at [facility] was contacted and discussed the case with Dr. Rush Landmark who has no availability to do the procedure and recommended transfer to another facility.  [facility] and [facility] unable to accept patient because of lack of bed space.       Call back has been received from [facility].  Case was presented to Dr. Andrey Spearman who states that patient will be placed on their wait list.   Case was discussed the case with Dr. Roxanne Mins who indicated that the gastroenterologist will accept when there is a bed available and that may take time.  I was called to admit patient this morning to [facility] but after discussion with GI service at [facility] they recommended ED to ED transfer as we do not offer the treatment that she needs here at [facility] and  would be at risk for a longer delay of care.  Decision was made to do a medical consultation while patient boarding in ED waiting on bed at [facility]  I spoke with ED. He says that after speaking with ED at [facility] she likely won't get a bed until later in the week.

## 2021-05-31 NOTE — Assessment & Plan Note (Signed)
--   secondary to pancreatic pseudocyst  -- awaiting transfer to [facility] for treatment

## 2021-05-31 NOTE — Telephone Encounter (Signed)
Pt called in wants you to know she is admitted at [facility]. They found a cyst that needs to be drained but cant find a surgeon to drain it.

## 2021-05-31 NOTE — ED Notes (Signed)
Pt placed on hospital bed for comfort.  Updated pt to let her know that she would be transferred to [facility] as soon as a bed became available and the she would remain in our ER until that happened.

## 2021-05-31 NOTE — Consult Note (Signed)
Gastroenterology Consult   Referring Provider: No ref. provider found Primary Care Physician:  Denita Lung, MD Primary Gastroenterologist:  Dr. Gala Romney (previously unassigned)  Patient ID: Tally Joe; [id number]; [birth year]   Admit date: 05/30/2021  LOS: 0 days   Date of Consultation: 05/31/2021  Reason for Consultation:  pancreatitis, large pancreatic pseudocyst on imaging  History of Present Illness   SHANNIN NAAB is a 66 y.o. year old female with history of anxiety, depression, diverticulosis, GERD, HLD, and HTN who recently underwent laparoscopic cholecystectomy on 5/12.  She had a visit with her PCP on 5/23 and had blood work performed revealing leukocytosis and elevated lipase and amylase, she was advised to proceed to the ED.  She presented to the ED with worsening abdominal pain and distention since being discharged from home.  She was to have follow-up with Dr. Okey Dupre today but presented to the ED due to escalating pain.  Recent labs from 5/23 with elevated lipase to 97, amylase 216, alk phos 133, normal LFTs, normal CBC.   ED Course: Labs with repeat lipase elevated to 104, normal LFTs and alk phos, Hgb 11.7. Abdominal x-ray with nonspecific bowel gas pattern, clips in the gallbladder fossa CT A/P with contrast with trace fluid in the gallbladder fossa, no intrahepatic or CBD dilation, subacute pancreatitis identified with large mature bowel bed pseudocyst originating from the head through the tail of the pancreas (measuring up to 16.1 cm) and extends to the posterior aspect of the stomach, additional smaller satellite pseudocyst noted slightly lower level within the ventral abdomen, splenic vein occlusion with increased collateral vessel formation.   Consult: Patient reports that she is continue to have a little bit of a lack of appetite since her cholecystectomy.  Thinks she may have only lost about 4 pounds in the last 2 weeks.  Pain improved slightly  with her pain medication on discharge, however pain began to worsen over the last few days that was more mid abdomen extending to her back.  She went to see her PCP on Tuesday due to worsening pain who advised her to go to the ED.  She contacted general surgery's office as well to ask about her pain as she was due to see them in the office today.  She stated she came to the ED because she felt like she could not wait anymore.  She denies any nausea or vomiting.  She states she is passing gas.  She has only had 1 bowel movement since her cholecystectomy.  She stated that she did take MiraLAX daily for 3 days after going home but did not think it was helpful.  She took the Senokot the other day was able to have a small bowel movement.   Past Medical History:  Diagnosis Date   Abnormal EKG    Abnormal pap    s/p cryotherapy   Anxiety    Arthritis    Depression    Diverticulosis    GERD (gastroesophageal reflux disease)    Hyperlipidemia    Hypertension    S/P colonoscopic polypectomy     Past Surgical History:  Procedure Laterality Date   CHOLECYSTECTOMY N/A 05/18/2021   Procedure: LAPAROSCOPIC CHOLECYSTECTOMY;  Surgeon: Rusty Aus, DO;  Location: [facility];  Service: General;  Laterality: N/A;   COLONOSCOPY  03/18/11; 2000   Dr. Amedeo Plenty; diverticulosis in sigmoid colon   crysurgery  age 39   for abnormal pap   LAMINECTOMY  03/2002   TOTAL KNEE ARTHROPLASTY  Right 10/02/2018   TOTAL KNEE ARTHROPLASTY Right 10/02/2018   Procedure: RIGHT TOTAL KNEE ARTHROPLASTY;  Surgeon: Newt Minion, MD;  Location: [place];  Service: Orthopedics;  Laterality: Right;    Prior to Admission medications   Medication Sig Start Date End Date Taking? Authorizing Provider  acetaminophen (TYLENOL) 650 MG CR tablet Take 650 mg by mouth every 8 (eight) hours as needed for pain.   Yes [provider]  diclofenac Sodium (VOLTAREN) 1 % GEL Apply 2 g topically 4 (four) times daily as needed (pain).  05/22/21  Yes Johnson, Clanford L, MD  ibuprofen (ADVIL) 800 MG tablet Take 1 tablet (800 mg total) by mouth every 8 (eight) hours as needed for mild pain. 05/22/21  Yes Johnson, Clanford L, MD  lisinopril-hydrochlorothiazide (ZESTORETIC) 10-12.5 MG tablet Take 1 tablet by mouth daily. 11/22/20  Yes Denita Lung, MD  oxyCODONE (ROXICODONE) 5 MG immediate release tablet Take 1 tablet (5 mg total) by mouth every 6 (six) hours as needed for up to 7 days for severe pain. 05/24/21 05/31/21 Yes Pappayliou, Barnetta Chapel A, DO  pravastatin (PRAVACHOL) 40 MG tablet Take 1 tablet (40 mg total) by mouth daily. 11/22/20  Yes Denita Lung, MD  senna-docusate (SENOKOT-S) 8.6-50 MG tablet Take 2 tablets by mouth at bedtime. 05/22/21  Yes Murlean Iba, MD    Current Facility-Administered Medications  Medication Dose Route Frequency Provider Last Rate Last Admin   hydrochlorothiazide (HYDRODIURIL) tablet 12.5 mg  12.5 mg Oral Daily Wyvonnia Dusky, MD   12.5 mg at 05/31/21 1031   ketorolac (TORADOL) 30 MG/ML injection 30 mg  30 mg Intravenous Q6H PRN Johnson, Clanford L, MD       lactated ringers infusion   Intravenous Continuous Wynetta Emery, Clanford L, MD 150 mL/hr at 05/31/21 1149 New Bag at 05/31/21 1149   lisinopril (ZESTRIL) tablet 10 mg  10 mg Oral Daily Wyvonnia Dusky, MD   10 mg at 05/31/21 1031   morphine (PF) 4 MG/ML injection 3 mg  3 mg Intravenous Q3H PRN Wynetta Emery, Clanford L, MD   3 mg at 05/31/21 1341   ondansetron (ZOFRAN) injection 4 mg  4 mg Intravenous Q6H PRN Johnson, Clanford L, MD       potassium chloride 10 mEq in 100 mL IVPB  10 mEq Intravenous Q1 Hr x 3 Johnson, Clanford L, MD 100 mL/hr at 05/31/21 1348 10 mEq at 05/31/21 1348   pravastatin (PRAVACHOL) tablet 40 mg  40 mg Oral Daily Wyvonnia Dusky, MD   40 mg at 05/31/21 1031   prochlorperazine (COMPAZINE) injection 10 mg  10 mg Intravenous Q4H PRN Johnson, Clanford L, MD       senna-docusate (Senokot-S) tablet 2 tablet  2 tablet  Oral QHS Wyvonnia Dusky, MD       Current Outpatient Medications  Medication Sig Dispense Refill   acetaminophen (TYLENOL) 650 MG CR tablet Take 650 mg by mouth every 8 (eight) hours as needed for pain.     diclofenac Sodium (VOLTAREN) 1 % GEL Apply 2 g topically 4 (four) times daily as needed (pain).     ibuprofen (ADVIL) 800 MG tablet Take 1 tablet (800 mg total) by mouth every 8 (eight) hours as needed for mild pain.     lisinopril-hydrochlorothiazide (ZESTORETIC) 10-12.5 MG tablet Take 1 tablet by mouth daily. 90 tablet 3   oxyCODONE (ROXICODONE) 5 MG immediate release tablet Take 1 tablet (5 mg total) by mouth every 6 (six) hours as  needed for up to 7 days for severe pain. 28 tablet 0   pravastatin (PRAVACHOL) 40 MG tablet Take 1 tablet (40 mg total) by mouth daily. 90 tablet 1   senna-docusate (SENOKOT-S) 8.6-50 MG tablet Take 2 tablets by mouth at bedtime. 20 tablet 0    Allergies as of 05/30/2021   (No Known Allergies)    Family History  Problem Relation Age of Onset   Alzheimer's disease Mother    Hyperlipidemia Mother    Other Father        died of gunshot wound   Osteoporosis Sister    Osteoporosis Sister    ALS Maternal Aunt    ALS Maternal Uncle    Diabetes Paternal Aunt    Cancer Maternal Grandfather        lung cancer   Cancer Paternal Grandmother        ?type   Heart disease Neg Hx     Social History   Socioeconomic History   Marital status: Married    Spouse name: Not on file   Number of children: 1   Years of education: Not on file   Highest education level: Not on file  Occupational History   Occupation: quality control    Employer: [employer]  Tobacco Use   Smoking status: Never   Smokeless tobacco: Never  Vaping Use   Vaping Use: Never used  Substance and Sexual Activity   Alcohol use: Yes    Comment: glass of wine 1-2 times per week.   Drug use: No   Sexual activity: Yes    Partners: Male    Comment: postmenopausal  Other  Topics Concern   Not on file  Social History Narrative   Lives with husband and daughter Chiropractor), 1 dog, 1 cat   Social Determinants of Health   Financial Resource Strain: Not on file  Food Insecurity: Not on file  Transportation Needs: Not on file  Physical Activity: Not on file  Stress: Not on file  Social Connections: Not on file  Intimate Partner Violence: Not on file     Review of Systems   Gen: Denies any fever, chills, loss of appetite, change in weight or weight loss CV: Denies chest pain, heart palpitations, syncope, edema  Resp: Denies shortness of breath with rest, cough, wheezing, coughing up blood, and pleurisy. GI: see HPI GU : Denies urinary burning, blood in urine, urinary frequency, and urinary incontinence. MS: Denies joint pain, limitation of movement, swelling, cramps, and atrophy.  Derm: Denies rash, itching, dry skin, hives. Psych: Denies depression, anxiety, memory loss, hallucinations, and confusion. Heme: Denies bruising or bleeding Neuro:  Denies any headaches, dizziness, paresthesias, shaking  Physical Exam   Vital Signs in last 24 hours: Temp:  [98.7 F (37.1 C)] 98.7 F (37.1 C) (05/24 2047) Pulse Rate:  [73-101] 76 (05/25 1230) Resp:  [14-20] 16 (05/25 1230) BP: (102-150)/(57-101) 115/74 (05/25 1230) SpO2:  [91 %-100 %] 93 % (05/25 1230)    General:   Alert,  Well-developed, well-nourished, pleasant and cooperative in NAD Head:  Normocephalic and atraumatic. Eyes:  Sclera clear, no icterus.   Conjunctiva pink. Ears:  Normal auditory acuity. Lungs:  Clear throughout to auscultation.   No wheezes, crackles, or rhonchi. No acute distress. Heart:  Regular rate and rhythm; no murmurs, clicks, rubs,  or gallops. Abdomen:  Soft, tenderness to epigastrium, LUQ, non-distended. No masses, hepatosplenomegaly or hernias noted. Normal bowel sounds, without guarding, and without rebound.   Rectal:  deferred Msk:  Symmetrical without gross  deformities. Normal posture. Extremities:  Without clubbing or edema. Neurologic:  Alert and  oriented x4. Skin:  Intact without significant lesions or rashes. Psych:  Alert and cooperative. Normal mood and affect.  Intake/Output from previous day: No intake/output data recorded. Intake/Output this shift: Total I/O In: 100 [IV Piggyback:100] Out: -    Labs/Studies   Recent Labs Recent Labs    05/29/21 1350 05/30/21 1322  WBC 15.2* 16.3*  HGB 12.1 11.7*  HCT 36.1 36.0  PLT 592* 577*   BMET Recent Labs    05/29/21 1350 05/30/21 1322  NA 138 136  K 4.0 3.4*  CL 98 101  CO2 24 27  GLUCOSE 145* 106*  BUN 8 10  CREATININE 0.77 0.75  CALCIUM 8.7 8.8*   LFT Recent Labs    05/29/21 1350 05/30/21 1322  PROT 6.7 7.4  ALBUMIN 3.7* 3.3*  AST 21 20  ALT 15 15  ALKPHOS 133* 115  BILITOT 0.4 0.5   PT/INR No results for input(s): LABPROT, INR in the last 72 hours. Hepatitis Panel No results for input(s): HEPBSAG, HCVAB, HEPAIGM, HEPBIGM in the last 72 hours. C-Diff No results for input(s): CDIFFTOX in the last 72 hours.  Radiology/Studies CT ABDOMEN PELVIS W CONTRAST  Result Date: 05/30/2021 CLINICAL DATA:  Abdominal pain. Status post cholecystectomy 2 weeks ago. EXAM: CT ABDOMEN AND PELVIS WITH CONTRAST TECHNIQUE: Multidetector CT imaging of the abdomen and pelvis was performed using the standard protocol following bolus administration of intravenous contrast. RADIATION DOSE REDUCTION: This exam was performed according to the departmental dose-optimization program which includes automated exposure control, adjustment of the mA and/or kV according to patient size and/or use of iterative reconstruction technique. CONTRAST:  128m OMNIPAQUE IOHEXOL 300 MG/ML  SOLN COMPARISON:  None Available. FINDINGS: Lower chest: Scar versus subsegmental atelectasis noted within both lung bases. No pleural effusion. Hepatobiliary: No suspicious liver abnormality. Signs of recent  cholecystectomy. Trace fluid noted within the gallbladder fossa. No intrahepatic or common bile duct dilatation. Pancreas: Signs of subacute pancreatitis identified with large, mature bilobed pseudocyst originating from the head through tail of pancreas. The pseudocyst measures 16.1 x 9.1 by 10.7 cm and extends into the lesser sac with marked mass effect upon the posterior aspect of the stomach. Additional smaller satellite pseudocysts are noted at a slightly lower level within the ventral abdomen as noted on image 44/2. Spleen: Normal in size without focal abnormality. Adrenals/Urinary Tract: Normal adrenal glands. No nephrolithiasis, hydronephrosis or mass. Urinary bladder is unremarkable. Stomach/Bowel: The gastric lumen is compressed by large pseudo cyst. The stomach is pressed against the ventral abdominal wall. No significant small or large bowel wall thickening, inflammation, or distension. Sigmoid diverticulosis without signs of acute diverticulitis. Vascular/Lymphatic: Normal appearance of the abdominal aorta. Prominent retroperitoneal lymph nodes likely reflect reactive adenopathy. No signs of abdominopelvic adenopathy. The intrahepatic portal vein is patent. Pseudo cyst exhibits marked mass-effect upon the extrahepatic portal vein and portal venous confluence without signs of occlusion. The splenic vein is not visualized and is presumed to be a chronically occluded with increased upper abdominal collateral vessel formation. Reproductive: Small partially exophytic calcified fibroid noted arising off the uterine fundus. No signs of adnexal mass. Other: Small volume of free fluid noted within the posterior pelvis. Musculoskeletal: No acute or suspicious osseous findings. Degenerative disc disease noted within the lower thoracic and lower sub lumbar spine. IMPRESSION: 1. Signs of subacute pancreatitis with large, mature bilobed pseudocyst with marked mass effect  upon the posterior aspect of the stomach,  extrahepatic portal vein and portal venous confluence. 2. Signs of splenic vein occlusion identified with increased upper abdominal collateral formation. 3. No signs of bile duct obstruction. 4. Small volume of free fluid within the posterior pelvis. 5. Sigmoid diverticulosis without signs of acute diverticulitis. Electronically Signed   By: Kerby Moors M.D.   On: 05/30/2021 18:47   DG ABD ACUTE 2+V W 1V CHEST  Result Date: 05/30/2021 CLINICAL DATA:  Abdominal distention, status post cholecystectomy EXAM: DG ABDOMEN ACUTE WITH 1 VIEW CHEST COMPARISON:  None Available. FINDINGS: Transverse diameter of heart is slightly increased. There are no signs of pulmonary edema. Small left pleural effusion is seen. There are small linear densities in the lateral aspect of left lower lung fields. There is no focal pulmonary consolidation. There is no pneumothorax. Bowel gas pattern is nonspecific. There is no dilation of small-bowel loops. Gas and stool are present in colon. Surgical clips are seen in gallbladder fossa. No abnormal masses or calcifications are seen. Degenerative changes are noted in the lumbar spine. IMPRESSION: Bowel gas pattern is nonspecific. Small left pleural effusion. There are no signs of pulmonary edema or focal pulmonary consolidation. Electronically Signed   By: Elmer Picker M.D.   On: 05/30/2021 15:45     Assessment   Rokhaya ETHELENE CLOSSER is a 66 y.o. year old female with a history of anxiety, depression, diverticulosis, GERD, HLD, and HTN who recently underwent laparoscopic cholecystectomy on 5/12.  Patient presented to the ED with abdominal pain with elevated lipase and CT imaging of pancreatitis with large pseudocyst.  GI consulted for further recommendation.  Acute pancreatitis: Patient recent lab work with elevated lipase and amylase.  Lipase on admission elevated 104.  She has CT evidence of acute pancreatitis measuring up to 16 cm and extending to the posterior aspect of the  stomach with additional smaller satellite pseudocyst.  She has been able to tolerate clear liquid diet so far.  ED physician reached out to [facility] for recommendations and admission, however advance pancreatic endoscopist is unavailable.  Her case has been discussed by the ED physician with multiple other tertiary centers including [place], [place], and [place].  She has been accepted by [facility] and put on the wait list for a bed.  She needs drainage of this large pseudocyst that cannot be done here at [facility].  Recommend treating supportively with IV fluids, antiemetics, and pain medication.  Continue clear liquid diet as patient tolerates.  Continue supportive measures. Patient is boarding in the ED and not admitted to hospital in hopes to expedite transfer.   Plan / Recommendations   Continue supportive measures with IV fluids Antiemetic and pain management per consulting hospitalist Continue clear liquid diet as tolerated Patient needs drainage of pseudocyst at tertiary center, Dr. Rush Landmark with [facility] unavailable.  Patient accepted by [facility] but awaiting available bed.     05/31/2021, 2:39 PM  Venetia Night, MSN, FNP-BC, AGACNP-BC [facility]

## 2021-05-31 NOTE — Assessment & Plan Note (Signed)
--   home BP meds reordered

## 2021-05-31 NOTE — ED Provider Notes (Addendum)
66 yo female pending possible transfer for complex GI drainage procedure - patient on waiting list at [facility], but lengthy expected wait.  Patient here needing pain control - will likely need hospitalist admission while awaiting transfer to [facility]  0900  -after discussion with the hospitalist Dr Wynetta Emery, the medical team at [facility] will consult on the patient daily, as well as the gastroenterology team.  However the patient will board in the ER pending transfer to [facility].  I did attempt to reach the [facility] transfer center to see if the patient be eligible for an ER to ER transfer, but they are not excepting these transfers at the moment, unless she were to develop clinical decompensation.  She does not have an acute surgical emergency at this time.  She is needing pain control and nausea control with poor p.o. intake.  She is still on a wait list pending transfer to [facility] for definitive GI intervention.    Wyvonnia Dusky, MD 05/31/21 8811    Wyvonnia Dusky, MD 05/31/21 [phone]

## 2021-05-31 NOTE — Assessment & Plan Note (Addendum)
--   continue pain management and supportive measures while awaiting transfer to [facility] for treatment  -- clear liquid diet only for now as that is all she can tolerate at this time.

## 2021-05-31 NOTE — Consult Note (Signed)
[facility] Consult Note  Amber Church VPX:[id number],IOE:[id number]  Patients out patient PCP is Amber Lung, MD Consult requested in the Hospital by Default, Provider, MD, On 05/31/2021  Reason for consult : acute pancreatitis with pseudocyst, abdominal pain   With History of - Principal Problem:   Acute pancreatitis Active Problems:   Colicky RUQ abdominal pain   Hypertension   Hyperlipidemia LDL goal <100   Leukocytosis   Pancreatic pseudocyst  Past Medical History:  Diagnosis Date   Abnormal EKG    Abnormal pap    s/p cryotherapy   Anxiety    Arthritis    Depression    Diverticulosis    GERD (gastroesophageal reflux disease)    Hyperlipidemia    Hypertension    S/P colonoscopic polypectomy      Past Surgical History:  Procedure Laterality Date   CHOLECYSTECTOMY N/A 05/18/2021   Procedure: LAPAROSCOPIC CHOLECYSTECTOMY;  Surgeon: Rusty Aus, DO;  Location: [facility];  Service: General;  Laterality: N/A;   COLONOSCOPY  03/18/11; 2000   Dr. Amedeo Plenty; diverticulosis in sigmoid colon   crysurgery  age 79   for abnormal pap   LAMINECTOMY  03/2002   TOTAL KNEE ARTHROPLASTY Right 10/02/2018   TOTAL KNEE ARTHROPLASTY Right 10/02/2018   Procedure: RIGHT TOTAL KNEE ARTHROPLASTY;  Surgeon: Newt Minion, MD;  Location: [place];  Service: Orthopedics;  Laterality: Right;    Past Surgical History:  Procedure Laterality Date   CHOLECYSTECTOMY N/A 05/18/2021   Procedure: LAPAROSCOPIC CHOLECYSTECTOMY;  Surgeon: Rusty Aus, DO;  Location: [facility];  Service: General;  Laterality: N/A;   COLONOSCOPY  03/18/11; 2000   Dr. Amedeo Plenty; diverticulosis in sigmoid colon   crysurgery  age 40   for abnormal pap   LAMINECTOMY  03/2002   TOTAL KNEE ARTHROPLASTY Right 10/02/2018   TOTAL KNEE ARTHROPLASTY Right 10/02/2018   Procedure: RIGHT TOTAL KNEE ARTHROPLASTY;  Surgeon: Newt Minion, MD;  Location: [place];  Service: Orthopedics;  Laterality: Right;    HPI:-   Amber Church CSN:[id number] is a 66 year old female with history of GERD, hyperlipidemia, hypertension, osteoarthritis, diverticulosis, anxiety and depression, recently discharged after having a bout of gallstone pancreatitis and subsequent cholecystectomy reports that she initially had been doing well but started having increasing abdominal pain that never fully resolved.  She reports that she has had poor appetite and low tolerance for eating due to intermittent bouts of abdominal pain.  She saw her PCP recently and had labs done that revealed an elevated lipase.  She reported to her surgeon yesterday that she was having 8 out of 10 epigastric abdominal pain that was not improving with pain medications and noted to have a leukocytosis and was sent to the emergency department.  Her repeated labs showed an elevated white count and LFTs were normal.  CT of the abdomen and pelvis demonstrated signs of subacute pancreatitis with a large mature bilobed pseudocyst with marked mass effect upon the posterior aspect of the stomach, extrahepatic portal vein and portal venous confluence, signs of splenic vein occlusion, and no bile duct obstruction.  GI was called yesterday and Dr. Paulita Fujita at [facility] was contacted and discussed the case with Dr. Rush Landmark who has no availability to do the procedure and recommended transfer to another facility.  [facility] and [facility] unable to accept patient because of lack of bed space.       Call back has been received from [facility].  Case was  presented to Dr. Andrey Spearman who states that patient will be placed on their wait list.   Case was discussed the case with Dr. Roxanne Mins who indicated that the gastroenterologist will accept when there is a bed available and that may take time.  I was called to admit patient this morning to [facility] but after discussion with GI service at [facility] they recommended ED to ED transfer as we do not  offer the treatment that she needs here at [facility] and would be at risk for a longer delay of care.  Decision was made to do a medical consultation while patient boarding in ED waiting on bed at [facility].  I spoke with ED. He says that after speaking with ED at [facility] she likely won't get a bed until tomorrow.     Review of Systems   Review of Systems  Constitutional:  Negative for fever.  HENT: Negative.    Cardiovascular: Negative.   Gastrointestinal:  Positive for abdominal pain, heartburn and nausea. Negative for diarrhea and vomiting.  Genitourinary: Negative.   Musculoskeletal: Negative.   Skin: Negative.   Neurological: Negative.   Endo/Heme/Allergies: Negative.   Psychiatric/Behavioral: Negative.    All other systems reviewed and are negative.  Social History Social History   Tobacco Use   Smoking status: Never   Smokeless tobacco: Never  Substance Use Topics   Alcohol use: Yes    Comment: glass of wine 1-2 times per week.     Family History Family History  Problem Relation Age of Onset   Alzheimer's disease Mother    Hyperlipidemia Mother    Other Father        died of gunshot wound   Osteoporosis Sister    Osteoporosis Sister    ALS Maternal Aunt    ALS Maternal Uncle    Diabetes Paternal Aunt    Cancer Maternal Grandfather        Church cancer   Cancer Paternal Grandmother        ?type   Heart disease Neg Hx      Prior to Admission medications   Medication Sig Start Date End Date Taking? Authorizing Provider  acetaminophen (TYLENOL) 650 MG CR tablet Take 650 mg by mouth every 8 (eight) hours as needed for pain.   Yes [provider]  diclofenac Sodium (VOLTAREN) 1 % GEL Apply 2 g topically 4 (four) times daily as needed (pain). 05/22/21  Yes Benedetta Sundstrom L, MD  ibuprofen (ADVIL) 800 MG tablet Take 1 tablet (800 mg total) by mouth every 8 (eight) hours as needed for mild pain. 05/22/21  Yes Refujio Haymer L, MD  lisinopril-hydrochlorothiazide  (ZESTORETIC) 10-12.5 MG tablet Take 1 tablet by mouth daily. 11/22/20  Yes Amber Lung, MD  oxyCODONE (ROXICODONE) 5 MG immediate release tablet Take 1 tablet (5 mg total) by mouth every 6 (six) hours as needed for up to 7 days for severe pain. 05/24/21 05/31/21 Yes Pappayliou, Barnetta Chapel A, DO  pravastatin (PRAVACHOL) 40 MG tablet Take 1 tablet (40 mg total) by mouth daily. 11/22/20  Yes Amber Lung, MD  senna-docusate (SENOKOT-S) 8.6-50 MG tablet Take 2 tablets by mouth at bedtime. 05/22/21  Yes Rosezetta Balderston L, MD    No Known Allergies  Physical Exam No intake or output data in the 24 hours ending 05/31/21 1100 Blood pressure (!) 129/93, pulse 94, temperature 98.7 F (37.1 C), resp. rate 16, height '5\' 4"'$  (1.626 m), weight 77 kg, SpO2 94 %.  Physical Exam Constitutional:  General: She is not in acute distress.    Appearance: She is well-developed and normal weight. She is not ill-appearing or diaphoretic.  HENT:     Head: Normocephalic and atraumatic.     Mouth/Throat:     Mouth: Mucous membranes are moist.  Eyes:     Extraocular Movements: Extraocular movements intact.     Pupils: Pupils are equal, round, and reactive to light.  Cardiovascular:     Rate and Rhythm: Normal rate and regular rhythm.  Pulmonary:     Effort: Pulmonary effort is normal.  Abdominal:     General: Abdomen is flat. Bowel sounds are normal. There is no distension.     Palpations: Abdomen is soft. There is no fluid wave, splenomegaly or mass.     Tenderness: There is abdominal tenderness in the right upper quadrant and epigastric area.  Skin:    General: Skin is warm and dry.  Neurological:     General: No focal deficit present.     Mental Status: She is alert.  Psychiatric:        Mood and Affect: Mood normal. Mood is not anxious or depressed.        Behavior: Behavior normal.   Data Review CBC w Diff:  Lab Results  Component Value Date   WBC 16.3 (H) 05/30/2021   HGB 11.7 (L)  05/30/2021   HGB 12.1 05/29/2021   HCT 36.0 05/30/2021   HCT 36.1 05/29/2021   PLT 577 (H) 05/30/2021   PLT 592 (H) 05/29/2021   LYMPHOPCT 12 05/22/2021   MONOPCT 7 05/22/2021   EOSPCT 1 05/22/2021   BASOPCT 1 05/22/2021    CMP:  Lab Results  Component Value Date   NA 136 05/30/2021   NA 138 05/29/2021   K 3.4 (L) 05/30/2021   CL 101 05/30/2021   CO2 27 05/30/2021   BUN 10 05/30/2021   BUN 8 05/29/2021   CREATININE 0.75 05/30/2021   CREATININE 0.89 01/15/2016   PROT 7.4 05/30/2021   PROT 6.7 05/29/2021   ALBUMIN 3.3 (L) 05/30/2021   ALBUMIN 3.7 (L) 05/29/2021   BILITOT 0.5 05/30/2021   BILITOT 0.4 05/29/2021   ALKPHOS 115 05/30/2021   AST 20 05/30/2021   ALT 15 05/30/2021    Coagulation:  Lab Results  Component Value Date   INR 1.0 09/25/2018    Cardiac markers: No results found for: CKMB, TROPONINI, MYOGLOBIN  Assessment & Plan  Assessment and Plan: * Acute pancreatitis -- continue pain management and supportive measures while awaiting transfer to [facility] for treatment  -- clear liquid diet only for now as that is all she can tolerate at this time  Colicky RUQ abdominal pain -- secondary to acute pancreatitis with pseudocyst -- pt waiting transfer to [facility] for endoscopic lysis of cyst -- continue pain management as ordered and supportive measures while waiting for bed at [facility]  Pancreatic pseudocyst -- Pt accepted by GI to [facility] for endoscopic treatment   Leukocytosis -- secondary to pancreatic pseudocyst  -- awaiting transfer to [facility] for treatment   Hyperlipidemia LDL goal <100 -- resumed home pravastatin  Hypertension -- home BP meds reordered   Thank you for the consult, [facility] will follow the patient with you.  Bernetta Sutley James Ivanoff, MD How to contact the [facility] Attending or Consulting provider [place] or covering provider during after hours [place], for this patient?  Check the care team in [facility] and look for a) attending/consulting [facility]  provider listed  and b) the [facility] team listed Log into [email or web address] and use [place]'s universal password to access. If you do not have the password, please contact the hospital operator. Locate the [facility] provider you are looking for under [facility] and page to a number that you can be directly reached. If you still have difficulty reaching the provider, please page the [facility] (Director on Call) for the Hospitalists listed on amion for assistance.

## 2021-05-31 NOTE — ED Notes (Signed)
Supper tray given to pt. Nurse notified

## 2021-05-31 NOTE — Assessment & Plan Note (Addendum)
--   Pt accepted by GI to [facility] for endoscopic treatment of pseudocyst.  -- update from [facility]: no bed expected until Sunday.  I spoke with administration at [facility] and ok to admit to bed on unit and get out of ED.

## 2021-05-31 NOTE — Assessment & Plan Note (Signed)
--   resumed home pravastatin

## 2021-05-31 NOTE — Progress Notes (Signed)
[facility]  Patient called my office yesterday, complaining of 8 out of 10 epigastric abdominal pain that was not improving with pain medications.  She had presented to her primary care doctor the day prior, and underwent blood work which demonstrated an elevated lipase at 97, elevated amylase at 216, and a leukocytosis of 15.2.  Given this blood work and her symptoms, I advised the patient to present to the emergency department.  In the ED she underwent repeat blood work which again demonstrated a leukocytosis of 16.3.  LFTs were within normal limits.  She underwent a CT abdomen and pelvis which demonstrated signs of subacute pancreatitis with a large, mature bilobed pseudocyst with marked mass effect upon the posterior aspect of the stomach, extrahepatic portal vein, and portal venous confluence, signs of splenic vein occlusion, and no bile duct obstruction.  Patient seen and examined.  She says that her pain is adequately controlled at this time.  She was able to tolerate a small amount of a clear liquid diet.  She denies any issues at her incision sites.  Exam: Abdomen: Abdomen soft, moderate distention, no percussion tenderness, tenderness to palpation in the epigastrium; incision C/D/I, Dermabond flaking off  Plan: -I explained to the patient that she is doing well from a post-cholecystectomy standpoint -Unfortunately, pseudocyst formation is a known complication of pancreatitis, which she has developed -She will require endoscopic drainage of the cyst, which is unable to be performed at [place] transfer to either [facility] or tertiary care facility for management -Care per primary team -Please call with any questions or concerns  Graciella Freer, DO [facility] [address] [place], [place] [address] [phone] (office)

## 2021-06-01 DIAGNOSIS — E8809 Other disorders of plasma-protein metabolism, not elsewhere classified: Secondary | ICD-10-CM | POA: Diagnosis present

## 2021-06-01 DIAGNOSIS — D75839 Thrombocytosis, unspecified: Secondary | ICD-10-CM | POA: Diagnosis present

## 2021-06-01 DIAGNOSIS — D649 Anemia, unspecified: Secondary | ICD-10-CM | POA: Diagnosis present

## 2021-06-01 DIAGNOSIS — E785 Hyperlipidemia, unspecified: Secondary | ICD-10-CM | POA: Diagnosis not present

## 2021-06-01 DIAGNOSIS — K8500 Idiopathic acute pancreatitis without necrosis or infection: Secondary | ICD-10-CM | POA: Diagnosis not present

## 2021-06-01 DIAGNOSIS — K863 Pseudocyst of pancreas: Secondary | ICD-10-CM | POA: Diagnosis not present

## 2021-06-01 DIAGNOSIS — D72829 Elevated white blood cell count, unspecified: Secondary | ICD-10-CM

## 2021-06-01 DIAGNOSIS — R1011 Right upper quadrant pain: Secondary | ICD-10-CM | POA: Diagnosis not present

## 2021-06-01 LAB — COMPREHENSIVE METABOLIC PANEL
ALT: 13 U/L (ref 0–44)
AST: 18 U/L (ref 15–41)
Albumin: 2.8 g/dL — ABNORMAL LOW (ref 3.5–5.0)
Alkaline Phosphatase: 92 U/L (ref 38–126)
Anion gap: 6 (ref 5–15)
BUN: 8 mg/dL (ref 8–23)
CO2: 28 mmol/L (ref 22–32)
Calcium: 8.4 mg/dL — ABNORMAL LOW (ref 8.9–10.3)
Chloride: 104 mmol/L (ref 98–111)
Creatinine, Ser: 0.65 mg/dL (ref 0.44–1.00)
GFR, Estimated: >60 mL/min (ref >60)
Glucose, Bld: 109 mg/dL — ABNORMAL HIGH (ref 70–99)
Potassium: 3.8 mmol/L (ref 3.5–5.1)
Sodium: 138 mmol/L (ref 135–145)
Total Bilirubin: 0.5 mg/dL (ref 0.3–1.2)
Total Protein: 6 g/dL — ABNORMAL LOW (ref 6.5–8.1)

## 2021-06-01 LAB — CBC WITH DIFFERENTIAL/PLATELET
Abs Immature Granulocytes: 0.05 10*3/uL (ref 0.00–0.07)
Basophils Absolute: 0.1 10*3/uL (ref 0.0–0.1)
Basophils Relative: 1 %
Eosinophils Absolute: 0.2 10*3/uL (ref 0.0–0.5)
Eosinophils Relative: 2 %
HCT: 32.2 % — ABNORMAL LOW (ref 36.0–46.0)
Hemoglobin: 10.4 g/dL — ABNORMAL LOW (ref 12.0–15.0)
Immature Granulocytes: 1 %
Lymphocytes Relative: 18 %
Lymphs Abs: 1.7 10*3/uL (ref 0.7–4.0)
MCH: 29.3 pg (ref 26.0–34.0)
MCHC: 32.3 g/dL (ref 30.0–36.0)
MCV: 90.7 fL (ref 80.0–100.0)
Monocytes Absolute: 0.8 10*3/uL (ref 0.1–1.0)
Monocytes Relative: 8 %
Neutro Abs: 7 10*3/uL (ref 1.7–7.7)
Neutrophils Relative %: 70 %
Platelets: 464 10*3/uL — ABNORMAL HIGH (ref 150–400)
RBC: 3.55 MIL/uL — ABNORMAL LOW (ref 3.87–5.11)
RDW: 13.7 % (ref 11.5–15.5)
WBC: 9.9 10*3/uL (ref 4.0–10.5)
nRBC: 0 % (ref 0.0–0.2)

## 2021-06-01 LAB — MAGNESIUM: Magnesium: 2 mg/dL (ref 1.7–2.4)

## 2021-06-01 MED ORDER — POLYETHYLENE GLYCOL 3350 17 G PO PACK
17.0000 g | PACK | Freq: Two times a day (BID) | ORAL | Status: DC
  Administered 2021-06-01 – 2021-06-02 (×3): 17 g via ORAL
  Filled 2021-06-01 (×4): qty 1

## 2021-06-01 MED ORDER — LACTATED RINGERS IV SOLN: INTRAVENOUS | Status: DC

## 2021-06-01 MED ORDER — HEPARIN SODIUM (PORCINE) 5000 UNIT/ML IJ SOLN
5000.0000 [IU] | Freq: Three times a day (TID) | INTRAMUSCULAR | Status: DC
  Administered 2021-06-01 – 2021-06-03 (×6): 5000 [IU] via SUBCUTANEOUS
  Filled 2021-06-01 (×6): qty 1

## 2021-06-01 MED ORDER — PRAVASTATIN SODIUM 40 MG PO TABS
40.0000 mg | ORAL_TABLET | Freq: Every day | ORAL | Status: DC
  Administered 2021-06-02: 40 mg via ORAL
  Filled 2021-06-01: qty 1

## 2021-06-01 MED ORDER — CALCIUM CARBONATE ANTACID 500 MG PO CHEW
1.0000 | CHEWABLE_TABLET | Freq: Two times a day (BID) | ORAL | Status: DC | PRN
  Administered 2021-06-01: 200 mg via ORAL
  Filled 2021-06-01: qty 1

## 2021-06-01 NOTE — Telephone Encounter (Signed)
Pt just wanted to let you know that [facility] has agreed to do her surgery but she will remain at [facility] until a bed is available.

## 2021-06-01 NOTE — ED Notes (Signed)
Pt given meal tray and assisted with ambulation to bathroom without complications. Nurse notified

## 2021-06-01 NOTE — ED Provider Notes (Signed)
Emergency Medicine Observation Re-evaluation Note  Amber Church is a 66 y.o. female, seen on rounds today.  Pt initially presented to the ED for complaints of Abdominal Pain Currently, the patient is awaiting transfer to [facility] for further medical care.  She is resting comfortably, but indicates that she will have episodes of severe pain every 4 hours, requiring IV medicine.  Physical Exam  BP 137/85 (BP Location: Left Arm)   Pulse 86   Temp 98.4 F (36.9 C) (Oral)   Resp 18   Ht '5\' 4"'$  (1.626 m)   Wt 77 kg   SpO2 98%   BMI 29.14 kg/m  Physical Exam General: No acute distress Cardiac: Regular rate Lungs: No respiratory distress  ED Course / MDM  EKG:   I have reviewed the labs performed to date as well as medications administered while in observation.  Recent changes in the last 24 hours include no new changes that are concerning.  White count has improved.  Plan  Current plan is for patient to be transferred.  Shearon S Krikorian is not under involuntary commitment.     Varney Biles, MD 06/01/21 1016

## 2021-06-01 NOTE — Progress Notes (Signed)
Gastroenterology Progress Note   Referring Provider: No ref. provider found Primary Care Physician:  Denita Lung, MD Primary Gastroenterologist:  Dr. Gala Romney (previously unassigned)  Patient ID: Amber Church; [id number]; 10-Dec-1955    Subjective   Patient continues to be able to get up and move around.  She states she is tolerating her clear liquid diet, does admit to some early satiety.  She also has been having frequent belching and some heartburn.  Denies any chest pain or shortness of breath.  She denies any nausea or vomiting or diarrhea.  She states she has not had a bowel movement, passing little flatulence.  Pain continues to be midepigastric left upper quadrant extending to her back.  Does endorse some mild discomfort to her lower abdomen.   Objective   Vital signs in last 24 hours Temp:  [98.1 F (36.7 C)-98.4 F (36.9 C)] 98.1 F (36.7 C) (05/26 1210) Pulse Rate:  [71-86] 84 (05/26 1211) Resp:  [16-18] 18 (05/26 1211) BP: (116-137)/(79-91) 118/91 (05/26 1211) SpO2:  [95 %-99 %] 99 % (05/26 1211)    Physical Exam General:   Alert and oriented, pleasant Head:  Normocephalic and atraumatic. Eyes:  No icterus, sclera clear. Conjuctiva pink.  Heart:  S1, S2 present, no murmurs noted.  Lungs: Clear to auscultation bilaterally, without wheezing, rales, or rhonchi.  Abdomen:  Bowel sounds present, non-distended.  Tenderness to epigastrium and left upper quadrant.  Mild tenderness to lower abdomen.  No HSM or hernias noted. No masses appreciated  Msk:  Symmetrical without gross deformities. Normal posture. Extremities:  Without clubbing or edema. Neurologic:  Alert and  oriented x4;  grossly normal neurologically. Skin:  Warm and dry, intact without significant lesions.  Psych:  Alert and cooperative. Normal mood and affect.  Intake/Output from previous day: 05/25 0701 - 05/26 0700 In: 1198.6 [I.V.:1000; IV Piggyback:198.6] Out: -  Intake/Output this  shift: Total I/O In: 764.6 [I.V.:764.6] Out: -   Lab Results  Recent Labs    05/29/21 1350 05/30/21 1322 06/01/21 0345  WBC 15.2* 16.3* 9.9  HGB 12.1 11.7* 10.4*  HCT 36.1 36.0 32.2*  PLT 592* 577* 464*   BMET Recent Labs    05/29/21 1350 05/30/21 1322 06/01/21 0345  NA 138 136 138  K 4.0 3.4* 3.8  CL 98 101 104  CO2 '24 27 28  '$ GLUCOSE 145* 106* 109*  BUN '8 10 8  '$ CREATININE 0.77 0.75 0.65  CALCIUM 8.7 8.8* 8.4*   LFT Recent Labs    05/29/21 1350 05/30/21 1322 06/01/21 0345  PROT 6.7 7.4 6.0*  ALBUMIN 3.7* 3.3* 2.8*  AST '21 20 18  '$ ALT '15 15 13  '$ ALKPHOS 133* 115 92  BILITOT 0.4 0.5 0.5   PT/INR No results for input(s): LABPROT, INR in the last 72 hours. Hepatitis Panel No results for input(s): HEPBSAG, HCVAB, HEPAIGM, HEPBIGM in the last 72 hours.  Studies/Results CT ABDOMEN PELVIS W CONTRAST  Result Date: 05/30/2021 CLINICAL DATA:  Abdominal pain. Status post cholecystectomy 2 weeks ago. EXAM: CT ABDOMEN AND PELVIS WITH CONTRAST TECHNIQUE: Multidetector CT imaging of the abdomen and pelvis was performed using the standard protocol following bolus administration of intravenous contrast. RADIATION DOSE REDUCTION: This exam was performed according to the departmental dose-optimization program which includes automated exposure control, adjustment of the mA and/or kV according to patient size and/or use of iterative reconstruction technique. CONTRAST:  144m OMNIPAQUE IOHEXOL 300 MG/ML  SOLN COMPARISON:  None Available. FINDINGS: Lower chest: Scar versus  subsegmental atelectasis noted within both lung bases. No pleural effusion. Hepatobiliary: No suspicious liver abnormality. Signs of recent cholecystectomy. Trace fluid noted within the gallbladder fossa. No intrahepatic or common bile duct dilatation. Pancreas: Signs of subacute pancreatitis identified with large, mature bilobed pseudocyst originating from the head through tail of pancreas. The pseudocyst measures 16.1  x 9.1 by 10.7 cm and extends into the lesser sac with marked mass effect upon the posterior aspect of the stomach. Additional smaller satellite pseudocysts are noted at a slightly lower level within the ventral abdomen as noted on image 44/2. Spleen: Normal in size without focal abnormality. Adrenals/Urinary Tract: Normal adrenal glands. No nephrolithiasis, hydronephrosis or mass. Urinary bladder is unremarkable. Stomach/Bowel: The gastric lumen is compressed by large pseudo cyst. The stomach is pressed against the ventral abdominal wall. No significant small or large bowel wall thickening, inflammation, or distension. Sigmoid diverticulosis without signs of acute diverticulitis. Vascular/Lymphatic: Normal appearance of the abdominal aorta. Prominent retroperitoneal lymph nodes likely reflect reactive adenopathy. No signs of abdominopelvic adenopathy. The intrahepatic portal vein is patent. Pseudo cyst exhibits marked mass-effect upon the extrahepatic portal vein and portal venous confluence without signs of occlusion. The splenic vein is not visualized and is presumed to be a chronically occluded with increased upper abdominal collateral vessel formation. Reproductive: Small partially exophytic calcified fibroid noted arising off the uterine fundus. No signs of adnexal mass. Other: Small volume of free fluid noted within the posterior pelvis. Musculoskeletal: No acute or suspicious osseous findings. Degenerative disc disease noted within the lower thoracic and lower sub lumbar spine. IMPRESSION: 1. Signs of subacute pancreatitis with large, mature bilobed pseudocyst with marked mass effect upon the posterior aspect of the stomach, extrahepatic portal vein and portal venous confluence. 2. Signs of splenic vein occlusion identified with increased upper abdominal collateral formation. 3. No signs of bile duct obstruction. 4. Small volume of free fluid within the posterior pelvis. 5. Sigmoid diverticulosis without  signs of acute diverticulitis. Electronically Signed   By: Kerby Moors M.D.   On: 05/30/2021 18:47   DG ABD ACUTE 2+V W 1V CHEST  Result Date: 05/30/2021 CLINICAL DATA:  Abdominal distention, status post cholecystectomy EXAM: DG ABDOMEN ACUTE WITH 1 VIEW CHEST COMPARISON:  None Available. FINDINGS: Transverse diameter of heart is slightly increased. There are no signs of pulmonary edema. Small left pleural effusion is seen. There are small linear densities in the lateral aspect of left lower lung fields. There is no focal pulmonary consolidation. There is no pneumothorax. Bowel gas pattern is nonspecific. There is no dilation of small-bowel loops. Gas and stool are present in colon. Surgical clips are seen in gallbladder fossa. No abnormal masses or calcifications are seen. Degenerative changes are noted in the lumbar spine. IMPRESSION: Bowel gas pattern is nonspecific. Small left pleural effusion. There are no signs of pulmonary edema or focal pulmonary consolidation. Electronically Signed   By: Elmer Picker M.D.   On: 05/30/2021 15:45   US ABDOMEN LIMITED RUQ (LIVER/GB)  Result Date: 05/16/2021 EXAM: ULTRASOUND ABDOMEN LIMITED RIGHT UPPER QUADRANT COMPARISON:  None Available. FINDINGS: Gallbladder: No wall thickening visualized. Multiple gallstone seen, largest measures 1.1 cm. No sonographic Murphy sign noted by sonographer. Common bile duct: Diameter: 0.5 mm No intrahepatic biliary ductal dilation. Liver: No focal lesion identified. Within normal limits in parenchymal echogenicity. Portal vein is patent on color Doppler imaging with normal direction of blood flow towards the liver. Other: None. IMPRESSION: Cholelithiasis with no sonographic evidence of acute cholecystitis. Electronically Signed  By: Yetta Glassman M.D.   On: 05/16/2021 08:09    Assessment  66 y.o. female with a history of anxiety, depression, diverticulosis, GERD, HLD, and HTN who recently underwent laparoscopic  cholecystectomy on 5/12.  Patient presented to the ED with abdominal pain with elevated lipase and CT imaging of pancreatitis with large pseudocyst.  GI consulted for further recommendation.  Acute pancreatitis: Patient had a recent outpatient lab work with elevated lipase and amylase and leukocytosis after seeing her PCP on 5/23.  Lipase on admission elevated to 104.  CT A/P with evidence of acute pancreatitis and large mature bowel bed pseudocyst originating from the head through the tail of the pancreas measuring 16.1 cm with mass effect on the posterior stomach with additional smaller satellite pseudocyst.  She is in need of endoscopic drainage of her pseudocyst that cannot be done here at [facility].  ED physician reached out to [facility] for recommendations and admission, however advanced pancreatic endoscopist unavailable.  ED also discussed her case with multiple other tertiary centers including [place], [place], and [place].  She has been accepted to the wait list at [facility] for admission once a bed becomes available.  Hospitalist received update today that it may be a couple days before patient gets a bed therefore she is being admitted.  LFTs remain within normal limits.  She continues to tolerate clear liquid diet, passing small amounts of flatulence.  Has not had a bowel movement.  Denies nausea or vomiting.  Most complaint today is heartburn and frequent belching.  Pain is fairly well controlled on current regimen.  We will continue with clear liquid diet and supportive measures.  Added Tums twice daily for heartburn symptoms.  Constipation: Likely opioid induced given recent cholecystectomy and need for ongoing narcotics.  Has been taking senna without much relief other than some abdominal cramping.  We will add MiraLAX twice daily.  She has a drop in hemoglobin to 10.4, however this is likely secondary to IV hydration.   Plan / Recommendations  Continue supportive measures Pain management per  hospitalist Continue clear liquid diet as tolerated Tums twice daily as needed MiraLAX twice daily Awaiting bed at [facility] for drainage of large pseudocyst, may be worth reaching back out to [facility] or [place] regarding bed availability    LOS: 1 day    06/01/2021, 1:36 PM   Venetia Night, MSN, FNP-BC, AGACNP-BC [facility]

## 2021-06-01 NOTE — H&P (Signed)
History and Physical  [place] QIW:[id number] DOB: 1955/03/29 DOA: 05/30/2021  PCP: Denita Lung, MD  Patient coming from: Home  Level of care: Med-Surg  I have personally briefly reviewed patient's old medical records in [place]  Chief Complaint: abdominal pain   HPI: Amber Church is a 66 year old female with history of GERD, hyperlipidemia, hypertension, osteoarthritis, diverticulosis, anxiety and depression, recently discharged after having a bout of gallstone pancreatitis and subsequent cholecystectomy reports that she initially had been doing well but started having increasing abdominal pain that never fully resolved.  She reports that she has had poor appetite and low tolerance for eating due to intermittent bouts of abdominal pain.  She saw her PCP recently and had labs done that revealed an elevated lipase.  She reported to her surgeon yesterday that she was having 8 out of 10 epigastric abdominal pain that was not improving with pain medications and noted to have a leukocytosis and was sent to the emergency department.  Her repeated labs showed an elevated white count and LFTs were normal.  CT of the abdomen and pelvis demonstrated signs of subacute pancreatitis with a large mature bilobed pseudocyst with marked mass effect upon the posterior aspect of the stomach, extrahepatic portal vein and portal venous confluence, signs of splenic vein occlusion, and no bile duct obstruction.  GI was called yesterday and Dr. Paulita Fujita at [facility] was contacted and discussed the case with Dr. Rush Landmark who has no availability to do the procedure and recommended transfer to another facility.  [facility] and [facility] unable to accept patient because of lack of bed space.       Call back has been received from [facility].  Case was presented to Dr. Andrey Spearman who states that patient will be placed on their wait list.    Case was discussed the case with Dr. Roxanne Mins who indicated that the gastroenterologist will accept when there is a bed available and that may take time.  I was called to admit patient this morning to [facility] but after discussion with GI service at [facility] they recommended ED to ED transfer as we do not offer the treatment that she needs here at [facility] and would be at risk for a longer delay of care.  Decision was made to do a medical consultation while patient boarding in ED waiting on bed at [facility].  I spoke with ED. He says that after speaking with ED at [facility] she likely won't get a bed until tomorrow.     Review of Systems:  Review of Systems  Constitutional:  Negative for fever.  HENT: Negative.    Cardiovascular: Negative.   Gastrointestinal:  Positive for abdominal pain, heartburn and nausea. Negative for diarrhea and vomiting.  Genitourinary: Negative.   Musculoskeletal: Negative.   Skin: Negative.   Neurological: Negative.   Endo/Heme/Allergies: Negative.   Psychiatric/Behavioral: Negative.    All other systems reviewed and are negative.   Past Medical History:  Diagnosis Date   Abnormal EKG    Abnormal pap    s/p cryotherapy   Anxiety    Arthritis    Depression    Diverticulosis    GERD (gastroesophageal reflux disease)    Hyperlipidemia    Hypertension    S/P colonoscopic polypectomy     Past Surgical History:  Procedure Laterality Date   CHOLECYSTECTOMY N/A 05/18/2021   Procedure: LAPAROSCOPIC CHOLECYSTECTOMY;  Surgeon: Rusty Aus, DO;  Location: [facility];  Service: General;  Laterality: N/A;   COLONOSCOPY  03/18/11; 2000   Dr. Amedeo Plenty; diverticulosis in sigmoid colon   crysurgery  age 37   for abnormal pap   LAMINECTOMY  03/2002   TOTAL KNEE ARTHROPLASTY Right 10/02/2018   TOTAL KNEE ARTHROPLASTY Right 10/02/2018   Procedure: RIGHT TOTAL KNEE ARTHROPLASTY;  Surgeon: Newt Minion, MD;  Location: [place];  Service: Orthopedics;  Laterality: Right;     reports that she has  never smoked. She has never used smokeless tobacco. She reports current alcohol use. She reports that she does not use drugs.  No Known Allergies  Family History  Problem Relation Age of Onset   Alzheimer's disease Mother    Hyperlipidemia Mother    Other Father        died of gunshot wound   Osteoporosis Sister    Osteoporosis Sister    ALS Maternal Aunt    ALS Maternal Uncle    Diabetes Paternal Aunt    Cancer Maternal Grandfather        lung cancer   Cancer Paternal Grandmother        ?type   Heart disease Neg Hx     Prior to Admission medications   Medication Sig Start Date End Date Taking? Authorizing Provider  acetaminophen (TYLENOL) 650 MG CR tablet Take 650 mg by mouth every 8 (eight) hours as needed for pain.   Yes [provider]  diclofenac Sodium (VOLTAREN) 1 % GEL Apply 2 g topically 4 (four) times daily as needed (pain). 05/22/21  Yes Irma Roulhac L, MD  ibuprofen (ADVIL) 800 MG tablet Take 1 tablet (800 mg total) by mouth every 8 (eight) hours as needed for mild pain. 05/22/21  Yes Isis Costanza L, MD  lisinopril-hydrochlorothiazide (ZESTORETIC) 10-12.5 MG tablet Take 1 tablet by mouth daily. 11/22/20  Yes Denita Lung, MD  pravastatin (PRAVACHOL) 40 MG tablet Take 1 tablet (40 mg total) by mouth daily. 11/22/20  Yes Denita Lung, MD  senna-docusate (SENOKOT-S) 8.6-50 MG tablet Take 2 tablets by mouth at bedtime. 05/22/21  Yes Murlean Iba, MD    Physical Exam: Vitals:   06/01/21 0610 06/01/21 0821 06/01/21 1210 06/01/21 1211  BP: 116/81 137/85  (!) 118/91  Pulse: 77 86  84  Resp: '16 18  18  '$ Temp:  98.4 F (36.9 C) 98.1 F (36.7 C)   TempSrc:  Oral Oral   SpO2: 96% 98%  99%  Weight:      Height:        Constitutional: NAD, calm, comfortable Eyes: PERRL, lids and conjunctivae normal ENMT: Mucous membranes are moist. Posterior pharynx clear of any exudate or lesions.Normal dentition.  Neck: normal, supple, no masses, no  thyromegaly Respiratory: clear to auscultation bilaterally, no wheezing, no crackles. Normal respiratory effort. No accessory muscle use.  Cardiovascular: normal s1, s2 sounds, no murmurs / rubs / gallops. No extremity edema. 2+ pedal pulses. No carotid bruits.  Abdomen: diffuse epigastric tenderness, no masses palpated. No hepatosplenomegaly. Bowel sounds positive.  Musculoskeletal: no clubbing / cyanosis. No joint deformity upper and lower extremities. Good ROM, no contractures. Normal muscle tone.  Skin: no rashes, lesions, ulcers. No induration Neurologic: CN 2-12 grossly intact. Sensation intact, DTR normal. Strength 5/5 in all 4.  Psychiatric: Normal judgment and insight. Alert and oriented x 3. Normal mood.   Labs on Admission: I have personally reviewed following labs and imaging studies  CBC: Recent Labs  Lab 05/29/21  1350 05/30/21 1322 06/01/21 0345  WBC 15.2* 16.3* 9.9  NEUTROABS 11.0*  --  7.0  HGB 12.1 11.7* 10.4*  HCT 36.1 36.0 32.2*  MCV 88 90.5 90.7  PLT 592* 577* 314*   Basic Metabolic Panel: Recent Labs  Lab 05/29/21 1350 05/30/21 1322 06/01/21 0345  NA 138 136 138  K 4.0 3.4* 3.8  CL 98 101 104  CO2 '24 27 28  '$ GLUCOSE 145* 106* 109*  BUN '8 10 8  '$ CREATININE 0.77 0.75 0.65  CALCIUM 8.7 8.8* 8.4*  MG  --   --  2.0   GFR: Estimated Creatinine Clearance: 71.3 mL/min (by C-G formula based on SCr of 0.65 mg/dL). Liver Function Tests: Recent Labs  Lab 05/29/21 1350 05/30/21 1322 06/01/21 0345  AST '21 20 18  '$ ALT '15 15 13  '$ ALKPHOS 133* 115 92  BILITOT 0.4 0.5 0.5  PROT 6.7 7.4 6.0*  ALBUMIN 3.7* 3.3* 2.8*   Recent Labs  Lab 05/29/21 1350 05/30/21 1322  LIPASE 97* 104*  AMYLASE 216*  --    No results for input(s): AMMONIA in the last 168 hours. Coagulation Profile: No results for input(s): INR, PROTIME in the last 168 hours. Cardiac Enzymes: No results for input(s): CKTOTAL, CKMB, CKMBINDEX, TROPONINI in the last 168 hours. BNP (last 3  results) No results for input(s): PROBNP in the last 8760 hours. HbA1C: No results for input(s): HGBA1C in the last 72 hours. CBG: No results for input(s): GLUCAP in the last 168 hours. Lipid Profile: No results for input(s): CHOL, HDL, LDLCALC, TRIG, CHOLHDL, LDLDIRECT in the last 72 hours. Thyroid Function Tests: No results for input(s): TSH, T4TOTAL, FREET4, T3FREE, THYROIDAB in the last 72 hours. Anemia Panel: No results for input(s): VITAMINB12, FOLATE, FERRITIN, TIBC, IRON, RETICCTPCT in the last 72 hours. Urine analysis:    Component Value Date/Time   COLORURINE YELLOW 05/30/2021 1311   APPEARANCEUR CLEAR 05/30/2021 1311   LABSPEC 1.018 05/30/2021 1311   LABSPEC 1.030 06/03/2017 1359   PHURINE 6.0 05/30/2021 1311   GLUCOSEU NEGATIVE 05/30/2021 1311   HGBUR SMALL (A) 05/30/2021 1311   BILIRUBINUR NEGATIVE 05/30/2021 1311   BILIRUBINUR negative 06/03/2017 1359   BILIRUBINUR neg 03/20/2017 1340   KETONESUR NEGATIVE 05/30/2021 1311   PROTEINUR NEGATIVE 05/30/2021 1311   UROBILINOGEN negative (A) 03/20/2017 1340   NITRITE NEGATIVE 05/30/2021 1311   LEUKOCYTESUR NEGATIVE 05/30/2021 1311    Radiological Exams on Admission: CT ABDOMEN PELVIS W CONTRAST  Result Date: 05/30/2021 CLINICAL DATA:  Abdominal pain. Status post cholecystectomy 2 weeks ago. EXAM: CT ABDOMEN AND PELVIS WITH CONTRAST TECHNIQUE: Multidetector CT imaging of the abdomen and pelvis was performed using the standard protocol following bolus administration of intravenous contrast. RADIATION DOSE REDUCTION: This exam was performed according to the departmental dose-optimization program which includes automated exposure control, adjustment of the mA and/or kV according to patient size and/or use of iterative reconstruction technique. CONTRAST:  156m OMNIPAQUE IOHEXOL 300 MG/ML  SOLN COMPARISON:  None Available. FINDINGS: Lower chest: Scar versus subsegmental atelectasis noted within both lung bases. No pleural  effusion. Hepatobiliary: No suspicious liver abnormality. Signs of recent cholecystectomy. Trace fluid noted within the gallbladder fossa. No intrahepatic or common bile duct dilatation. Pancreas: Signs of subacute pancreatitis identified with large, mature bilobed pseudocyst originating from the head through tail of pancreas. The pseudocyst measures 16.1 x 9.1 by 10.7 cm and extends into the lesser sac with marked mass effect upon the posterior aspect of the stomach. Additional smaller satellite pseudocysts are  noted at a slightly lower level within the ventral abdomen as noted on image 44/2. Spleen: Normal in size without focal abnormality. Adrenals/Urinary Tract: Normal adrenal glands. No nephrolithiasis, hydronephrosis or mass. Urinary bladder is unremarkable. Stomach/Bowel: The gastric lumen is compressed by large pseudo cyst. The stomach is pressed against the ventral abdominal wall. No significant small or large bowel wall thickening, inflammation, or distension. Sigmoid diverticulosis without signs of acute diverticulitis. Vascular/Lymphatic: Normal appearance of the abdominal aorta. Prominent retroperitoneal lymph nodes likely reflect reactive adenopathy. No signs of abdominopelvic adenopathy. The intrahepatic portal vein is patent. Pseudo cyst exhibits marked mass-effect upon the extrahepatic portal vein and portal venous confluence without signs of occlusion. The splenic vein is not visualized and is presumed to be a chronically occluded with increased upper abdominal collateral vessel formation. Reproductive: Small partially exophytic calcified fibroid noted arising off the uterine fundus. No signs of adnexal mass. Other: Small volume of free fluid noted within the posterior pelvis. Musculoskeletal: No acute or suspicious osseous findings. Degenerative disc disease noted within the lower thoracic and lower sub lumbar spine. IMPRESSION: 1. Signs of subacute pancreatitis with large, mature bilobed  pseudocyst with marked mass effect upon the posterior aspect of the stomach, extrahepatic portal vein and portal venous confluence. 2. Signs of splenic vein occlusion identified with increased upper abdominal collateral formation. 3. No signs of bile duct obstruction. 4. Small volume of free fluid within the posterior pelvis. 5. Sigmoid diverticulosis without signs of acute diverticulitis. Electronically Signed   By: Kerby Moors M.D.   On: 05/30/2021 18:47   DG ABD ACUTE 2+V W 1V CHEST  Result Date: 05/30/2021 CLINICAL DATA:  Abdominal distention, status post cholecystectomy EXAM: DG ABDOMEN ACUTE WITH 1 VIEW CHEST COMPARISON:  None Available. FINDINGS: Transverse diameter of heart is slightly increased. There are no signs of pulmonary edema. Small left pleural effusion is seen. There are small linear densities in the lateral aspect of left lower lung fields. There is no focal pulmonary consolidation. There is no pneumothorax. Bowel gas pattern is nonspecific. There is no dilation of small-bowel loops. Gas and stool are present in colon. Surgical clips are seen in gallbladder fossa. No abnormal masses or calcifications are seen. Degenerative changes are noted in the lumbar spine. IMPRESSION: Bowel gas pattern is nonspecific. Small left pleural effusion. There are no signs of pulmonary edema or focal pulmonary consolidation. Electronically Signed   By: Elmer Picker M.D.   On: 05/30/2021 15:45     Assessment/Plan Principal Problem:   Acute pancreatitis Active Problems:   Colicky RUQ abdominal pain   Hypertension   Hyperlipidemia LDL goal <100   Leukocytosis   Pancreatic pseudocyst   Thrombocytosis   Normocytic anemia   Hypoalbuminemia   Assessment and Plan: * Acute pancreatitis -- continue pain management and supportive measures while awaiting transfer to [facility] for treatment  -- clear liquid diet only for now as that is all she can tolerate at this time  Colicky RUQ abdominal pain --  secondary to acute pancreatitis with pseudocyst -- pt waiting transfer to [facility] for endoscopic lysis of cyst -- continue pain management as ordered and supportive measures while waiting for bed at [facility]  Pancreatic pseudocyst -- Pt accepted by GI to [facility] for endoscopic treatment  -- update from [facility]: no bed expected in next couple of days.  I spoke with administration at [facility] and ok to admit to bed on unit and get out of ED.    Leukocytosis -- secondary to pancreatic  pseudocyst  -- awaiting transfer to [facility] for treatment   Hyperlipidemia LDL goal <100 -- resumed home pravastatin  Hypertension -- home BP meds reordered   DVT prophylaxis: SCDs   Code Status: Full   Family Communication:   Disposition Plan: Transfer to [facility] when bed ready   Consults called: GI   Admission status: INP  Level of care: Med-Surg Irwin Brakeman MD [facility] How to contact the [facility] Attending or Consulting provider [place] or covering provider during after hours [place], for this patient?  Check the care team in [facility] and look for a) attending/consulting TRH provider listed and b) the [facility] team listed Log into [email or web address] and use [place]'s universal password to access. If you do not have the password, please contact the hospital operator. Locate the [facility] provider you are looking for under [facility] and page to a number that you can be directly reached. If you still have difficulty reaching the provider, please page the [facility] (Director on Call) for the Hospitalists listed on amion for assistance.   If 7PM-7AM, please contact night-coverage [email or web address] Password [email or web address]  06/01/2021, 12:50 PM

## 2021-06-01 NOTE — Progress Notes (Signed)
  Transition of Care [facility]) Screening Note   Patient Details  Name: Amber Church Date of Birth: 10-Aug-1955   Transition of [facility]) CM/SW Contact:    Iona Beard, [place] Phone Number: 06/01/2021, 11:36 AM    Transition of Care Department [facility]) has reviewed patient and no TOC needs have been identified at this time. We will continue to monitor patient advancement through interdisciplinary progression rounds. If new patient transition needs arise, please place a TOC consult.

## 2021-06-02 DIAGNOSIS — R1011 Right upper quadrant pain: Secondary | ICD-10-CM | POA: Diagnosis not present

## 2021-06-02 DIAGNOSIS — E785 Hyperlipidemia, unspecified: Secondary | ICD-10-CM | POA: Diagnosis not present

## 2021-06-02 DIAGNOSIS — K863 Pseudocyst of pancreas: Secondary | ICD-10-CM | POA: Diagnosis not present

## 2021-06-02 DIAGNOSIS — K8500 Idiopathic acute pancreatitis without necrosis or infection: Secondary | ICD-10-CM | POA: Diagnosis not present

## 2021-06-02 LAB — LIPASE, BLOOD: Lipase: 56 U/L — ABNORMAL HIGH (ref 11–51)

## 2021-06-02 NOTE — Assessment & Plan Note (Signed)
--   Hg trending down slowly with hydration secondary to hemodilution

## 2021-06-02 NOTE — Assessment & Plan Note (Signed)
--   secondary to poor oral intake recently due to ongoing problem with pancreatitis and gallstones

## 2021-06-02 NOTE — Assessment & Plan Note (Signed)
--   suspect reactive, following.

## 2021-06-02 NOTE — Progress Notes (Signed)
[facility] transfer center called for an update on patient. Recent vitals, labs, and pt status given and transfer center will call back when a bed becomes available. AC notified.  Deirdre Pippins, RN

## 2021-06-02 NOTE — Progress Notes (Signed)
PROGRESS NOTE   Amber Church  YPP:[id number] DOB: September 28, 1955 DOA: 05/30/2021 PCP: Denita Lung, MD   Chief Complaint  Patient presents with   Abdominal Pain   Level of care: Med-Surg  Brief Admission History:  66 year old female with history of GERD, hyperlipidemia, hypertension, osteoarthritis, diverticulosis, anxiety and depression, recently discharged after having a bout of gallstone pancreatitis and subsequent cholecystectomy reports that she initially had been doing well but started having increasing abdominal pain that never fully resolved.  She reports that she has had poor appetite and low tolerance for eating due to intermittent bouts of abdominal pain.  She saw her PCP recently and had labs done that revealed an elevated lipase.  She reported to her surgeon yesterday that she was having 8 out of 10 epigastric abdominal pain that was not improving with pain medications and noted to have a leukocytosis and was sent to the emergency department.  Her repeated labs showed an elevated white count and LFTs were normal.  CT of the abdomen and pelvis demonstrated signs of subacute pancreatitis with a large mature bilobed pseudocyst with marked mass effect upon the posterior aspect of the stomach, extrahepatic portal vein and portal venous confluence, signs of splenic vein occlusion, and no bile duct obstruction.  GI was called yesterday and Dr. Paulita Fujita at [facility] was contacted and discussed the case with Dr. Rush Landmark who has no availability to do the procedure and recommended transfer to another facility.  [facility] and [facility] unable to accept patient because of lack of bed space.       Call back has been received from [facility].  Case was presented to Dr. Andrey Spearman who states that patient will be placed on their wait list.   Case was discussed the case with Dr. Roxanne Mins who indicated that the gastroenterologist will accept when there is a  bed available and that may take time.  I was called to admit patient this morning to [facility] but after discussion with GI service at [facility] they recommended ED to ED transfer as we do not offer the treatment that she needs here at [facility] and would be at risk for a longer delay of care.  Decision was made to do a medical consultation while patient boarding in ED waiting on bed at [facility].  I spoke with ED. He says that after speaking with ED at [facility] she likely won't get a bed until later in the week.      Assessment and Plan: * Acute pancreatitis -- continue pain management and supportive measures while awaiting transfer to [facility] for treatment  -- clear liquid diet only for now as that is all she can tolerate at this time.    Colicky RUQ abdominal pain -- secondary to acute pancreatitis with pseudocyst -- pt waiting transfer to [facility] for endoscopic lysis of cyst -- continue pain management as ordered and supportive measures while waiting for bed at [facility]  Hypoalbuminemia -- secondary to poor oral intake recently due to ongoing problem with pancreatitis and gallstones  Normocytic anemia -- Hg trending down slowly with hydration secondary to hemodilution  Thrombocytosis -- suspect reactive, following.   Pancreatic pseudocyst -- Pt accepted by GI to [facility] for endoscopic treatment of pseudocyst.  -- update from [facility]: no bed expected until Sunday.  I spoke with administration at [facility] and ok to admit to bed on unit and get out of ED.    Leukocytosis -- secondary to pancreatic pseudocyst  --  awaiting transfer to [facility] for treatment   Hyperlipidemia LDL goal <100 -- resumed home pravastatin  Hypertension -- home BP meds reordered   DVT prophylaxis: SQ heparin  Code Status: full  Family Communication:  Disposition: Status is: Inpatient Remains inpatient appropriate because: IV fluid, IV pain management    Consultants:  GI  Procedures:   Antimicrobials:    Subjective: Pt reports abdominal pain persists  but more manageable, still requiring IV pain meds. Pt had bowel movement overnight  Vitals:   06/01/21 1442 06/01/21 1513 06/01/21 2008 06/02/21 0505  BP: 120/83 (!) 128/93 125/82 118/70  Pulse: 97 97 72 76  Resp: '16 18 16 16  '$ Temp: 98.5 F (36.9 C) 98.4 F (36.9 C) 98.6 F (37 C) 97.9 F (36.6 C)  TempSrc: Oral     SpO2:  100% 99% 97%  Weight:      Height:        Intake/Output Summary (Last 24 hours) at 06/02/2021 1003 Last data filed at 06/01/2021 1700 Gross per 24 hour  Intake 1244.64 ml  Output --  Net 1244.64 ml   Filed Weights   05/30/21 1305  Weight: 77 kg   Examination:  General exam: Appears calm and comfortable  Respiratory system: Clear to auscultation. Respiratory effort normal. Cardiovascular system: normal S1 & S2 heard. No JVD, murmurs, rubs, gallops or clicks. No pedal edema. Gastrointestinal system: Abdomen is nondistended, soft and persistent epigastric tenderness. No organomegaly or masses felt. Normal bowel sounds heard. Central nervous system: Alert and oriented. No focal neurological deficits. Extremities: Symmetric 5 x 5 power. Skin: No rashes, lesions or ulcers. Psychiatry: Judgement and insight appear normal. Mood & affect appropriate.   Data Reviewed: I have personally reviewed following labs and imaging studies  CBC: Recent Labs  Lab 05/29/21 1350 05/30/21 1322 06/01/21 0345  WBC 15.2* 16.3* 9.9  NEUTROABS 11.0*  --  7.0  HGB 12.1 11.7* 10.4*  HCT 36.1 36.0 32.2*  MCV 88 90.5 90.7  PLT 592* 577* 464*    Basic Metabolic Panel: Recent Labs  Lab 05/29/21 1350 05/30/21 1322 06/01/21 0345  NA 138 136 138  K 4.0 3.4* 3.8  CL 98 101 104  CO2 '24 27 28  '$ GLUCOSE 145* 106* 109*  BUN '8 10 8  '$ CREATININE 0.77 0.75 0.65  CALCIUM 8.7 8.8* 8.4*  MG  --   --  2.0    CBG: No results for input(s): GLUCAP in the last 168 hours.  No results found for this or any previous visit (from the past 240 hour(s)).   Radiology Studies: No  results found.  Scheduled Meds:  heparin  5,000 Units Subcutaneous Q8H   hydrochlorothiazide  12.5 mg Oral Daily   lisinopril  10 mg Oral Daily   polyethylene glycol  17 g Oral BID   pravastatin  40 mg Oral q1800   senna-docusate  2 tablet Oral QHS   Continuous Infusions:  lactated ringers 75 mL/hr at 06/01/21 1611     LOS: 2 days   Time spent: 35 mins  Adria Costley Wynetta Emery, MD How to contact the [facility] Attending or Consulting provider [place] or covering provider during after hours [place], for this patient?  Check the care team in [facility] and look for a) attending/consulting TRH provider listed and b) the [facility] team listed Log into [email or web address] and use [place]'s universal password to access. If you do not have the password, please contact the hospital operator. Locate the [facility] provider you are  looking for under [facility] and page to a number that you can be directly reached. If you still have difficulty reaching the provider, please page the [facility] (Director on Call) for the Hospitalists listed on amion for assistance.  06/02/2021, 10:03 AM

## 2021-06-03 DIAGNOSIS — K8500 Idiopathic acute pancreatitis without necrosis or infection: Secondary | ICD-10-CM | POA: Diagnosis not present

## 2021-06-03 DIAGNOSIS — I10 Essential (primary) hypertension: Secondary | ICD-10-CM | POA: Diagnosis not present

## 2021-06-03 DIAGNOSIS — E785 Hyperlipidemia, unspecified: Secondary | ICD-10-CM | POA: Diagnosis not present

## 2021-06-03 DIAGNOSIS — I96 Gangrene, not elsewhere classified: Secondary | ICD-10-CM | POA: Diagnosis not present

## 2021-06-03 DIAGNOSIS — M1990 Unspecified osteoarthritis, unspecified site: Secondary | ICD-10-CM | POA: Diagnosis not present

## 2021-06-03 DIAGNOSIS — K449 Diaphragmatic hernia without obstruction or gangrene: Secondary | ICD-10-CM | POA: Diagnosis not present

## 2021-06-03 DIAGNOSIS — K859 Acute pancreatitis without necrosis or infection, unspecified: Secondary | ICD-10-CM | POA: Diagnosis not present

## 2021-06-03 DIAGNOSIS — G8929 Other chronic pain: Secondary | ICD-10-CM | POA: Diagnosis not present

## 2021-06-03 DIAGNOSIS — K863 Pseudocyst of pancreas: Secondary | ICD-10-CM | POA: Diagnosis not present

## 2021-06-03 DIAGNOSIS — E43 Unspecified severe protein-calorie malnutrition: Secondary | ICD-10-CM | POA: Diagnosis not present

## 2021-06-03 DIAGNOSIS — K862 Cyst of pancreas: Secondary | ICD-10-CM | POA: Diagnosis not present

## 2021-06-03 DIAGNOSIS — K222 Esophageal obstruction: Secondary | ICD-10-CM | POA: Diagnosis not present

## 2021-06-03 DIAGNOSIS — K219 Gastro-esophageal reflux disease without esophagitis: Secondary | ICD-10-CM | POA: Diagnosis not present

## 2021-06-03 DIAGNOSIS — K3189 Other diseases of stomach and duodenum: Secondary | ICD-10-CM | POA: Diagnosis not present

## 2021-06-03 MED ORDER — PANTOPRAZOLE SODIUM 40 MG PO TBEC
40.0000 mg | DELAYED_RELEASE_TABLET | Freq: Every day | ORAL | Status: DC
Start: 2021-06-03 — End: 2021-06-03

## 2021-06-03 MED ORDER — MORPHINE SULFATE (PF) 2 MG/ML IV SOLN: 2.0000 mg | INTRAVENOUS | Status: DC | PRN

## 2021-06-03 NOTE — Progress Notes (Signed)
06/03/2021 9:43 AM  Pt was transferred to [facility] this morning without my knowledge and without a discharge order.  I wasn't called or notified that patient had received a bed at [facility].  When I went in the room this morning to examine patient she was already gone.  I'm now entering the discharge order after the fact and completing a discharge summary now.   Murvin Natal, MD

## 2021-06-03 NOTE — Discharge Summary (Signed)
Physician Discharge Summary  Amber Church:[id number] DOB: July 01, 1955 DOA: 05/30/2021  PCP: Denita Lung, MD  Admit date: 05/30/2021 Discharge date: 06/03/2021  Disposition:  TRANSFERRING TO [place] LEVEL OF CARE   Discharge Condition: STABLE   CODE STATUS: FULL  DIET: CLEAR LIQUIDS    Brief Hospitalization Summary: Please see all hospital notes, images, labs for full details of the hospitalization. 66 year old female with history of GERD, hyperlipidemia, hypertension, osteoarthritis, diverticulosis, anxiety and depression, recently discharged after having a bout of gallstone pancreatitis and subsequent cholecystectomy reports that she initially had been doing well but started having increasing abdominal pain that never fully resolved.  She reports that she has had poor appetite and low tolerance for eating due to intermittent bouts of abdominal pain.  She saw her PCP recently and had labs done that revealed an elevated lipase.  She reported to her surgeon yesterday that she was having 8 out of 10 epigastric abdominal pain that was not improving with pain medications and noted to have a leukocytosis and was sent to the emergency department.  Her repeated labs showed an elevated white count and LFTs were normal.  CT of the abdomen and pelvis demonstrated signs of subacute pancreatitis with a large mature bilobed pseudocyst with marked mass effect upon the posterior aspect of the stomach, extrahepatic portal vein and portal venous confluence, signs of splenic vein occlusion, and no bile duct obstruction.  GI was called yesterday and Dr. Paulita Fujita at [facility] was contacted and discussed the case with Dr. Rush Landmark who has no availability to do the procedure and recommended transfer to another facility.  [facility] unable to accept patient because of lack of bed space.       Call back has been received from [facility].  Case  was presented to Dr. Andrey Spearman who states that patient will be placed on their wait list.   Case was discussed the case with Dr. Roxanne Mins who indicated that the gastroenterologist will accept when there is a bed available and that may take time.  I was called to admit patient this morning to [facility] but after discussion with GI service at [facility] they recommended ED to ED transfer as we do not offer the treatment that she needs here at [facility] and would be at risk for a longer delay of care.  Decision was made to do a medical consultation while patient boarding in ED waiting on bed at [facility].  I spoke with ED. He says that after speaking with ED at [facility] she likely won't get a bed until later in the week.     Assessment and Plan: * Acute pancreatitis -- continue pain management and supportive measures while awaiting transfer to [facility] for treatment  -- clear liquid diet only for now as that is all she can tolerate at this time.    Colicky RUQ abdominal pain -- secondary to acute pancreatitis with pseudocyst -- pt waiting transfer to [facility] for endoscopic lysis of cyst -- continue pain management as ordered and supportive measures while waiting for bed at [facility]  Hypoalbuminemia -- secondary to poor oral intake recently due to ongoing problem with pancreatitis and gallstones  Normocytic anemia -- Hg trending down slowly with hydration secondary to hemodilution  Thrombocytosis -- suspect reactive, following.   Pancreatic pseudocyst -- Pt accepted by GI to [facility] for endoscopic treatment of pseudocyst.  -- update from [facility]: no bed expected until Sunday.  I spoke with administration at [facility] and ok to admit to bed on unit and get out of ED.    Leukocytosis -- secondary to pancreatic pseudocyst  -- awaiting transfer to [facility] for treatment   Hyperlipidemia LDL goal <100 -- resumed home pravastatin  Hypertension -- home BP meds reordered   Discharge Diagnoses:  Principal Problem:   Acute pancreatitis Active  Problems:   Colicky RUQ abdominal pain   Hypertension   Hyperlipidemia LDL goal <100   Leukocytosis   Pancreatic pseudocyst   Thrombocytosis   Normocytic anemia   Hypoalbuminemia   Discharge Instructions:  Allergies as of 06/03/2021   No Known Allergies      Medication List     STOP taking these medications    acetaminophen 650 MG CR tablet Commonly known as: TYLENOL   diclofenac Sodium 1 % Gel Commonly known as: Voltaren   ibuprofen 800 MG tablet Commonly known as: ADVIL   lisinopril-hydrochlorothiazide 10-12.5 MG tablet Commonly known as: ZESTORETIC   oxyCODONE 5 MG immediate release tablet Commonly known as: Roxicodone   pravastatin 40 MG tablet Commonly known as: PRAVACHOL   senna-docusate 8.6-50 MG tablet Commonly known as: Senokot-S        No Known Allergies Allergies as of 06/03/2021   No Known Allergies      Medication List     STOP taking these medications    acetaminophen 650 MG CR tablet Commonly known as: TYLENOL   diclofenac Sodium 1 % Gel Commonly known as: Voltaren   ibuprofen 800 MG tablet Commonly known as: ADVIL   lisinopril-hydrochlorothiazide 10-12.5 MG tablet Commonly known as: ZESTORETIC   oxyCODONE 5 MG immediate release tablet Commonly known as: Roxicodone   pravastatin 40 MG tablet Commonly known as: PRAVACHOL   senna-docusate 8.6-50 MG tablet Commonly known as: Senokot-S        Procedures/Studies: CT ABDOMEN PELVIS W CONTRAST  Result Date: 05/30/2021 CLINICAL DATA:  Abdominal pain. Status post cholecystectomy 2 weeks ago. EXAM: CT ABDOMEN AND PELVIS WITH CONTRAST TECHNIQUE: Multidetector CT imaging of the abdomen and pelvis was performed using the standard protocol following bolus administration of intravenous contrast. RADIATION DOSE REDUCTION: This exam was performed according to the departmental dose-optimization program which includes automated exposure control, adjustment of the mA and/or kV according  to patient size and/or use of iterative reconstruction technique. CONTRAST:  170m OMNIPAQUE IOHEXOL 300 MG/ML  SOLN COMPARISON:  None Available. FINDINGS: Lower chest: Scar versus subsegmental atelectasis noted within both lung bases. No pleural effusion. Hepatobiliary: No suspicious liver abnormality. Signs of recent cholecystectomy. Trace fluid noted within the gallbladder fossa. No intrahepatic or common bile duct dilatation. Pancreas: Signs of subacute pancreatitis identified with large, mature bilobed pseudocyst originating from the head through tail of pancreas. The pseudocyst measures 16.1 x 9.1 by 10.7 cm and extends into the lesser sac with marked mass effect upon the posterior aspect of the stomach. Additional smaller satellite pseudocysts are noted at a slightly lower level within the ventral abdomen as noted on image 44/2. Spleen: Normal in size without focal abnormality. Adrenals/Urinary Tract: Normal adrenal glands. No nephrolithiasis, hydronephrosis or mass. Urinary bladder is unremarkable. Stomach/Bowel: The gastric lumen is compressed by large pseudo cyst. The stomach is pressed against the ventral abdominal wall. No significant small or large bowel wall thickening, inflammation, or distension. Sigmoid diverticulosis without signs of acute diverticulitis. Vascular/Lymphatic: Normal appearance of the abdominal aorta. Prominent retroperitoneal lymph nodes likely reflect reactive adenopathy. No signs of abdominopelvic adenopathy. The intrahepatic portal  vein is patent. Pseudo cyst exhibits marked mass-effect upon the extrahepatic portal vein and portal venous confluence without signs of occlusion. The splenic vein is not visualized and is presumed to be a chronically occluded with increased upper abdominal collateral vessel formation. Reproductive: Small partially exophytic calcified fibroid noted arising off the uterine fundus. No signs of adnexal mass. Other: Small volume of free fluid noted within  the posterior pelvis. Musculoskeletal: No acute or suspicious osseous findings. Degenerative disc disease noted within the lower thoracic and lower sub lumbar spine. IMPRESSION: 1. Signs of subacute pancreatitis with large, mature bilobed pseudocyst with marked mass effect upon the posterior aspect of the stomach, extrahepatic portal vein and portal venous confluence. 2. Signs of splenic vein occlusion identified with increased upper abdominal collateral formation. 3. No signs of bile duct obstruction. 4. Small volume of free fluid within the posterior pelvis. 5. Sigmoid diverticulosis without signs of acute diverticulitis. Electronically Signed   By: Kerby Moors M.D.   On: 05/30/2021 18:47   DG ABD ACUTE 2+V W 1V CHEST  Result Date: 05/30/2021 CLINICAL DATA:  Abdominal distention, status post cholecystectomy EXAM: DG ABDOMEN ACUTE WITH 1 VIEW CHEST COMPARISON:  None Available. FINDINGS: Transverse diameter of heart is slightly increased. There are no signs of pulmonary edema. Small left pleural effusion is seen. There are small linear densities in the lateral aspect of left lower lung fields. There is no focal pulmonary consolidation. There is no pneumothorax. Bowel gas pattern is nonspecific. There is no dilation of small-bowel loops. Gas and stool are present in colon. Surgical clips are seen in gallbladder fossa. No abnormal masses or calcifications are seen. Degenerative changes are noted in the lumbar spine. IMPRESSION: Bowel gas pattern is nonspecific. Small left pleural effusion. There are no signs of pulmonary edema or focal pulmonary consolidation. Electronically Signed   By: Elmer Picker M.D.   On: 05/30/2021 15:45   US ABDOMEN LIMITED RUQ (LIVER/GB)  Result Date: 05/16/2021 EXAM: ULTRASOUND ABDOMEN LIMITED RIGHT UPPER QUADRANT COMPARISON:  None Available. FINDINGS: Gallbladder: No wall thickening visualized. Multiple gallstone seen, largest measures 1.1 cm. No sonographic Murphy sign  noted by sonographer. Common bile duct: Diameter: 0.5 mm No intrahepatic biliary ductal dilation. Liver: No focal lesion identified. Within normal limits in parenchymal echogenicity. Portal vein is patent on color Doppler imaging with normal direction of blood flow towards the liver. Other: None. IMPRESSION: Cholelithiasis with no sonographic evidence of acute cholecystitis. Electronically Signed   By: Yetta Glassman M.D.   On: 05/16/2021 08:09      Discharge Exam: Vitals:   06/03/21 0510 06/03/21 0802  BP: 105/75 126/84  Pulse: (!) 101 94  Resp: 17 18  Temp: (!) 97.4 F (36.3 C) 98.2 F (36.8 C)  SpO2: 100% 100%   Vitals:   06/02/21 1346 06/02/21 2121 06/03/21 0510 06/03/21 0802  BP: (!) 116/92 117/83 105/75 126/84  Pulse: 75 70 (!) 101 94  Resp: '20 17 17 18  '$ Temp: 97.9 F (36.6 C) 98 F (36.7 C) (!) 97.4 F (36.3 C) 98.2 F (36.8 C)  TempSrc: Oral Oral Oral Oral  SpO2: 99% 98% 100% 100%  Weight:      Height:         The results of significant diagnostics from this hospitalization (including imaging, microbiology, ancillary and laboratory) are listed below for reference.     Microbiology: No results found for this or any previous visit (from the past 240 hour(s)).   Labs: BNP (last 3 results)  No results for input(s): BNP in the last 8760 hours. Basic Metabolic Panel: Recent Labs  Lab 05/29/21 1350 05/30/21 1322 06/01/21 0345  NA 138 136 138  K 4.0 3.4* 3.8  CL 98 101 104  CO2 '24 27 28  '$ GLUCOSE 145* 106* 109*  BUN '8 10 8  '$ CREATININE 0.77 0.75 0.65  CALCIUM 8.7 8.8* 8.4*  MG  --   --  2.0   Liver Function Tests: Recent Labs  Lab 05/29/21 1350 05/30/21 1322 06/01/21 0345  AST '21 20 18  '$ ALT '15 15 13  '$ ALKPHOS 133* 115 92  BILITOT 0.4 0.5 0.5  PROT 6.7 7.4 6.0*  ALBUMIN 3.7* 3.3* 2.8*   Recent Labs  Lab 05/29/21 1350 05/30/21 1322 06/02/21 0509  LIPASE 97* 104* 56*  AMYLASE 216*  --   --    No results for input(s): AMMONIA in the last 168  hours. CBC: Recent Labs  Lab 05/29/21 1350 05/30/21 1322 06/01/21 0345  WBC 15.2* 16.3* 9.9  NEUTROABS 11.0*  --  7.0  HGB 12.1 11.7* 10.4*  HCT 36.1 36.0 32.2*  MCV 88 90.5 90.7  PLT 592* 577* 464*   Cardiac Enzymes: No results for input(s): CKTOTAL, CKMB, CKMBINDEX, TROPONINI in the last 168 hours. BNP: Invalid input(s): POCBNP CBG: No results for input(s): GLUCAP in the last 168 hours. D-Dimer No results for input(s): DDIMER in the last 72 hours. Hgb A1c No results for input(s): HGBA1C in the last 72 hours. Lipid Profile No results for input(s): CHOL, HDL, LDLCALC, TRIG, CHOLHDL, LDLDIRECT in the last 72 hours. Thyroid function studies No results for input(s): TSH, T4TOTAL, T3FREE, THYROIDAB in the last 72 hours.  Invalid input(s): FREET3 Anemia work up No results for input(s): VITAMINB12, FOLATE, FERRITIN, TIBC, IRON, RETICCTPCT in the last 72 hours. Urinalysis    Component Value Date/Time   COLORURINE YELLOW 05/30/2021 1311   APPEARANCEUR CLEAR 05/30/2021 1311   LABSPEC 1.018 05/30/2021 1311   LABSPEC 1.030 06/03/2017 1359   PHURINE 6.0 05/30/2021 1311   GLUCOSEU NEGATIVE 05/30/2021 1311   HGBUR SMALL (A) 05/30/2021 1311   BILIRUBINUR NEGATIVE 05/30/2021 1311   BILIRUBINUR negative 06/03/2017 1359   BILIRUBINUR neg 03/20/2017 1340   KETONESUR NEGATIVE 05/30/2021 1311   PROTEINUR NEGATIVE 05/30/2021 1311   UROBILINOGEN negative (A) 03/20/2017 1340   NITRITE NEGATIVE 05/30/2021 1311   LEUKOCYTESUR NEGATIVE 05/30/2021 1311   Sepsis Labs Invalid input(s): PROCALCITONIN,  WBC,  LACTICIDVEN Microbiology No results found for this or any previous visit (from the past 240 hour(s)).  Time coordinating discharge:  35 mins   SIGNED:  Irwin Brakeman, MD  [facility] 06/03/2021, 9:49 AM How to contact the [facility] Attending or Consulting provider [place] or covering provider during after hours [place], for this patient?  Check the care team in [facility] and look for  a) attending/consulting [facility] provider listed and b) the [facility] team listed Log into [email or web address] and use [place]'s universal password to access. If you do not have the password, please contact the hospital operator. Locate the [facility] provider you are looking for under [facility] and page to a number that you can be directly reached. If you still have difficulty reaching the provider, please page the [facility] (Director on Call) for the Hospitalists listed on amion for assistance.

## 2021-06-03 NOTE — Progress Notes (Addendum)
This nurse gave report to [facility] at [facility], informed Care Link is on the way.

## 2021-06-05 DIAGNOSIS — K863 Pseudocyst of pancreas: Secondary | ICD-10-CM | POA: Diagnosis not present

## 2021-06-06 DIAGNOSIS — K863 Pseudocyst of pancreas: Secondary | ICD-10-CM | POA: Diagnosis not present

## 2021-06-07 DIAGNOSIS — K863 Pseudocyst of pancreas: Secondary | ICD-10-CM | POA: Diagnosis not present

## 2021-06-08 DIAGNOSIS — K863 Pseudocyst of pancreas: Secondary | ICD-10-CM | POA: Diagnosis not present

## 2021-06-11 DIAGNOSIS — K863 Pseudocyst of pancreas: Secondary | ICD-10-CM | POA: Diagnosis not present

## 2021-06-11 DIAGNOSIS — I10 Essential (primary) hypertension: Secondary | ICD-10-CM | POA: Diagnosis not present

## 2021-06-11 DIAGNOSIS — K449 Diaphragmatic hernia without obstruction or gangrene: Secondary | ICD-10-CM | POA: Diagnosis not present

## 2021-06-11 DIAGNOSIS — E785 Hyperlipidemia, unspecified: Secondary | ICD-10-CM | POA: Diagnosis not present

## 2021-06-11 DIAGNOSIS — K222 Esophageal obstruction: Secondary | ICD-10-CM | POA: Diagnosis not present

## 2021-06-11 DIAGNOSIS — K8689 Other specified diseases of pancreas: Secondary | ICD-10-CM | POA: Diagnosis not present

## 2021-06-11 DIAGNOSIS — T182XXA Foreign body in stomach, initial encounter: Secondary | ICD-10-CM | POA: Diagnosis not present

## 2021-06-12 ENCOUNTER — Telehealth: Payer: Self-pay

## 2021-06-12 NOTE — Telephone Encounter (Signed)
Transition Care Management Follow-up Telephone Call Date of discharge and from where: [facility] 06/11/21 How have you been since you were released from the hospital? [place] Any questions or concerns? No  Items Reviewed: Did the pt receive and understand the discharge instructions provided? Yes  Medications obtained and verified? Yes  Other? No  Any new allergies since your discharge? No  Dietary orders reviewed? Yes Do you have support at home? Yes   Home Care and Equipment/Supplies: Were home health services ordered? no  Functional Questionnaire: (I = Independent and D = Dependent) ADLs: I  Bathing/Dressing- I  Meal Prep- I  Eating- I  Maintaining continence- I  Transferring/Ambulation- I  Managing Meds- I  Follow up appointments reviewed:  PCP Hospital f/u appt confirmed? Yes  Scheduled to see Dr. Redmond School on 06/18/21 @ 3;30. [facility] f/u appt confirmed? No   If their condition worsens, is the pt aware to call PCP or go to the Emergency Dept.? Yes Was the patient provided with contact information for the PCP's office or ED? Yes Was to pt encouraged to call back with questions or concerns? Yes

## 2021-06-13 DIAGNOSIS — G8929 Other chronic pain: Secondary | ICD-10-CM | POA: Diagnosis not present

## 2021-06-13 DIAGNOSIS — K863 Pseudocyst of pancreas: Secondary | ICD-10-CM | POA: Diagnosis not present

## 2021-06-13 DIAGNOSIS — Z01818 Encounter for other preprocedural examination: Secondary | ICD-10-CM | POA: Diagnosis not present

## 2021-06-13 DIAGNOSIS — K219 Gastro-esophageal reflux disease without esophagitis: Secondary | ICD-10-CM | POA: Diagnosis not present

## 2021-06-13 DIAGNOSIS — M549 Dorsalgia, unspecified: Secondary | ICD-10-CM | POA: Diagnosis not present

## 2021-06-13 DIAGNOSIS — E7849 Other hyperlipidemia: Secondary | ICD-10-CM | POA: Diagnosis not present

## 2021-06-13 DIAGNOSIS — I10 Essential (primary) hypertension: Secondary | ICD-10-CM | POA: Diagnosis not present

## 2021-06-18 ENCOUNTER — Encounter: Payer: Self-pay | Admitting: Family Medicine

## 2021-06-18 ENCOUNTER — Ambulatory Visit (INDEPENDENT_AMBULATORY_CARE_PROVIDER_SITE_OTHER): Payer: Medicare Other | Admitting: Family Medicine

## 2021-06-18 VITALS — BP 98/62 | HR 112 | Temp 99.9°F | Wt 159.0 lb

## 2021-06-18 DIAGNOSIS — R1114 Bilious vomiting: Secondary | ICD-10-CM

## 2021-06-18 DIAGNOSIS — R319 Hematuria, unspecified: Secondary | ICD-10-CM

## 2021-06-18 DIAGNOSIS — K863 Pseudocyst of pancreas: Secondary | ICD-10-CM | POA: Diagnosis not present

## 2021-06-18 LAB — POCT URINALYSIS DIP (PROADVANTAGE DEVICE)
Glucose, UA: NEGATIVE mg/dL
Leukocytes, UA: NEGATIVE
Nitrite, UA: NEGATIVE
Protein Ur, POC: 30 mg/dL — AB
Specific Gravity, Urine: 1.015
Urobilinogen, Ur: 2
pH, UA: 6 (ref 5.0–8.0)

## 2021-06-18 MED ORDER — ONDANSETRON HCL 4 MG PO TABS: 4.0000 mg | ORAL_TABLET | Freq: Three times a day (TID) | ORAL | 0 refills | Status: DC | PRN

## 2021-06-18 NOTE — Addendum Note (Signed)
Addended by: Elyse Jarvis on: 06/18/2021 05:06 PM   Modules accepted: Orders

## 2021-06-18 NOTE — Progress Notes (Addendum)
   Subjective:    Patient ID: Amber Church, female    DOB: Nov 30, 1955, 66 y.o.   MRN: [id number]  HPI She is here for follow-up visit.  She has had a quite eventful last month or so.  She initially had laparoscopic cholecystectomy in May followed by continued difficulty and a diagnosis of a pancreatic pseudocyst.  She was transferred to [facility] to get this thing taken care of him.  Her last visit was June 6 when she had another endoscopy and a procedure to clean up debris.  When she awakened, she was in a lot of pain.  They did give her codeine for that.  Since then she has been able to eat and states that her bowel habits have gotten better but they are still loose.  In the last 2 days she has had some nausea and only vomited once per day but it was bilious in nature.  No fever or chills.  She is scheduled for repeat endoscopy on June 14.   Review of Systems     Objective:   Physical Exam Alert and toxic appearing.  Cardiac exam shows regular rhythm without murmurs or gallops.  Lungs clear to auscultation.  Abdominal exam shows decreased bowel sounds and tenderness to palpation in the mid abdominal area but no rebound. Medical note from [place] from June 7 was reviewed. Urine microscopic was negative     Assessment & Plan:  Pancreatic pseudocyst - Plan: ondansetron (ZOFRAN) 4 MG tablet, CBC with Differential/Platelet, Comprehensive metabolic panel  Bilious vomiting with nausea - Plan: ondansetron (ZOFRAN) 4 MG tablet, CBC with Differential/Platelet, Comprehensive metabolic panel I will give her nausea medication and do some blood work on her and hopefully we can tide her over until she is seen at [facility] on the 14th.  I will check electrolytes.  Did not feel the need to check for pancreatic enzymes under the present circumstances. Hopefully her electrolytes will be adequate.

## 2021-06-19 LAB — CBC WITH DIFFERENTIAL/PLATELET
Basophils Absolute: 0.1 10*3/uL (ref 0.0–0.2)
Basos: 0 %
EOS (ABSOLUTE): 0 10*3/uL (ref 0.0–0.4)
Eos: 0 %
Hematocrit: 32.5 % — ABNORMAL LOW (ref 34.0–46.6)
Hemoglobin: 11.1 g/dL (ref 11.1–15.9)
Immature Grans (Abs): 0.5 10*3/uL — ABNORMAL HIGH (ref 0.0–0.1)
Immature Granulocytes: 2 %
Lymphocytes Absolute: 2.2 10*3/uL (ref 0.7–3.1)
Lymphs: 10 %
MCH: 29.5 pg (ref 26.6–33.0)
MCHC: 34.2 g/dL (ref 31.5–35.7)
MCV: 86 fL (ref 79–97)
Monocytes Absolute: 1.4 10*3/uL — ABNORMAL HIGH (ref 0.1–0.9)
Monocytes: 7 %
Neutrophils Absolute: 16.8 10*3/uL — ABNORMAL HIGH (ref 1.4–7.0)
Neutrophils: 81 %
Platelets: 515 10*3/uL — ABNORMAL HIGH (ref 150–450)
RBC: 3.76 x10E6/uL — ABNORMAL LOW (ref 3.77–5.28)
RDW: 13.4 % (ref 11.7–15.4)
WBC: 20.9 10*3/uL (ref 3.4–10.8)

## 2021-06-19 LAB — COMPREHENSIVE METABOLIC PANEL
ALT: 14 IU/L (ref 0–32)
AST: 18 IU/L (ref 0–40)
Albumin/Globulin Ratio: 1 — ABNORMAL LOW (ref 1.2–2.2)
Albumin: 3.2 g/dL — ABNORMAL LOW (ref 3.8–4.8)
Alkaline Phosphatase: 128 IU/L — ABNORMAL HIGH (ref 44–121)
BUN/Creatinine Ratio: 14 (ref 12–28)
BUN: 10 mg/dL (ref 8–27)
Bilirubin Total: 0.5 mg/dL (ref 0.0–1.2)
CO2: 24 mmol/L (ref 20–29)
Calcium: 8.6 mg/dL — ABNORMAL LOW (ref 8.7–10.3)
Chloride: 102 mmol/L (ref 96–106)
Creatinine, Ser: 0.69 mg/dL (ref 0.57–1.00)
Globulin, Total: 3.3 g/dL (ref 1.5–4.5)
Glucose: 113 mg/dL — ABNORMAL HIGH (ref 70–99)
Potassium: 4.1 mmol/L (ref 3.5–5.2)
Sodium: 142 mmol/L (ref 134–144)
Total Protein: 6.5 g/dL (ref 6.0–8.5)
eGFR: 96 mL/min/{1.73_m2} (ref >59)

## 2021-06-20 DIAGNOSIS — Z9049 Acquired absence of other specified parts of digestive tract: Secondary | ICD-10-CM | POA: Diagnosis not present

## 2021-06-20 DIAGNOSIS — Z79899 Other long term (current) drug therapy: Secondary | ICD-10-CM | POA: Diagnosis not present

## 2021-06-20 DIAGNOSIS — E785 Hyperlipidemia, unspecified: Secondary | ICD-10-CM | POA: Diagnosis not present

## 2021-06-20 DIAGNOSIS — Z978 Presence of other specified devices: Secondary | ICD-10-CM | POA: Diagnosis not present

## 2021-06-20 DIAGNOSIS — K3189 Other diseases of stomach and duodenum: Secondary | ICD-10-CM | POA: Diagnosis not present

## 2021-06-20 DIAGNOSIS — K863 Pseudocyst of pancreas: Secondary | ICD-10-CM | POA: Diagnosis not present

## 2021-06-20 DIAGNOSIS — K8689 Other specified diseases of pancreas: Secondary | ICD-10-CM | POA: Diagnosis not present

## 2021-06-20 DIAGNOSIS — K766 Portal hypertension: Secondary | ICD-10-CM | POA: Diagnosis not present

## 2021-06-20 DIAGNOSIS — K851 Biliary acute pancreatitis without necrosis or infection: Secondary | ICD-10-CM | POA: Diagnosis not present

## 2021-06-20 DIAGNOSIS — M1990 Unspecified osteoarthritis, unspecified site: Secondary | ICD-10-CM | POA: Diagnosis not present

## 2021-06-20 DIAGNOSIS — I10 Essential (primary) hypertension: Secondary | ICD-10-CM | POA: Diagnosis not present

## 2021-06-20 DIAGNOSIS — K219 Gastro-esophageal reflux disease without esophagitis: Secondary | ICD-10-CM | POA: Diagnosis not present

## 2021-06-20 DIAGNOSIS — Z9889 Other specified postprocedural states: Secondary | ICD-10-CM | POA: Diagnosis not present

## 2021-07-02 DIAGNOSIS — K863 Pseudocyst of pancreas: Secondary | ICD-10-CM | POA: Diagnosis not present

## 2021-07-02 DIAGNOSIS — K8689 Other specified diseases of pancreas: Secondary | ICD-10-CM | POA: Diagnosis not present

## 2021-07-02 DIAGNOSIS — K862 Cyst of pancreas: Secondary | ICD-10-CM | POA: Diagnosis not present

## 2021-07-02 DIAGNOSIS — K851 Biliary acute pancreatitis without necrosis or infection: Secondary | ICD-10-CM | POA: Diagnosis not present

## 2021-07-05 ENCOUNTER — Ambulatory Visit: Payer: Medicare Other | Admitting: Orthopaedic Surgery

## 2021-07-06 DIAGNOSIS — Z978 Presence of other specified devices: Secondary | ICD-10-CM | POA: Diagnosis not present

## 2021-07-06 DIAGNOSIS — Z4659 Encounter for fitting and adjustment of other gastrointestinal appliance and device: Secondary | ICD-10-CM | POA: Diagnosis not present

## 2021-07-06 DIAGNOSIS — K317 Polyp of stomach and duodenum: Secondary | ICD-10-CM | POA: Diagnosis not present

## 2021-07-06 DIAGNOSIS — Z4689 Encounter for fitting and adjustment of other specified devices: Secondary | ICD-10-CM | POA: Diagnosis not present

## 2021-07-06 DIAGNOSIS — Z96651 Presence of right artificial knee joint: Secondary | ICD-10-CM | POA: Diagnosis not present

## 2021-07-06 DIAGNOSIS — E785 Hyperlipidemia, unspecified: Secondary | ICD-10-CM | POA: Diagnosis not present

## 2021-07-06 DIAGNOSIS — I10 Essential (primary) hypertension: Secondary | ICD-10-CM | POA: Diagnosis not present

## 2021-07-06 DIAGNOSIS — Z8719 Personal history of other diseases of the digestive system: Secondary | ICD-10-CM | POA: Diagnosis not present

## 2021-07-06 DIAGNOSIS — K219 Gastro-esophageal reflux disease without esophagitis: Secondary | ICD-10-CM | POA: Diagnosis not present

## 2021-07-06 DIAGNOSIS — Z9049 Acquired absence of other specified parts of digestive tract: Secondary | ICD-10-CM | POA: Diagnosis not present

## 2021-07-17 ENCOUNTER — Encounter: Payer: Self-pay | Admitting: Family Medicine

## 2021-07-17 ENCOUNTER — Ambulatory Visit (INDEPENDENT_AMBULATORY_CARE_PROVIDER_SITE_OTHER): Payer: Medicare Other | Admitting: Family Medicine

## 2021-07-17 VITALS — BP 96/60 | HR 118 | Temp 98.1°F | Wt 148.6 lb

## 2021-07-17 DIAGNOSIS — K851 Biliary acute pancreatitis without necrosis or infection: Secondary | ICD-10-CM

## 2021-07-17 DIAGNOSIS — K5900 Constipation, unspecified: Secondary | ICD-10-CM | POA: Diagnosis not present

## 2021-07-17 DIAGNOSIS — Z9049 Acquired absence of other specified parts of digestive tract: Secondary | ICD-10-CM | POA: Diagnosis not present

## 2021-07-17 DIAGNOSIS — M25512 Pain in left shoulder: Secondary | ICD-10-CM

## 2021-07-17 NOTE — Patient Instructions (Signed)
You can take 1Tylenol arthritis tablets 3 times per day.  Do that regularly for the next 10 days or so to see if that takes away  Make sure you drink plenty of fluids and also start taking MiraLAX

## 2021-07-17 NOTE — Progress Notes (Signed)
   Subjective:    Patient ID: Amber Church, female    DOB: 12-11-1955, 66 y.o.   MRN: [id number]  HPI She is here for recheck.  She was seen June 30 and had an endoscopy done.  The stents were removed.  She states that she is feeling better in regard to her abdominal pain but has had difficulty with constipation as well as a slight cough and left shoulder pain.  The shoulder pain occurred after she had the endoscopy and she also notes that she had a previous episode of shoulder pain before one of her other endoscopies.  That particular pain did go away but this 1 has lasted for several weeks.  She also complains of a slight cough.   Review of Systems     Objective:   Physical Exam Alert and in no distress.  No palpable tenderness to the clavicle or shoulder.  No tenderness over the [place] joint: Normal motion of the shoulder.  Lungs are clear to auscultation.  Cardiac exam shows regular rhythm without murmurs or gallops.       Assessment & Plan:  Gallstone pancreatitis  History of laparoscopic cholecystectomy  Constipation, unspecified constipation type I explained that I thought the shoulder pain was probably position related during her endoscopy.  Recommend Tylenol for pain relief.  Also recommend MiraLAX to help with her constipation.  Discussed difficulty she was having with hemorrhoids and recommended manual manipulation of this to see if that would help.  She will keep me informed concerning that.  I also reassured her that I thought her cough would go away with time.  If continued difficulty she will return here.  Over 30 minutes spent discussing all these issues with her.

## 2021-07-19 DIAGNOSIS — Z8262 Family history of osteoporosis: Secondary | ICD-10-CM | POA: Diagnosis not present

## 2021-07-19 DIAGNOSIS — K8592 Acute pancreatitis with infected necrosis, unspecified: Secondary | ICD-10-CM | POA: Diagnosis not present

## 2021-07-19 DIAGNOSIS — Z833 Family history of diabetes mellitus: Secondary | ICD-10-CM | POA: Diagnosis not present

## 2021-07-19 DIAGNOSIS — E785 Hyperlipidemia, unspecified: Secondary | ICD-10-CM | POA: Diagnosis present

## 2021-07-19 DIAGNOSIS — Z820 Family history of epilepsy and other diseases of the nervous system: Secondary | ICD-10-CM | POA: Diagnosis not present

## 2021-07-19 DIAGNOSIS — F419 Anxiety disorder, unspecified: Secondary | ICD-10-CM | POA: Diagnosis present

## 2021-07-19 DIAGNOSIS — R109 Unspecified abdominal pain: Secondary | ICD-10-CM | POA: Diagnosis not present

## 2021-07-19 DIAGNOSIS — Z79899 Other long term (current) drug therapy: Secondary | ICD-10-CM | POA: Diagnosis not present

## 2021-07-19 DIAGNOSIS — K219 Gastro-esophageal reflux disease without esophagitis: Secondary | ICD-10-CM | POA: Diagnosis not present

## 2021-07-19 DIAGNOSIS — K8591 Acute pancreatitis with uninfected necrosis, unspecified: Secondary | ICD-10-CM | POA: Diagnosis not present

## 2021-07-19 DIAGNOSIS — Z9049 Acquired absence of other specified parts of digestive tract: Secondary | ICD-10-CM | POA: Diagnosis not present

## 2021-07-19 DIAGNOSIS — Z96651 Presence of right artificial knee joint: Secondary | ICD-10-CM | POA: Diagnosis not present

## 2021-07-19 DIAGNOSIS — F32A Depression, unspecified: Secondary | ICD-10-CM | POA: Diagnosis present

## 2021-07-19 DIAGNOSIS — Z801 Family history of malignant neoplasm of trachea, bronchus and lung: Secondary | ICD-10-CM

## 2021-07-19 DIAGNOSIS — I10 Essential (primary) hypertension: Secondary | ICD-10-CM | POA: Diagnosis present

## 2021-07-19 DIAGNOSIS — Z83438 Family history of other disorder of lipoprotein metabolism and other lipidemia: Secondary | ICD-10-CM

## 2021-07-19 DIAGNOSIS — D72829 Elevated white blood cell count, unspecified: Secondary | ICD-10-CM | POA: Diagnosis not present

## 2021-07-19 DIAGNOSIS — K8581 Other acute pancreatitis with uninfected necrosis: Secondary | ICD-10-CM | POA: Diagnosis not present

## 2021-07-19 DIAGNOSIS — D649 Anemia, unspecified: Secondary | ICD-10-CM | POA: Diagnosis present

## 2021-07-19 DIAGNOSIS — K8500 Idiopathic acute pancreatitis without necrosis or infection: Secondary | ICD-10-CM | POA: Diagnosis not present

## 2021-07-19 DIAGNOSIS — K863 Pseudocyst of pancreas: Secondary | ICD-10-CM | POA: Diagnosis not present

## 2021-07-20 ENCOUNTER — Emergency Department (HOSPITAL_COMMUNITY): Payer: Medicare Other

## 2021-07-20 ENCOUNTER — Encounter (HOSPITAL_COMMUNITY): Payer: Self-pay | Admitting: Emergency Medicine

## 2021-07-20 ENCOUNTER — Inpatient Hospital Stay (HOSPITAL_COMMUNITY)
Admission: EM | Admit: 2021-07-20 | Discharge: 2021-07-24 | DRG: 440 | Disposition: A | Discharge status: Home / Self Care | Payer: Medicare Other | Attending: Family Medicine | Admitting: Family Medicine

## 2021-07-20 ENCOUNTER — Other Ambulatory Visit: Payer: Self-pay

## 2021-07-20 DIAGNOSIS — K8592 Acute pancreatitis with infected necrosis, unspecified: Secondary | ICD-10-CM | POA: Diagnosis not present

## 2021-07-20 DIAGNOSIS — I10 Essential (primary) hypertension: Secondary | ICD-10-CM | POA: Diagnosis present

## 2021-07-20 DIAGNOSIS — D72829 Elevated white blood cell count, unspecified: Secondary | ICD-10-CM | POA: Diagnosis present

## 2021-07-20 DIAGNOSIS — E785 Hyperlipidemia, unspecified: Secondary | ICD-10-CM | POA: Diagnosis present

## 2021-07-20 DIAGNOSIS — K859 Acute pancreatitis without necrosis or infection, unspecified: Secondary | ICD-10-CM | POA: Diagnosis present

## 2021-07-20 DIAGNOSIS — K863 Pseudocyst of pancreas: Secondary | ICD-10-CM | POA: Diagnosis not present

## 2021-07-20 DIAGNOSIS — K219 Gastro-esophageal reflux disease without esophagitis: Secondary | ICD-10-CM | POA: Diagnosis present

## 2021-07-20 DIAGNOSIS — Z820 Family history of epilepsy and other diseases of the nervous system: Secondary | ICD-10-CM | POA: Diagnosis not present

## 2021-07-20 DIAGNOSIS — F32A Depression, unspecified: Secondary | ICD-10-CM | POA: Diagnosis present

## 2021-07-20 DIAGNOSIS — K8500 Idiopathic acute pancreatitis without necrosis or infection: Secondary | ICD-10-CM | POA: Diagnosis not present

## 2021-07-20 DIAGNOSIS — Z79899 Other long term (current) drug therapy: Secondary | ICD-10-CM | POA: Diagnosis not present

## 2021-07-20 DIAGNOSIS — R1114 Bilious vomiting: Secondary | ICD-10-CM

## 2021-07-20 DIAGNOSIS — K8591 Acute pancreatitis with uninfected necrosis, unspecified: Principal | ICD-10-CM

## 2021-07-20 DIAGNOSIS — Z801 Family history of malignant neoplasm of trachea, bronchus and lung: Secondary | ICD-10-CM | POA: Diagnosis not present

## 2021-07-20 DIAGNOSIS — F419 Anxiety disorder, unspecified: Secondary | ICD-10-CM | POA: Diagnosis present

## 2021-07-20 DIAGNOSIS — Z8262 Family history of osteoporosis: Secondary | ICD-10-CM | POA: Diagnosis not present

## 2021-07-20 DIAGNOSIS — Z833 Family history of diabetes mellitus: Secondary | ICD-10-CM | POA: Diagnosis not present

## 2021-07-20 DIAGNOSIS — Z96651 Presence of right artificial knee joint: Secondary | ICD-10-CM | POA: Diagnosis present

## 2021-07-20 DIAGNOSIS — D649 Anemia, unspecified: Secondary | ICD-10-CM | POA: Diagnosis present

## 2021-07-20 DIAGNOSIS — K8581 Other acute pancreatitis with uninfected necrosis: Secondary | ICD-10-CM | POA: Diagnosis not present

## 2021-07-20 DIAGNOSIS — Z9049 Acquired absence of other specified parts of digestive tract: Secondary | ICD-10-CM | POA: Diagnosis not present

## 2021-07-20 DIAGNOSIS — Z83438 Family history of other disorder of lipoprotein metabolism and other lipidemia: Secondary | ICD-10-CM | POA: Diagnosis not present

## 2021-07-20 LAB — COMPREHENSIVE METABOLIC PANEL
ALT: 11 U/L (ref 0–44)
AST: 14 U/L — ABNORMAL LOW (ref 15–41)
Albumin: 3.3 g/dL — ABNORMAL LOW (ref 3.5–5.0)
Alkaline Phosphatase: 88 U/L (ref 38–126)
Anion gap: 11 (ref 5–15)
BUN: 14 mg/dL (ref 8–23)
CO2: 24 mmol/L (ref 22–32)
Calcium: 8.5 mg/dL — ABNORMAL LOW (ref 8.9–10.3)
Chloride: 100 mmol/L (ref 98–111)
Creatinine, Ser: 0.82 mg/dL (ref 0.44–1.00)
GFR, Estimated: >60 mL/min (ref >60)
Glucose, Bld: 102 mg/dL — ABNORMAL HIGH (ref 70–99)
Potassium: 3.5 mmol/L (ref 3.5–5.1)
Sodium: 135 mmol/L (ref 135–145)
Total Bilirubin: 0.5 mg/dL (ref 0.3–1.2)
Total Protein: 7.7 g/dL (ref 6.5–8.1)

## 2021-07-20 LAB — CBC
HCT: 34.2 % — ABNORMAL LOW (ref 36.0–46.0)
Hemoglobin: 10.8 g/dL — ABNORMAL LOW (ref 12.0–15.0)
MCH: 27.6 pg (ref 26.0–34.0)
MCHC: 31.6 g/dL (ref 30.0–36.0)
MCV: 87.2 fL (ref 80.0–100.0)
Platelets: 534 10*3/uL — ABNORMAL HIGH (ref 150–400)
RBC: 3.92 MIL/uL (ref 3.87–5.11)
RDW: 14.9 % (ref 11.5–15.5)
WBC: 14.4 10*3/uL — ABNORMAL HIGH (ref 4.0–10.5)
nRBC: 0 % (ref 0.0–0.2)

## 2021-07-20 LAB — GLUCOSE, CAPILLARY
Glucose-Capillary: 84 mg/dL (ref 70–99)
Glucose-Capillary: 105 mg/dL — ABNORMAL HIGH (ref 70–99)

## 2021-07-20 LAB — LIPASE, BLOOD: Lipase: 345 U/L — ABNORMAL HIGH (ref 11–51)

## 2021-07-20 LAB — PROTIME-INR
INR: 1 (ref 0.8–1.2)
Prothrombin Time: 13 seconds (ref 11.4–15.2)

## 2021-07-20 MED ORDER — PRAVASTATIN SODIUM 40 MG PO TABS
40.0000 mg | ORAL_TABLET | Freq: Every day | ORAL | Status: DC
  Administered 2021-07-20 – 2021-07-23 (×4): 40 mg via ORAL
  Filled 2021-07-20 (×4): qty 1

## 2021-07-20 MED ORDER — SODIUM CHLORIDE 0.9 % IV BOLUS
1000.0000 mL | Freq: Once | INTRAVENOUS | Status: AC
  Administered 2021-07-20: 1000 mL via INTRAVENOUS

## 2021-07-20 MED ORDER — ACETAMINOPHEN 650 MG RE SUPP: 650.0000 mg | Freq: Four times a day (QID) | RECTAL | Status: DC | PRN

## 2021-07-20 MED ORDER — OXYCODONE HCL 5 MG PO TABS
5.0000 mg | ORAL_TABLET | Freq: Four times a day (QID) | ORAL | Status: DC | PRN
  Administered 2021-07-20 – 2021-07-24 (×9): 5 mg via ORAL
  Filled 2021-07-20 (×9): qty 1

## 2021-07-20 MED ORDER — SODIUM CHLORIDE 0.9% FLUSH
3.0000 mL | Freq: Two times a day (BID) | INTRAVENOUS | Status: DC
  Administered 2021-07-20 – 2021-07-24 (×8): 3 mL via INTRAVENOUS

## 2021-07-20 MED ORDER — IOHEXOL 300 MG/ML  SOLN
100.0000 mL | Freq: Once | INTRAMUSCULAR | Status: AC | PRN
Start: 2021-07-20 — End: 2021-07-20
  Administered 2021-07-20: 100 mL via INTRAVENOUS

## 2021-07-20 MED ORDER — BISACODYL 10 MG RE SUPP
10.0000 mg | Freq: Once | RECTAL | Status: AC
  Administered 2021-07-20: 10 mg via RECTAL
  Filled 2021-07-20: qty 1

## 2021-07-20 MED ORDER — PANTOPRAZOLE SODIUM 40 MG IV SOLR
40.0000 mg | Freq: Every day | INTRAVENOUS | Status: DC
Start: 2021-07-20 — End: 2021-07-24
  Administered 2021-07-20 – 2021-07-24 (×5): 40 mg via INTRAVENOUS
  Filled 2021-07-20 (×5): qty 10

## 2021-07-20 MED ORDER — TRAZODONE HCL 50 MG PO TABS: 50.0000 mg | ORAL_TABLET | Freq: Every evening | ORAL | Status: DC | PRN

## 2021-07-20 MED ORDER — BISACODYL 10 MG RE SUPP: 10.0000 mg | Freq: Every day | RECTAL | Status: DC | PRN

## 2021-07-20 MED ORDER — ONDANSETRON HCL 4 MG PO TABS: 4.0000 mg | ORAL_TABLET | Freq: Four times a day (QID) | ORAL | Status: DC | PRN

## 2021-07-20 MED ORDER — HYDROMORPHONE HCL 1 MG/ML IJ SOLN
1.0000 mg | Freq: Once | INTRAMUSCULAR | Status: AC
  Administered 2021-07-20: 1 mg via INTRAVENOUS
  Filled 2021-07-20: qty 1

## 2021-07-20 MED ORDER — HYDRALAZINE HCL 20 MG/ML IJ SOLN: 10.0000 mg | Freq: Four times a day (QID) | INTRAMUSCULAR | Status: DC | PRN

## 2021-07-20 MED ORDER — LACTATED RINGERS IV BOLUS
1000.0000 mL | Freq: Once | INTRAVENOUS | Status: AC
  Administered 2021-07-20: 1000 mL via INTRAVENOUS

## 2021-07-20 MED ORDER — FENTANYL CITRATE PF 50 MCG/ML IJ SOSY
50.0000 ug | PREFILLED_SYRINGE | INTRAMUSCULAR | Status: DC | PRN
  Administered 2021-07-20 – 2021-07-23 (×12): 50 ug via INTRAVENOUS
  Filled 2021-07-20 (×12): qty 1

## 2021-07-20 MED ORDER — ONDANSETRON HCL 4 MG/2ML IJ SOLN
4.0000 mg | Freq: Once | INTRAMUSCULAR | Status: AC
  Administered 2021-07-20: 4 mg via INTRAVENOUS
  Filled 2021-07-20: qty 2

## 2021-07-20 MED ORDER — LACTATED RINGERS IV SOLN
INTRAVENOUS | Status: DC
Start: 2021-07-20 — End: 2021-07-24

## 2021-07-20 MED ORDER — ONDANSETRON HCL 4 MG/2ML IJ SOLN: 4.0000 mg | Freq: Four times a day (QID) | INTRAMUSCULAR | Status: DC | PRN

## 2021-07-20 MED ORDER — POTASSIUM CHLORIDE CRYS ER 20 MEQ PO TBCR
40.0000 meq | EXTENDED_RELEASE_TABLET | Freq: Once | ORAL | Status: AC
  Administered 2021-07-20: 40 meq via ORAL
  Filled 2021-07-20: qty 2

## 2021-07-20 MED ORDER — POLYETHYLENE GLYCOL 3350 17 G PO PACK
17.0000 g | PACK | Freq: Every day | ORAL | Status: DC | PRN
  Filled 2021-07-20: qty 1

## 2021-07-20 MED ORDER — SODIUM CHLORIDE 0.9% FLUSH
3.0000 mL | Freq: Two times a day (BID) | INTRAVENOUS | Status: DC
Start: 2021-07-20 — End: 2021-07-24
  Administered 2021-07-20 – 2021-07-24 (×4): 3 mL via INTRAVENOUS

## 2021-07-20 MED ORDER — HEPARIN SODIUM (PORCINE) 5000 UNIT/ML IJ SOLN
5000.0000 [IU] | Freq: Three times a day (TID) | INTRAMUSCULAR | Status: DC
  Administered 2021-07-20 – 2021-07-24 (×13): 5000 [IU] via SUBCUTANEOUS
  Filled 2021-07-20 (×12): qty 1

## 2021-07-20 MED ORDER — ACETAMINOPHEN 325 MG PO TABS: 650.0000 mg | ORAL_TABLET | Freq: Four times a day (QID) | ORAL | Status: DC | PRN

## 2021-07-20 MED ORDER — SODIUM CHLORIDE 0.9 % IV SOLN: INTRAVENOUS | Status: DC | PRN

## 2021-07-20 MED ORDER — HYDROMORPHONE HCL 1 MG/ML IJ SOLN
0.5000 mg | Freq: Once | INTRAMUSCULAR | Status: AC
  Administered 2021-07-20: 0.5 mg via INTRAVENOUS
  Filled 2021-07-20: qty 0.5

## 2021-07-20 MED ORDER — SODIUM CHLORIDE 0.9% FLUSH: 3.0000 mL | INTRAVENOUS | Status: DC | PRN

## 2021-07-20 NOTE — ED Provider Notes (Signed)
[facility] Emergency Department Provider Note MRN:  [id number]  Arrival date & time: 07/20/21     Chief Complaint   Abdominal Pain   History of Present Illness   Amber Church is a 66 y.o. year-old female with a history of hypertension presenting to the ED with chief complaint of abdominal pain.  Recent cholecystectomy here at [facility], then she went to [facility] for removal or drainage of a pancreatic cyst.  Surgery was about 3 weeks ago.  Had been recovering well up until this evening when she began having severe epigastric abdominal pain.  Denies nausea vomiting, no diarrhea, no fever, no other complaints  Review of Systems  A thorough review of systems was obtained and all systems are negative except as noted in the HPI and PMH.   Patient's Health History    Past Medical History:  Diagnosis Date   Abnormal EKG    Abnormal pap    s/p cryotherapy   Anxiety    Arthritis    Depression    Diverticulosis    GERD (gastroesophageal reflux disease)    Hyperlipidemia    Hypertension    S/P colonoscopic polypectomy     Past Surgical History:  Procedure Laterality Date   CHOLECYSTECTOMY N/A 05/18/2021   Procedure: LAPAROSCOPIC CHOLECYSTECTOMY;  Surgeon: Rusty Aus, DO;  Location: [facility];  Service: General;  Laterality: N/A;   COLONOSCOPY  03/18/11; 2000   Dr. Amedeo Plenty; diverticulosis in sigmoid colon   crysurgery  age 89   for abnormal pap   LAMINECTOMY  03/2002   TOTAL KNEE ARTHROPLASTY Right 10/02/2018   TOTAL KNEE ARTHROPLASTY Right 10/02/2018   Procedure: RIGHT TOTAL KNEE ARTHROPLASTY;  Surgeon: Newt Minion, MD;  Location: [facility];  Service: Orthopedics;  Laterality: Right;    Family History  Problem Relation Age of Onset   Alzheimer's disease Mother    Hyperlipidemia Mother    Other Father        died of gunshot wound   Osteoporosis Sister    Osteoporosis Sister    ALS Maternal Aunt    ALS Maternal Uncle    Diabetes Paternal  Aunt    Cancer Maternal Grandfather        lung cancer   Cancer Paternal Grandmother        ?type   Heart disease Neg Hx     Social History   Socioeconomic History   Marital status: Married    Spouse name: Not on file   Number of children: 1   Years of education: Not on file   Highest education level: Not on file  Occupational History   Occupation: quality control    Employer: [employer]  Tobacco Use   Smoking status: Never   Smokeless tobacco: Never  Vaping Use   Vaping Use: Never used  Substance and Sexual Activity   Alcohol use: Yes    Comment: glass of wine 1-2 times per week.   Drug use: No   Sexual activity: Yes    Partners: Male    Comment: postmenopausal  Other Topics Concern   Not on file  Social History Narrative   Lives with husband and daughter Chiropractor), 1 dog, 1 cat   Social Determinants of Health   Financial Resource Strain: Not on file  Food Insecurity: Not on file  Transportation Needs: Not on file  Physical Activity: Not on file  Stress: Not on file  Social Connections: Not on file  Intimate  Partner Violence: Not on file     Physical Exam   Vitals:   07/20/21 0500 07/20/21 0546  BP:  94/70  Pulse: 72 75  Resp: 20 16  Temp:    SpO2: 96% 100%    CONSTITUTIONAL: Well-appearing, NAD NEURO/PSYCH:  Alert and oriented x 3, no focal deficits EYES:  eyes equal and reactive ENT/NECK:  no LAD, no JVD CARDIO: Regular rate, well-perfused, normal S1 and S2 PULM:  CTAB no wheezing or rhonchi GI/GU:  non-distended, moderate epigastric tenderness to palpation MSK/SPINE:  No gross deformities, no edema SKIN:  no rash, atraumatic   *Additional and/or pertinent findings included in MDM below  Diagnostic and Interventional Summary    EKG Interpretation  Date/Time:    Ventricular Rate:    PR Interval:    QRS Duration:   QT Interval:    QTC Calculation:   R Axis:     Text Interpretation:         Labs Reviewed  CBC -  Abnormal; Notable for the following components:      Result Value   WBC 14.4 (*)    Hemoglobin 10.8 (*)    HCT 34.2 (*)    Platelets 534 (*)    All other components within normal limits  COMPREHENSIVE METABOLIC PANEL - Abnormal; Notable for the following components:   Glucose, Bld 102 (*)    Calcium 8.5 (*)    Albumin 3.3 (*)    AST 14 (*)    All other components within normal limits  LIPASE, BLOOD - Abnormal; Notable for the following components:   Lipase 345 (*)    All other components within normal limits  PROTIME-INR    CT ABDOMEN PELVIS W CONTRAST  Final Result      Medications  lactated ringers infusion ( Intravenous New Bag/Given 07/20/21 0401)  sodium chloride 0.9 % bolus 1,000 mL (0 mLs Intravenous Stopped 07/20/21 0231)  HYDROmorphone (DILAUDID) injection 0.5 mg (0.5 mg Intravenous Given 07/20/21 0126)  ondansetron (ZOFRAN) injection 4 mg (4 mg Intravenous Given 07/20/21 0126)  iohexol (OMNIPAQUE) 300 MG/ML solution 100 mL (100 mLs Intravenous Contrast Given 07/20/21 0231)  HYDROmorphone (DILAUDID) injection 1 mg (1 mg Intravenous Given 07/20/21 0254)  lactated ringers bolus 1,000 mL (0 mLs Intravenous Stopped 07/20/21 0502)  HYDROmorphone (DILAUDID) injection 1 mg (1 mg Intravenous Given 07/20/21 0402)  HYDROmorphone (DILAUDID) injection 1 mg (1 mg Intravenous Given 07/20/21 0543)     Procedures  /  Critical Care Procedures  ED Course and Medical Decision Making  Initial Impression and Ddx Acute return of epigastric pain, only a few weeks removed from pancreatic cyst removal or drainage at [facility].  Vital signs reassuring, in obvious discomfort on exam, moderate tenderness, awaiting labs, CT.  Past medical/surgical history that increases complexity of ED encounter: History of recent cholecystectomy and removal or drainage of large pancreatic pseudocyst  Interpretation of Diagnostics I personally reviewed the laboratory assessment and my interpretation is as follows: No  significant blood count or electrolyte disturbance, mild lipase elevation.  CT imaging revealing acute pancreatitis with area of possible necrosis.  Patient Reassessment and Ultimate Disposition/Management     Case discussed with the [facility] transfer line, unfortunately they are unable to accept the patient due to capacity.  We will attempt admission here at [facility] for management of acute pancreatitis.  Patient management required discussion with the following services or consulting groups:  Hospitalist Service  Complexity of Problems Addressed Acute illness or injury that poses  threat of life of bodily function  Additional Data Reviewed and Analyzed Further history obtained from: Care Everywhere  Additional Factors Impacting ED Encounter Risk Use of parenteral controlled substances and Consideration of hospitalization  Barth Kirks. Sedonia Small, MD [place] [email or web address]  Final Clinical Impressions(s) / ED Diagnoses     ICD-10-CM   1. Acute pancreatitis with uninfected necrosis, unspecified pancreatitis type  K85.91       ED Discharge Orders     None        Discharge Instructions Discussed with and Provided to Patient:   Discharge Instructions   None      Maudie Flakes, MD 07/20/21 [phone]

## 2021-07-20 NOTE — ED Notes (Signed)
ED Provider at bedside. 

## 2021-07-20 NOTE — ED Triage Notes (Signed)
Pt arrives POV c/o LUQ abd pain since about 2030 tonight. Pt had gallbladder removed in May, pt then developed an abscess that was removed recently at [facility].

## 2021-07-20 NOTE — TOC Progression Note (Signed)
  Transition of Care (TOC) Screening Note   Patient Details  Name: JATZIRI GOFFREDO Date of Birth: [birth year]   Transition of Care [facility]) CM/SW Contact:    Shade Flood, LCSW Phone Number: 07/20/2021, 10:20 AM    Transition of Care Department [facility]) has reviewed patient and no TOC needs have been identified at this time. We will continue to monitor patient advancement through interdisciplinary progression rounds. If new patient transition needs arise, please place a TOC consult.

## 2021-07-20 NOTE — H&P (Addendum)
Patient Demographics:    Amber Church, is a 66 y.o. female  MRN: [id number]   DOB - 05/24/1955  Admit Date - 07/20/2021  Outpatient Primary MD for the patient is Denita Lung, MD   Assessment & Plan:   Assessment and Plan: 1)Acute Pancreatitis- -Extensive biliary/pancreatic history recently:- had lap chole on 05/18/2021 due to acute cholecystitis with gallstone pancreatitis, subsequently developed pancreatic pseudocyst and necrotizing pancreatitis--she was transferred to [facility] on 06/03/2021 where she underwent EGD with transmural drainage of pseudocyst with stent placement, as well as dilatation of gastric outlet on 06/06/2021 , She has had subsequent necrosectomy on 06/11/21 and 06/20/21 She also underwent  EUS guided AXIOS with cystgastrostomy stent removal on 07/06/2021 at [facility] --Lipase is 345 up from 56 on 06/02/2021 -WBC is 14.4  -LFTs WNL -CT abdomen and pelvis consistent with mild acute/subacute pancreatitis with evidence of focal pancreatic necrosis -Clear liquids -IV fluids -Antiemetics and IV pain medications -GI consult requested  2)Chronic Anemia--Hgb is 10.8 which is close to recent baseline -Monitor closely  3)GERD--continue PPI  4)HLD--- stable, LFTs are not elevated, continue pravastatin  5)HTN--BP stable, hold lisinopril HCTZ at this time -May use IV hydralazine as needed  Disposition/Need for in-Hospital Stay- patient unable to be discharged at this time due to -acute pancreatitis requiring IV pain control and IV fluids  Status is: Inpatient  Remains inpatient appropriate because:   Dispo: The patient is from: Home              Anticipated d/c is to: Home              Anticipated d/c date is: 2 days              Patient currently is not medically stable to  d/c. Barriers: Not Clinically Stable-   With History of - Reviewed by me  Past Medical History:  Diagnosis Date   Abnormal EKG    Abnormal pap    s/p cryotherapy   Anxiety    Arthritis    Depression    Diverticulosis    GERD (gastroesophageal reflux disease)    Hyperlipidemia    Hypertension    S/P colonoscopic polypectomy       Past Surgical History:  Procedure Laterality Date   CHOLECYSTECTOMY N/A 05/18/2021   Procedure: LAPAROSCOPIC CHOLECYSTECTOMY;  Surgeon: Rusty Aus, DO;  Location: [facility];  Service: General;  Laterality: N/A;   COLONOSCOPY  03/18/11; 2000   Dr. Amedeo Plenty; diverticulosis in sigmoid colon   crysurgery  age 49   for abnormal pap   LAMINECTOMY  03/2002   TOTAL KNEE ARTHROPLASTY Right 10/02/2018   TOTAL KNEE ARTHROPLASTY Right 10/02/2018   Procedure: RIGHT TOTAL KNEE ARTHROPLASTY;  Surgeon: Newt Minion, MD;  Location: [place];  Service: Orthopedics;  Laterality: Right;     Chief Complaint  Patient presents with   Abdominal Pain      HPI:    Amber  Church  is a 66 y.o. female with past medical history relevant for hypertension, GERD and HLD who had lap chole on 05/18/2021 due to acute cholecystitis with gallstone pancreatitis, subsequently developed pancreatic pseudocyst and necrotizing pancreatitis--she was transferred to [facility] on 06/03/2021 where she underwent EGD with transmural drainage of pseudocyst with stent placement, as well as dilatation of gastric outlet on 06/06/2021 , She has had subsequent necrosectomy on 06/11/21 and 06/20/21 She also underwent  EUS guided AXIOS with cystgastrostomy stent removal on 07/06/2021 at [facility] -Now with increasing left upper quadrant pain since 8 PM on 07/19/2021 -Patient with nausea but no vomiting -No BM in the last 4 days No fever  Or chills  -Denies EtOH intake In ED--- CT abdomen and pelvis shows near complete resolution of prior pancreatic pseudocyst, there is evidence of mild acute  or subacute pancreatitis, as well as evidence of focal pancreatic necrosis -Lipase is 345 up from 56 on 06/02/2021 -WBC is 14.4  Hgb is 10.8 which is close to recent baseline -LFTs are not elevated, INR is 1.0   Review of systems:    In addition to the HPI above,   A full Review of  Systems was done, all other systems reviewed are negative except as noted above in HPI , .    Social History:  Reviewed by me    Social History   Tobacco Use   Smoking status: Never   Smokeless tobacco: Never  Substance Use Topics   Alcohol use: Yes    Comment: glass of wine 1-2 times per week.       Family History :  Reviewed by me    Family History  Problem Relation Age of Onset   Alzheimer's disease Mother    Hyperlipidemia Mother    Other Father        died of gunshot wound   Osteoporosis Sister    Osteoporosis Sister    ALS Maternal Aunt    ALS Maternal Uncle    Diabetes Paternal Aunt    Cancer Maternal Grandfather        lung cancer   Cancer Paternal Grandmother        ?type   Heart disease Neg Hx     Home Medications:   Prior to Admission medications   Medication Sig Start Date End Date Taking? Authorizing Provider  acetaminophen (TYLENOL) 650 MG CR tablet Take 650 mg by mouth every 8 (eight) hours as needed for pain or fever.   Yes [provider]  lisinopril-hydrochlorothiazide (ZESTORETIC) 10-12.5 MG tablet Take 1 tablet by mouth daily. 11/22/20  Yes [provider]  pantoprazole (PROTONIX) 40 MG tablet Take 40 mg by mouth daily. 06/08/21 06/08/22 Yes [provider]  pravastatin (PRAVACHOL) 40 MG tablet Take 40 mg by mouth daily. 11/22/20  Yes [provider]  ciprofloxacin (CIPRO) 500 MG tablet SMARTSIG:1 Tablet(s) By Mouth Every 12 Hours Patient not taking: Reported on 07/17/2021 06/09/21   [provider]  ondansetron (ZOFRAN) 4 MG tablet Take 1 tablet (4 mg total) by mouth every 8 (eight) hours as needed for nausea or  vomiting. Patient not taking: Reported on 07/20/2021 06/18/21   Denita Lung, MD  oxyCODONE (OXY IR/ROXICODONE) 5 MG immediate release tablet Take 5 mg by mouth every 6 (six) hours as needed. Patient not taking: Reported on 07/17/2021 06/09/21   [provider]     Allergies:    No Known Allergies   Physical Exam:   Vitals  Blood pressure 114/77, pulse 86, temperature 99 F (37.2 C), temperature source Oral, resp. rate 20, height '5\' 3"'$  (1.6 m), weight 70.4 kg, SpO2 96 %.  Physical Examination: General appearance - alert,  in no distress  Mental status - alert, oriented to person, place, and time,  Eyes - sclera anicteric Neck - supple, no JVD elevation , Chest - clear  to auscultation bilaterally, symmetrical air movement,  Heart - S1 and S2 normal, regular  Abdomen - soft,   nondistended, +BS, epigastric and left upper quadrant tenderness without rebound or guarding Neurological - screening mental status exam normal, neck supple without rigidity, cranial nerves II through XII intact, DTR's normal and symmetric Extremities - no pedal edema noted, intact peripheral pulses  Skin - warm, dry     Data Review:    CBC Recent Labs  Lab 07/20/21 0120  WBC 14.4*  HGB 10.8*  HCT 34.2*  PLT 534*  MCV 87.2  MCH 27.6  MCHC 31.6  RDW 14.9   ------------------------------------------------------------------------------------------------------------------  Chemistries  Recent Labs  Lab 07/20/21 0120  NA 135  K 3.5  CL 100  CO2 24  GLUCOSE 102*  BUN 14  CREATININE 0.82  CALCIUM 8.5*  AST 14*  ALT 11  ALKPHOS 88  BILITOT 0.5   ------------------------------------------------------------------------------------------------------------------ estimated creatinine clearance is 64.4 mL/min (by C-G formula based on SCr of 0.82 mg/dL). ------------------------------------------------------------------------------------------------------------------ No results for  input(s): "TSH", "T4TOTAL", "T3FREE", "THYROIDAB" in the last 72 hours.  Invalid input(s): "FREET3"   Coagulation profile Recent Labs  Lab 07/20/21 0120  INR 1.0   ------------------------------------------------------------------------------------------------------------------- No results for input(s): "DDIMER" in the last 72 hours. -------------------------------------------------------------------------------------------------------------------  Cardiac Enzymes No results for input(s): "CKMB", "TROPONINI", "MYOGLOBIN" in the last 168 hours.  Invalid input(s): "CK" ------------------------------------------------------------------------------------------------------------------ No results found for: "BNP"   ---------------------------------------------------------------------------------------------------------------  Urinalysis    Component Value Date/Time   COLORURINE YELLOW 05/30/2021 [place] 05/30/2021 1311   LABSPEC 1.015 06/18/2021 1705   PHURINE 6.0 05/30/2021 1311   GLUCOSEU NEGATIVE 05/30/2021 1311   HGBUR SMALL (A) 05/30/2021 1311   BILIRUBINUR moderate (A) 06/18/2021 1705   BILIRUBINUR neg 03/20/2017 1340   KETONESUR large (80) (A) 06/18/2021 1705   KETONESUR NEGATIVE 05/30/2021 1311   PROTEINUR =30 (A) 06/18/2021 1705   PROTEINUR NEGATIVE 05/30/2021 1311   UROBILINOGEN negative (A) 03/20/2017 1340   NITRITE Negative 06/18/2021 1705   NITRITE NEGATIVE 05/30/2021 1311   LEUKOCYTESUR Negative 06/18/2021 1705   LEUKOCYTESUR NEGATIVE 05/30/2021 1311    ----------------------------------------------------------------------------------------------------------------   Imaging Results:    CT ABDOMEN PELVIS W CONTRAST  Result Date: 07/20/2021 CLINICAL DATA:  Abdominal pain, post-op. Status post cholecystectomy and intra-abdominal abscess drainage peer EXAM: CT ABDOMEN AND PELVIS WITH CONTRAST TECHNIQUE: Multidetector CT imaging of the abdomen  and pelvis was performed using the standard protocol following bolus administration of intravenous contrast. RADIATION DOSE REDUCTION: This exam was performed according to the departmental dose-optimization program which includes automated exposure control, adjustment of the mA and/or kV according to patient size and/or use of iterative reconstruction technique. CONTRAST:  160m OMNIPAQUE IOHEXOL 300 MG/ML  SOLN COMPARISON:  03/02/2021 FINDINGS: Lower chest: No acute abnormality. Hepatobiliary: No focal liver abnormality is seen. Status post cholecystectomy. No biliary dilatation. Pancreas: Previously noted bilobed intrapancreatic/peripancreatic loculated fluid collection has been largely evacuated in the interval since prior examination. Residual 2.5 cm loculated collection is seen adjacent to the gastric fundus at axial image # 17/2. There is infiltration along the lesser curvature of the  stomach which may be residual or postsurgical in nature without discrete loculated fluid collection identified. There is mild peripancreatic inflammatory changes involving the head and proximal body of the pancreas which, again, may be postsurgical in nature or related to changes of mild superimposed acute or subacute pancreatitis. There is discontinuity involving the Hance mint of the mid body of the pancreas suggesting focal pancreatic necrosis in this location, best seen on image # 31/2. The pancreatic duct is not dilated. No loculated peripancreatic fluid collections are identified. Spleen: Unremarkable Adrenals/Urinary Tract: Adrenal glands are unremarkable. Kidneys are normal, without renal calculi, focal lesion, or hydronephrosis. Bladder is unremarkable. Stomach/Bowel: Mild circumferential thickening of the mid and distal body of the stomach relate to the adjacent inflammatory changes involving the pancreas, may be postsurgical in nature, or reflect superimposed infectious or inflammatory gastritis. No evidence of  obstruction or perforation, however. The small bowel is unremarkable. Appendix normal. Severe descending and sigmoid colonic diverticulosis without superimposed acute inflammatory change in this location. Trace free intraperitoneal fluid, similar to prior examination. No free intraperitoneal gas. No evidence of obstruction. Vascular/Lymphatic: There is marked narrowing of the a portosplenic confluence best appreciated axial image # 27-29/2 and coronal image # 35/5. The portal vein is patent. The abdominal vasculature is otherwise unremarkable. No pathologic adenopathy within the abdomen and pelvis. Reproductive: Benign calcified fibroid within the uterus. The pelvic organs are otherwise unremarkable. Other: No abdominal wall hernia. Musculoskeletal: No acute bone abnormality. Degenerative changes are seen within the lumbar spine. IMPRESSION: 1. Interval near complete evacuation of a bilobed intrapancreatic/peripancreatic fluid collection. Residual 2.5 cm loculated fluid collection is seen adjacent to the gastric fundus. 2. Mild peripancreatic inflammatory changes involving the head and proximal body of the pancreas, possibly postsurgical in nature or related to mild superimposed acute or subacute pancreatitis. Focal discontinuity involving the mid body of the pancreas suggesting focal pancreatic necrosis. 3. Mild circumferential thickening of the mid and distal body of the stomach, possibly postsurgical in nature, related to adjacent inflammatory process involving the pancreas, or reflecting superimposed infectious or inflammatory gastritis. No evidence of obstruction or perforation. 4. Severe descending and sigmoid colonic diverticulosis without superimposed acute inflammatory change. 5. Persistent marked narrowing of the portosplenic confluence. The portal vein remains patent. Electronically Signed   By: Fidela Salisbury M.D.   On: 07/20/2021 03:26    Radiological Exams on Admission: CT ABDOMEN PELVIS W  CONTRAST  Result Date: 07/20/2021 CLINICAL DATA:  Abdominal pain, post-op. Status post cholecystectomy and intra-abdominal abscess drainage peer EXAM: CT ABDOMEN AND PELVIS WITH CONTRAST TECHNIQUE: Multidetector CT imaging of the abdomen and pelvis was performed using the standard protocol following bolus administration of intravenous contrast. RADIATION DOSE REDUCTION: This exam was performed according to the departmental dose-optimization program which includes automated exposure control, adjustment of the mA and/or kV according to patient size and/or use of iterative reconstruction technique. CONTRAST:  113m OMNIPAQUE IOHEXOL 300 MG/ML  SOLN COMPARISON:  03/02/2021 FINDINGS: Lower chest: No acute abnormality. Hepatobiliary: No focal liver abnormality is seen. Status post cholecystectomy. No biliary dilatation. Pancreas: Previously noted bilobed intrapancreatic/peripancreatic loculated fluid collection has been largely evacuated in the interval since prior examination. Residual 2.5 cm loculated collection is seen adjacent to the gastric fundus at axial image # 17/2. There is infiltration along the lesser curvature of the stomach which may be residual or postsurgical in nature without discrete loculated fluid collection identified. There is mild peripancreatic inflammatory changes involving the head and proximal body of the pancreas which,  again, may be postsurgical in nature or related to changes of mild superimposed acute or subacute pancreatitis. There is discontinuity involving the Hance mint of the mid body of the pancreas suggesting focal pancreatic necrosis in this location, best seen on image # 31/2. The pancreatic duct is not dilated. No loculated peripancreatic fluid collections are identified. Spleen: Unremarkable Adrenals/Urinary Tract: Adrenal glands are unremarkable. Kidneys are normal, without renal calculi, focal lesion, or hydronephrosis. Bladder is unremarkable. Stomach/Bowel: Mild  circumferential thickening of the mid and distal body of the stomach relate to the adjacent inflammatory changes involving the pancreas, may be postsurgical in nature, or reflect superimposed infectious or inflammatory gastritis. No evidence of obstruction or perforation, however. The small bowel is unremarkable. Appendix normal. Severe descending and sigmoid colonic diverticulosis without superimposed acute inflammatory change in this location. Trace free intraperitoneal fluid, similar to prior examination. No free intraperitoneal gas. No evidence of obstruction. Vascular/Lymphatic: There is marked narrowing of the a portosplenic confluence best appreciated axial image # 27-29/2 and coronal image # 35/5. The portal vein is patent. The abdominal vasculature is otherwise unremarkable. No pathologic adenopathy within the abdomen and pelvis. Reproductive: Benign calcified fibroid within the uterus. The pelvic organs are otherwise unremarkable. Other: No abdominal wall hernia. Musculoskeletal: No acute bone abnormality. Degenerative changes are seen within the lumbar spine. IMPRESSION: 1. Interval near complete evacuation of a bilobed intrapancreatic/peripancreatic fluid collection. Residual 2.5 cm loculated fluid collection is seen adjacent to the gastric fundus. 2. Mild peripancreatic inflammatory changes involving the head and proximal body of the pancreas, possibly postsurgical in nature or related to mild superimposed acute or subacute pancreatitis. Focal discontinuity involving the mid body of the pancreas suggesting focal pancreatic necrosis. 3. Mild circumferential thickening of the mid and distal body of the stomach, possibly postsurgical in nature, related to adjacent inflammatory process involving the pancreas, or reflecting superimposed infectious or inflammatory gastritis. No evidence of obstruction or perforation. 4. Severe descending and sigmoid colonic diverticulosis without superimposed acute  inflammatory change. 5. Persistent marked narrowing of the portosplenic confluence. The portal vein remains patent. Electronically Signed   By: Fidela Salisbury M.D.   On: 07/20/2021 03:26    DVT Prophylaxis -SCD /heparin AM Labs Ordered, also please review Full Orders  Family Communication: Admission, patients condition and plan of care including tests being ordered have been discussed with the patient  who indicate understanding and agree with the plan   Condition  -stable  Roxan Hockey M.D on 07/20/2021 at 5:08 PM Go to [email or web address] -  for contact info  [facility] - Office  [phone]

## 2021-07-21 DIAGNOSIS — D649 Anemia, unspecified: Secondary | ICD-10-CM

## 2021-07-21 DIAGNOSIS — K8581 Other acute pancreatitis with uninfected necrosis: Secondary | ICD-10-CM

## 2021-07-21 DIAGNOSIS — I10 Essential (primary) hypertension: Secondary | ICD-10-CM

## 2021-07-21 DIAGNOSIS — K8500 Idiopathic acute pancreatitis without necrosis or infection: Secondary | ICD-10-CM | POA: Diagnosis not present

## 2021-07-21 LAB — GLUCOSE, CAPILLARY
Glucose-Capillary: 87 mg/dL (ref 70–99)
Glucose-Capillary: 87 mg/dL (ref 70–99)
Glucose-Capillary: 90 mg/dL (ref 70–99)
Glucose-Capillary: 93 mg/dL (ref 70–99)
Glucose-Capillary: 95 mg/dL (ref 70–99)

## 2021-07-21 LAB — COMPREHENSIVE METABOLIC PANEL
ALT: 8 U/L (ref 0–44)
AST: 10 U/L — ABNORMAL LOW (ref 15–41)
Albumin: 2.3 g/dL — ABNORMAL LOW (ref 3.5–5.0)
Alkaline Phosphatase: 69 U/L (ref 38–126)
Anion gap: 5 (ref 5–15)
BUN: 6 mg/dL — ABNORMAL LOW (ref 8–23)
CO2: 28 mmol/L (ref 22–32)
Calcium: 8.3 mg/dL — ABNORMAL LOW (ref 8.9–10.3)
Chloride: 107 mmol/L (ref 98–111)
Creatinine, Ser: 0.74 mg/dL (ref 0.44–1.00)
GFR, Estimated: >60 mL/min (ref >60)
Glucose, Bld: 89 mg/dL (ref 70–99)
Potassium: 4.3 mmol/L (ref 3.5–5.1)
Sodium: 140 mmol/L (ref 135–145)
Total Bilirubin: 0.6 mg/dL (ref 0.3–1.2)
Total Protein: 5.7 g/dL — ABNORMAL LOW (ref 6.5–8.1)

## 2021-07-21 MED ORDER — PANCRELIPASE (LIP-PROT-AMYL) 12000-38000 UNITS PO CPEP
36000.0000 [IU] | ORAL_CAPSULE | Freq: Three times a day (TID) | ORAL | Status: DC
  Administered 2021-07-21 – 2021-07-24 (×9): 36000 [IU] via ORAL
  Filled 2021-07-21 (×9): qty 3

## 2021-07-21 NOTE — Progress Notes (Signed)
PROGRESS NOTE     Amber Church, is a 66 y.o. female, DOB - [birth year], UDJ:[id number]  Admit date - 07/20/2021   Admitting Physician Bernadette Hoit, DO  Outpatient Primary MD for the patient is Denita Lung, MD  LOS - 1  Chief Complaint  Patient presents with   Abdominal Pain        Brief Narrative:  65 y.o. female with past medical history relevant for hypertension, GERD and HLD who had lap chole on 05/18/2021 due to acute cholecystitis with gallstone pancreatitis, subsequently developed pancreatic pseudocyst and necrotizing pancreatitis--she was transferred to [facility] on 06/03/2021 where she underwent EGD with transmural drainage of pseudocyst with stent placement, as well as dilatation of gastric outlet on 06/06/2021 , She has had subsequent necrosectomy on 06/11/21 and 06/20/21 She also underwent  EUS guided AXIOS with cystgastrostomy stent removal on 07/06/2021 at [facility] -Patient was admitted on 07/20/2021 with flareup of her pancreatitis    -Assessment and Plan: 1)Acute Pancreatitis- -Extensive biliary/pancreatic history recently:- had lap chole on 05/18/2021 due to acute cholecystitis with gallstone pancreatitis, subsequently developed pancreatic pseudocyst and necrotizing pancreatitis--she was transferred to [facility] on 06/03/2021 where she underwent EGD with transmural drainage of pseudocyst with stent placement, as well as dilatation of gastric outlet on 06/06/2021 , She has had subsequent necrosectomy on 06/11/21 and 06/20/21 She also underwent  EUS guided AXIOS with cystgastrostomy stent removal on 07/06/2021 at [facility] --Lipase is 345 up from 56 on 06/02/2021 -WBC is 14.4 >>8.1 -LFTs WNL -CT abdomen and pelvis consistent with mild acute/subacute pancreatitis with evidence of focal pancreatic necrosis -Clear liquids -IV fluids -Antiemetics and IV pain medications -Discussed with GI attending Dr. Gala Romney   2) acute on chronic  Anemia-- -Hgb down to  8.3 from 10.8 on admission suspect some component of hemodilution with IV fluids  -No obvious bleeding noted   3)GERD--continue PPI   4)HLD--- stable, LFTs are not elevated, continue pravastatin   5)HTN--BP stable, hold lisinopril HCTZ at this time -May use IV hydralazine as needed   Disposition/Need for in-Hospital Stay- patient unable to be discharged at this time due to -acute pancreatitis requiring IV pain control and IV fluids   Status is: Inpatient   Remains inpatient appropriate because:    Dispo: The patient is from: Home              Anticipated d/c is to: Home              Anticipated d/c date is: 2 days              Patient currently is not medically stable to d/c. Barriers: Not Clinically Stable  Code Status :  -  Code Status: Full Code   Family Communication:    NA (patient is alert, awake and coherent)   DVT Prophylaxis  :   - SCDs  heparin injection 5,000 Units Start: 07/20/21 0830 SCDs Start: 07/20/21 0821 Place TED hose Start: 07/20/21 0821   Lab Results  Component Value Date   PLT 336 07/21/2021    Inpatient Medications  Scheduled Meds:  heparin  5,000 Units Subcutaneous Q8H   pantoprazole (PROTONIX) IV  40 mg Intravenous Daily   pravastatin  40 mg Oral q1800   sodium chloride flush  3 mL Intravenous Q12H   sodium chloride flush  3 mL Intravenous Q12H   Continuous Infusions:  sodium chloride     lactated ringers 125 mL/hr at 07/20/21 1523   PRN Meds:.sodium  chloride, acetaminophen **OR** acetaminophen, bisacodyl, fentaNYL (SUBLIMAZE) injection, hydrALAZINE, ondansetron **OR** ondansetron (ZOFRAN) IV, oxyCODONE, polyethylene glycol, sodium chloride flush, traZODone   Anti-infectives (From admission, onward)    None         Subjective: Amber Church today has no fevers, No chest pain,   -Nausea but no emesis -Tolerating clear liquid diet -Abdominal pain is not worse   Objective: Vitals:   07/20/21 1805 07/20/21 1944 07/20/21 2115  07/21/21 0454  BP: 98/65 111/72 110/78 104/77  Pulse: 87 88 89 77  Resp: '19  19 18  '$ Temp: 98.9 F (37.2 C)  98.1 F (36.7 C) 99.4 F (37.4 C)  TempSrc: Oral  Oral   SpO2: 94%  94% 99%  Weight:      Height:        Intake/Output Summary (Last 24 hours) at 07/21/2021 1047 Last data filed at 07/21/2021 0900 Gross per 24 hour  Intake 2251.26 ml  Output 1101 ml  Net 1150.26 ml   Filed Weights   07/20/21 0048 07/20/21 0919  Weight: 67.4 kg 70.4 kg    Physical Exam  Gen:- Awake Alert,  in no apparent distress  HEENT:- [place].AT, No sclera icterus Neck-Supple Neck,No JVD,.  Lungs-  CTAB , fair symmetrical air movement CV- S1, S2 normal, regular  Abd-  +ve B.Sounds, Abd Soft, improving tenderness,  no rebound or gaurding Extremity/Skin:- No  edema, pedal pulses present  Psych-affect is appropriate, oriented x3 Neuro-no new focal deficits, no tremors  Data Reviewed: I have personally reviewed following labs and imaging studies  CBC: Recent Labs  Lab 07/20/21 0120 07/21/21 0541  WBC 14.4* 8.1  HGB 10.8* 8.3*  HCT 34.2* 26.6*  MCV 87.2 88.4  PLT 534* 073   Basic Metabolic Panel: Recent Labs  Lab 07/20/21 0120 07/21/21 0541  NA 135 140  K 3.5 4.3  CL 100 107  CO2 24 28  GLUCOSE 102* 89  BUN 14 6*  CREATININE 0.82 0.74  CALCIUM 8.5* 8.3*   GFR: Estimated Creatinine Clearance: 66 mL/min (by C-G formula based on SCr of 0.74 mg/dL). Liver Function Tests: Recent Labs  Lab 07/20/21 0120 07/21/21 0541  AST 14* 10*  ALT 11 8  ALKPHOS 88 69  BILITOT 0.5 0.6  PROT 7.7 5.7*  ALBUMIN 3.3* 2.3*   Cardiac Enzymes: No results for input(s): "CKTOTAL", "CKMB", "CKMBINDEX", "TROPONINI" in the last 168 hours. BNP (last 3 results) No results for input(s): "PROBNP" in the last 8760 hours. HbA1C: No results for input(s): "HGBA1C" in the last 72 hours. Sepsis Labs: '@LABRCNTIP'$ (procalcitonin:4,lacticidven:4) )No results found for this or any previous visit (from the past 240  hour(s)).    Radiology Studies: CT ABDOMEN PELVIS W CONTRAST  Result Date: 07/20/2021 CLINICAL DATA:  Abdominal pain, post-op. Status post cholecystectomy and intra-abdominal abscess drainage peer EXAM: CT ABDOMEN AND PELVIS WITH CONTRAST TECHNIQUE: Multidetector CT imaging of the abdomen and pelvis was performed using the standard protocol following bolus administration of intravenous contrast. RADIATION DOSE REDUCTION: This exam was performed according to the departmental dose-optimization program which includes automated exposure control, adjustment of the mA and/or kV according to patient size and/or use of iterative reconstruction technique. CONTRAST:  165m OMNIPAQUE IOHEXOL 300 MG/ML  SOLN COMPARISON:  03/02/2021 FINDINGS: Lower chest: No acute abnormality. Hepatobiliary: No focal liver abnormality is seen. Status post cholecystectomy. No biliary dilatation. Pancreas: Previously noted bilobed intrapancreatic/peripancreatic loculated fluid collection has been largely evacuated in the interval since prior examination. Residual 2.5 cm loculated collection is seen  adjacent to the gastric fundus at axial image # 17/2. There is infiltration along the lesser curvature of the stomach which may be residual or postsurgical in nature without discrete loculated fluid collection identified. There is mild peripancreatic inflammatory changes involving the head and proximal body of the pancreas which, again, may be postsurgical in nature or related to changes of mild superimposed acute or subacute pancreatitis. There is discontinuity involving the Hance mint of the mid body of the pancreas suggesting focal pancreatic necrosis in this location, best seen on image # 31/2. The pancreatic duct is not dilated. No loculated peripancreatic fluid collections are identified. Spleen: Unremarkable Adrenals/Urinary Tract: Adrenal glands are unremarkable. Kidneys are normal, without renal calculi, focal lesion, or hydronephrosis.  Bladder is unremarkable. Stomach/Bowel: Mild circumferential thickening of the mid and distal body of the stomach relate to the adjacent inflammatory changes involving the pancreas, may be postsurgical in nature, or reflect superimposed infectious or inflammatory gastritis. No evidence of obstruction or perforation, however. The small bowel is unremarkable. Appendix normal. Severe descending and sigmoid colonic diverticulosis without superimposed acute inflammatory change in this location. Trace free intraperitoneal fluid, similar to prior examination. No free intraperitoneal gas. No evidence of obstruction. Vascular/Lymphatic: There is marked narrowing of the a portosplenic confluence best appreciated axial image # 27-29/2 and coronal image # 35/5. The portal vein is patent. The abdominal vasculature is otherwise unremarkable. No pathologic adenopathy within the abdomen and pelvis. Reproductive: Benign calcified fibroid within the uterus. The pelvic organs are otherwise unremarkable. Other: No abdominal wall hernia. Musculoskeletal: No acute bone abnormality. Degenerative changes are seen within the lumbar spine. IMPRESSION: 1. Interval near complete evacuation of a bilobed intrapancreatic/peripancreatic fluid collection. Residual 2.5 cm loculated fluid collection is seen adjacent to the gastric fundus. 2. Mild peripancreatic inflammatory changes involving the head and proximal body of the pancreas, possibly postsurgical in nature or related to mild superimposed acute or subacute pancreatitis. Focal discontinuity involving the mid body of the pancreas suggesting focal pancreatic necrosis. 3. Mild circumferential thickening of the mid and distal body of the stomach, possibly postsurgical in nature, related to adjacent inflammatory process involving the pancreas, or reflecting superimposed infectious or inflammatory gastritis. No evidence of obstruction or perforation. 4. Severe descending and sigmoid colonic  diverticulosis without superimposed acute inflammatory change. 5. Persistent marked narrowing of the portosplenic confluence. The portal vein remains patent. Electronically Signed   By: Fidela Salisbury M.D.   On: 07/20/2021 03:26    Scheduled Meds:  heparin  5,000 Units Subcutaneous Q8H   pantoprazole (PROTONIX) IV  40 mg Intravenous Daily   pravastatin  40 mg Oral q1800   sodium chloride flush  3 mL Intravenous Q12H   sodium chloride flush  3 mL Intravenous Q12H   Continuous Infusions:  sodium chloride     lactated ringers 125 mL/hr at 07/20/21 1523    LOS: 1 day   Roxan Hockey M.D on 07/21/2021 at 10:47 AM  Go to [email or web address] - for contact info  [facility] - Office  [phone]  If 7PM-7AM, please contact night-coverage [email or web address] 07/21/2021, 10:47 AM

## 2021-07-21 NOTE — Consult Note (Signed)
Referring Provider: No ref. provider found Primary Care Physician:  Denita Lung, MD Primary Gastroenterologist:  Dr.Rhealynn Myhre  Reason for Consultation: Recurrent pancreatitis.  HPI: Very pleasant 66 year old lady who underwent laparoscopic cholecystectomy 05/19/21 -postop course complicated by necrotizing pancreatitis ultimately requiring referred to [facility] where she underwent EUS cyst gastrotomy,  Axios placement with double pigtail catheters and endoscopic necrosectomy x2. She weathered these interventions well and clinically improved.  Last imaging at [facility] revealed a small less than 3 cm nondrainable fluid collection along the gastric fundus. She is scheduled to follow-up there in 6 months  I was asked to see her today because of a 2-day history of recurrent epigastric left-sided abdominal pain reminiscent of her recent acute pancreatitis.  She describes it as initially "11/10" in severity. Seen in ED.  White count 14 4 yesterday; today 8.1 hemoglobin down from 10.8 to 8.3.  Serum lipase 345 yesterday.  Renal function has remained normal.  LFTs not elevated.  Contrast CT this admission demonstrated overall significant improvement over that seen on her prior imaging study as far as early complete evacuation of necrotic tissue. Some peripancreatic inflammatory changes in the area of the head and body of the pancreas.  Persisting, loculated area about 2.5 cm adjacent to the gastric fundus. Suggestion of discontinuity of the mid body of the pancreas suggesting a focal focus of necrosis.  Today her pain is much improved with IV fentanyl.  She rates it 2 out of 10.  No fever. She does not have an elevated white count.  She is not tachycardic.  She is tolerating sips of clear liquids.    Past Medical History:  Diagnosis Date   Abnormal EKG    Abnormal pap    s/p cryotherapy   Anxiety    Arthritis    Depression    Diverticulosis    GERD (gastroesophageal reflux  disease)    Hyperlipidemia    Hypertension    S/P colonoscopic polypectomy     Past Surgical History:  Procedure Laterality Date   CHOLECYSTECTOMY N/A 05/18/2021   Procedure: LAPAROSCOPIC CHOLECYSTECTOMY;  Surgeon: Rusty Aus, DO;  Location: [facility];  Service: General;  Laterality: N/A;   COLONOSCOPY  03/18/11; 2000   Dr. Amedeo Plenty; diverticulosis in sigmoid colon   crysurgery  age 3   for abnormal pap   LAMINECTOMY  03/2002   TOTAL KNEE ARTHROPLASTY Right 10/02/2018   TOTAL KNEE ARTHROPLASTY Right 10/02/2018   Procedure: RIGHT TOTAL KNEE ARTHROPLASTY;  Surgeon: Newt Minion, MD;  Location: [place];  Service: Orthopedics;  Laterality: Right;    Prior to Admission medications   Medication Sig Start Date End Date Taking? Authorizing Provider  acetaminophen (TYLENOL) 650 MG CR tablet Take 650 mg by mouth every 8 (eight) hours as needed for pain or fever.   Yes [provider]  lisinopril-hydrochlorothiazide (ZESTORETIC) 10-12.5 MG tablet Take 1 tablet by mouth daily. 11/22/20  Yes [provider]  pantoprazole (PROTONIX) 40 MG tablet Take 40 mg by mouth daily. 06/08/21 06/08/22 Yes [provider]  pravastatin (PRAVACHOL) 40 MG tablet Take 40 mg by mouth daily. 11/22/20  Yes [provider]  ciprofloxacin (CIPRO) 500 MG tablet SMARTSIG:1 Tablet(s) By Mouth Every 12 Hours Patient not taking: Reported on 07/17/2021 06/09/21   [provider]  ondansetron (ZOFRAN) 4 MG tablet Take 1 tablet (4 mg total) by mouth every 8 (eight) hours as needed for nausea or vomiting. Patient not taking: Reported on 07/20/2021 06/18/21  Denita Lung, MD  oxyCODONE (OXY IR/ROXICODONE) 5 MG immediate release tablet Take 5 mg by mouth every 6 (six) hours as needed. Patient not taking: Reported on 07/17/2021 06/09/21   [provider]    Current Facility-Administered Medications  Medication Dose Route Frequency Provider Last Rate Last Admin   0.9 %  sodium  chloride infusion   Intravenous PRN Denton Brick, Courage, MD       acetaminophen (TYLENOL) tablet 650 mg  650 mg Oral Q6H PRN Emokpae, Courage, MD       Or   acetaminophen (TYLENOL) suppository 650 mg  650 mg Rectal Q6H PRN Emokpae, Courage, MD       bisacodyl (DULCOLAX) suppository 10 mg  10 mg Rectal Daily PRN Emokpae, Courage, MD       fentaNYL (SUBLIMAZE) injection 50 mcg  50 mcg Intravenous Q2H PRN Denton Brick, Courage, MD   50 mcg at 07/21/21 0513   heparin injection 5,000 Units  5,000 Units Subcutaneous Q8H Emokpae, Courage, MD   5,000 Units at 07/21/21 0505   hydrALAZINE (APRESOLINE) injection 10 mg  10 mg Intravenous Q6H PRN Roxan Hockey, MD       lactated ringers infusion   Intravenous Continuous Denton Brick, Courage, MD 125 mL/hr at 07/20/21 1523 Infusion Verify at 07/20/21 1523   ondansetron (ZOFRAN) tablet 4 mg  4 mg Oral Q6H PRN Emokpae, Courage, MD       Or   ondansetron (ZOFRAN) injection 4 mg  4 mg Intravenous Q6H PRN Emokpae, Courage, MD       oxyCODONE (Oxy IR/ROXICODONE) immediate release tablet 5 mg  5 mg Oral Q6H PRN Denton Brick, Courage, MD   5 mg at 07/21/21 0831   pantoprazole (PROTONIX) injection 40 mg  40 mg Intravenous Daily Emokpae, Courage, MD   40 mg at 07/21/21 0828   polyethylene glycol (MIRALAX / GLYCOLAX) packet 17 g  17 g Oral Daily PRN Emokpae, Courage, MD       pravastatin (PRAVACHOL) tablet 40 mg  40 mg Oral q1800 Emokpae, Courage, MD   40 mg at 07/20/21 1707   sodium chloride flush (NS) 0.9 % injection 3 mL  3 mL Intravenous Q12H Emokpae, Courage, MD   3 mL at 07/20/21 2255   sodium chloride flush (NS) 0.9 % injection 3 mL  3 mL Intravenous Q12H Emokpae, Courage, MD   3 mL at 07/20/21 2255   sodium chloride flush (NS) 0.9 % injection 3 mL  3 mL Intravenous PRN Emokpae, Courage, MD       traZODone (DESYREL) tablet 50 mg  50 mg Oral QHS PRN Roxan Hockey, MD        Allergies as of 07/19/2021   (No Known Allergies)    Family History  Problem Relation Age of  Onset   Alzheimer's disease Mother    Hyperlipidemia Mother    Other Father        died of gunshot wound   Osteoporosis Sister    Osteoporosis Sister    ALS Maternal Aunt    ALS Maternal Uncle    Diabetes Paternal Aunt    Cancer Maternal Grandfather        lung cancer   Cancer Paternal Grandmother        ?type   Heart disease Neg Hx     Social History   Socioeconomic History   Marital status: Married    Spouse name: Not on file   Number of children: 1   Years of education: Not on file  Highest education level: Not on file  Occupational History   Occupation: quality control    Employer: [employer]  Tobacco Use   Smoking status: Never   Smokeless tobacco: Never  Vaping Use   Vaping Use: Never used  Substance and Sexual Activity   Alcohol use: Yes    Comment: glass of wine 1-2 times per week.   Drug use: No   Sexual activity: Yes    Partners: Male    Comment: postmenopausal  Other Topics Concern   Not on file  Social History Narrative   Lives with husband and daughter Chiropractor), 1 dog, 1 cat   Social Determinants of Health   Financial Resource Strain: Not on file  Food Insecurity: Not on file  Transportation Needs: Not on file  Physical Activity: Not on file  Stress: Not on file  Social Connections: Not on file  Intimate Partner Violence: Not on file    Review of Systems:  As in history of present illness  Physical Exam: Vital signs in last 24 hours: Temp:  [98.1 F (36.7 C)-99.4 F (37.4 C)] 99.4 F (37.4 C) (07/15 0454) Pulse Rate:  [77-89] 77 (07/15 0454) Resp:  [18-20] 18 (07/15 0454) BP: (97-114)/(65-78) 104/77 (07/15 0454) SpO2:  [94 %-99 %] 99 % (07/15 0454) Last BM Date : 07/20/21 General:   Alert,  Well-developed, well-nourished, pleasant and cooperative in NAD Neck:  Supple; no masses or thyromegaly. Lungs:  Clear throughout to auscultation.   No wheezes, crackles, or rhonchi. No acute distress. Heart:  Regular rate and  rhythm; no murmurs, clicks, rubs,  or gallops. Abdomen: Nondistended.  Positive bowel sounds soft with minimal periumbilical and left mid abdominal tenderness to palpation.  No appreciable mass organomegaly   Intake/Output from previous day: 07/14 0701 - 07/15 0700 In: 2071.3 [P.O.:840; I.V.:1231.3] Out: 601 [Urine:600; Stool:1] Intake/Output this shift: No intake/output data recorded.  Lab Results: Recent Labs    07/20/21 0120 07/21/21 0541  WBC 14.4* 8.1  HGB 10.8* 8.3*  HCT 34.2* 26.6*  PLT 534* 336   BMET Recent Labs    07/20/21 0120 07/21/21 0541  NA 135 140  K 3.5 4.3  CL 100 107  CO2 24 28  GLUCOSE 102* 89  BUN 14 6*  CREATININE 0.82 0.74  CALCIUM 8.5* 8.3*   LFT Recent Labs    07/21/21 0541  PROT 5.7*  ALBUMIN 2.3*  AST 10*  ALT 8  ALKPHOS 69  BILITOT 0.6   PT/INR Recent Labs    07/20/21 0120  LABPROT 13.0  INR 1.0   Hepatitis Panel No results for input(s): "HEPBSAG", "HCVAB", "HEPAIGM", "HEPBIGM" in the last 72 hours. C-Diff No results for input(s): "CDIFFTOX" in the last 72 hours.  Studies/Results: CT ABDOMEN PELVIS W CONTRAST  Result Date: 07/20/2021 CLINICAL DATA:  Abdominal pain, post-op. Status post cholecystectomy and intra-abdominal abscess drainage peer EXAM: CT ABDOMEN AND PELVIS WITH CONTRAST TECHNIQUE: Multidetector CT imaging of the abdomen and pelvis was performed using the standard protocol following bolus administration of intravenous contrast. RADIATION DOSE REDUCTION: This exam was performed according to the departmental dose-optimization program which includes automated exposure control, adjustment of the mA and/or kV according to patient size and/or use of iterative reconstruction technique. CONTRAST:  152m OMNIPAQUE IOHEXOL 300 MG/ML  SOLN COMPARISON:  03/02/2021 FINDINGS: Lower chest: No acute abnormality. Hepatobiliary: No focal liver abnormality is seen. Status post cholecystectomy. No biliary dilatation. Pancreas:  Previously noted bilobed intrapancreatic/peripancreatic loculated fluid collection has been largely  evacuated in the interval since prior examination. Residual 2.5 cm loculated collection is seen adjacent to the gastric fundus at axial image # 17/2. There is infiltration along the lesser curvature of the stomach which may be residual or postsurgical in nature without discrete loculated fluid collection identified. There is mild peripancreatic inflammatory changes involving the head and proximal body of the pancreas which, again, may be postsurgical in nature or related to changes of mild superimposed acute or subacute pancreatitis. There is discontinuity involving the Hance mint of the mid body of the pancreas suggesting focal pancreatic necrosis in this location, best seen on image # 31/2. The pancreatic duct is not dilated. No loculated peripancreatic fluid collections are identified. Spleen: Unremarkable Adrenals/Urinary Tract: Adrenal glands are unremarkable. Kidneys are normal, without renal calculi, focal lesion, or hydronephrosis. Bladder is unremarkable. Stomach/Bowel: Mild circumferential thickening of the mid and distal body of the stomach relate to the adjacent inflammatory changes involving the pancreas, may be postsurgical in nature, or reflect superimposed infectious or inflammatory gastritis. No evidence of obstruction or perforation, however. The small bowel is unremarkable. Appendix normal. Severe descending and sigmoid colonic diverticulosis without superimposed acute inflammatory change in this location. Trace free intraperitoneal fluid, similar to prior examination. No free intraperitoneal gas. No evidence of obstruction. Vascular/Lymphatic: There is marked narrowing of the a portosplenic confluence best appreciated axial image # 27-29/2 and coronal image # 35/5. The portal vein is patent. The abdominal vasculature is otherwise unremarkable. No pathologic adenopathy within the abdomen and pelvis.  Reproductive: Benign calcified fibroid within the uterus. The pelvic organs are otherwise unremarkable. Other: No abdominal wall hernia. Musculoskeletal: No acute bone abnormality. Degenerative changes are seen within the lumbar spine. IMPRESSION: 1. Interval near complete evacuation of a bilobed intrapancreatic/peripancreatic fluid collection. Residual 2.5 cm loculated fluid collection is seen adjacent to the gastric fundus. 2. Mild peripancreatic inflammatory changes involving the head and proximal body of the pancreas, possibly postsurgical in nature or related to mild superimposed acute or subacute pancreatitis. Focal discontinuity involving the mid body of the pancreas suggesting focal pancreatic necrosis. 3. Mild circumferential thickening of the mid and distal body of the stomach, possibly postsurgical in nature, related to adjacent inflammatory process involving the pancreas, or reflecting superimposed infectious or inflammatory gastritis. No evidence of obstruction or perforation. 4. Severe descending and sigmoid colonic diverticulosis without superimposed acute inflammatory change. 5. Persistent marked narrowing of the portosplenic confluence. The portal vein remains patent. Electronically Signed   By: Fidela Salisbury M.D.   On: 07/20/2021 03:26     Impression: Pleasant 66 year old lady with recent necrotizing pancreatitis following cholecystectomy, presumably biliary in origin, requiring transgastric decompression, endoscopic necrosectomy, with significant improvement until now.   Current CT shows changes consistent with acute pancreatitis.  Possible small necrotizing component obscuring the pancreatic duct.  Stable cyst abutting the gastric fundus. She appears clinically improved with analgesic therapy. I suspect the acute flare is part and parcel of the original insult.   Hopefully, she will recover uneventfully.  On cross-sectional imaging there is no evident evidence of abscess or biliary  obstruction.  Moreover, she now has a normal white count, no fever or tachycardia.  Good renal function-all favorable factors.  Recommendations:   Keep her on clear liquids for the time being  Add pancreatic enzyme supplements, empirically short-term along with concomitant PPI therapy.  No need to reach out to [facility] at this time.  Discussed with Dr. Joesph Fillers.  Will follow along while she is here.  Notice:  This dictation was prepared with Dragon dictation along with smaller phrase technology. Any transcriptional errors that result from this process are unintentional and may not be corrected upon review.

## 2021-07-22 DIAGNOSIS — K8592 Acute pancreatitis with infected necrosis, unspecified: Secondary | ICD-10-CM

## 2021-07-22 DIAGNOSIS — I10 Essential (primary) hypertension: Secondary | ICD-10-CM | POA: Diagnosis not present

## 2021-07-22 DIAGNOSIS — K863 Pseudocyst of pancreas: Secondary | ICD-10-CM | POA: Diagnosis not present

## 2021-07-22 LAB — GLUCOSE, CAPILLARY
Glucose-Capillary: 102 mg/dL — ABNORMAL HIGH (ref 70–99)
Glucose-Capillary: 83 mg/dL (ref 70–99)
Glucose-Capillary: 87 mg/dL (ref 70–99)
Glucose-Capillary: 92 mg/dL (ref 70–99)
Glucose-Capillary: 95 mg/dL (ref 70–99)

## 2021-07-22 NOTE — Progress Notes (Addendum)
PROGRESS NOTE     Amber Church, is a 66 y.o. female, DOB - June 11, 1955, ZOX:[id number]  Admit date - 07/20/2021   Admitting Physician Bernadette Hoit, DO  Outpatient Primary MD for the patient is Denita Lung, MD  LOS - 2  Chief Complaint  Patient presents with   Abdominal Pain        Brief Narrative:  66 y.o. female with past medical history relevant for hypertension, GERD and HLD who had lap chole on 05/18/2021 due to acute cholecystitis with gallstone pancreatitis, subsequently developed pancreatic pseudocyst and necrotizing pancreatitis--she was transferred to [facility] on 06/03/2021 where she underwent EGD with transmural drainage of pseudocyst with stent placement, as well as dilatation of gastric outlet on 06/06/2021 , She has had subsequent necrosectomy on 06/11/21 and 06/20/21 She also underwent  EUS guided AXIOS with cystgastrostomy stent removal on 07/06/2021 at [facility] -Patient was admitted on 07/20/2021 with flareup of her pancreatitis    -Assessment and Plan: 1)Acute Pancreatitis- -Extensive biliary/pancreatic history recently:- had lap chole on 05/18/2021 due to acute cholecystitis with gallstone pancreatitis, subsequently developed pancreatic pseudocyst and necrotizing pancreatitis--she was transferred to [facility] on 06/03/2021 where she underwent EGD with transmural drainage of pseudocyst with stent placement, as well as dilatation of gastric outlet on 06/06/2021 , She has had subsequent necrosectomy on 06/11/21 and 06/20/21 She also underwent  EUS guided AXIOS with cystgastrostomy stent removal on 07/06/2021 at [facility] --Lipase is 345 up from 56 on 06/02/2021 -WBC is 14.4 >>8.1 -LFTs WNL -CT abdomen and pelvis consistent with mild acute/subacute pancreatitis with evidence of focal pancreatic necrosis -IV fluids -Antiemetics and IV pain medications -Discussed with GI attending Dr. Gala Romney -Continue liquid diet for now -Continue pancreatic enzymes    2) acute on chronic  Anemia-- -Hgb down to 8.3 from 10.8 on admission suspect some component of hemodilution with IV fluids  -No obvious bleeding noted   3)GERD--continue PPI   4)HLD--- stable, LFTs are not elevated, continue pravastatin   5)HTN--BP stable, hold lisinopril HCTZ at this time -May use IV hydralazine as needed   Disposition/Need for in-Hospital Stay- patient unable to be discharged at this time due to -acute pancreatitis requiring IV pain control and IV fluids -Hold off on discharge home pending better tolerance of oral intake and better pain control   Status is: Inpatient   Remains inpatient appropriate because:    Dispo: The patient is from: Home              Anticipated d/c is to: Home              Anticipated d/c date is: 1 days              Patient currently is not medically stable to d/c. Barriers: Not Clinically Stable  Code Status :  -  Code Status: Full Code   Family Communication:    NA (patient is alert, awake and coherent)   DVT Prophylaxis  :   - SCDs  heparin injection 5,000 Units Start: 07/20/21 0830 SCDs Start: 07/20/21 0821 Place TED hose Start: 07/20/21 0821   Lab Results  Component Value Date   PLT 336 07/21/2021    Inpatient Medications  Scheduled Meds:  heparin  5,000 Units Subcutaneous Q8H   lipase/protease/amylase  36,000 Units Oral TID AC   pantoprazole (PROTONIX) IV  40 mg Intravenous Daily   pravastatin  40 mg Oral q1800   sodium chloride flush  3 mL Intravenous Q12H  sodium chloride flush  3 mL Intravenous Q12H   Continuous Infusions:  sodium chloride     lactated ringers 125 mL/hr at 07/22/21 1456   PRN Meds:.sodium chloride, acetaminophen **OR** acetaminophen, bisacodyl, fentaNYL (SUBLIMAZE) injection, hydrALAZINE, ondansetron **OR** ondansetron (ZOFRAN) IV, oxyCODONE, polyethylene glycol, sodium chloride flush, traZODone   Anti-infectives (From admission, onward)    None         Subjective: Amber Church  today has no fevers, No chest pain,   -Nausea but no emesis -Tolerating clear liquid diet -No BM   Objective: Vitals:   07/21/21 1304 07/21/21 2056 07/22/21 0439 07/22/21 1305  BP: 122/88 119/84 117/86 (!) 126/92  Pulse: 88 80 79 91  Resp: '17 20 19 17  '$ Temp: 98.4 F (36.9 C) 98.9 F (37.2 C) 98.2 F (36.8 C) 97.9 F (36.6 C)  TempSrc: Oral Oral Oral   SpO2: 96% 95% 95% 96%  Weight:      Height:        Intake/Output Summary (Last 24 hours) at 07/22/2021 1756 Last data filed at 07/22/2021 1300 Gross per 24 hour  Intake 660 ml  Output 2100 ml  Net -1440 ml   Filed Weights   07/20/21 0048 07/20/21 0919  Weight: 67.4 kg 70.4 kg    Physical Exam  Gen:- Awake Alert,  in no apparent distress  HEENT:- [place].AT, No sclera icterus Neck-Supple Neck,No JVD,.  Lungs-  CTAB , fair symmetrical air movement CV- S1, S2 normal, regular  Abd-  +ve B.Sounds, Abd Soft, improving tenderness,  no rebound or gaurding Extremity/Skin:- No  edema, pedal pulses present  Psych-affect is appropriate, oriented x3 Neuro-no new focal deficits, no tremors  Data Reviewed: I have personally reviewed following labs and imaging studies  CBC: Recent Labs  Lab 07/20/21 0120 07/21/21 0541  WBC 14.4* 8.1  HGB 10.8* 8.3*  HCT 34.2* 26.6*  MCV 87.2 88.4  PLT 534* 353   Basic Metabolic Panel: Recent Labs  Lab 07/20/21 0120 07/21/21 0541  NA 135 140  K 3.5 4.3  CL 100 107  CO2 24 28  GLUCOSE 102* 89  BUN 14 6*  CREATININE 0.82 0.74  CALCIUM 8.5* 8.3*   GFR: Estimated Creatinine Clearance: 66 mL/min (by C-G formula based on SCr of 0.74 mg/dL). Liver Function Tests: Recent Labs  Lab 07/20/21 0120 07/21/21 0541  AST 14* 10*  ALT 11 8  ALKPHOS 88 69  BILITOT 0.5 0.6  PROT 7.7 5.7*  ALBUMIN 3.3* 2.3*    Radiology Studies: No results found.  Scheduled Meds:  heparin  5,000 Units Subcutaneous Q8H   lipase/protease/amylase  36,000 Units Oral TID AC   pantoprazole (PROTONIX) IV  40  mg Intravenous Daily   pravastatin  40 mg Oral q1800   sodium chloride flush  3 mL Intravenous Q12H   sodium chloride flush  3 mL Intravenous Q12H   Continuous Infusions:  sodium chloride     lactated ringers 125 mL/hr at 07/22/21 1456    LOS: 2 days   Roxan Hockey M.D on 07/22/2021 at 5:56 PM  Go to [email or web address] - for contact info  [facility] - Office  [phone]  If 7PM-7AM, please contact night-coverage [email or web address] 07/22/2021, 5:56 PM

## 2021-07-22 NOTE — Progress Notes (Signed)
Patient says pain overall improved but rises to 4 / 10 prior to getting next dose of pain medication.  Tolerating clear liquids. Pancreatic enzymes added to her regimen yesterday.  Vital signs in last 24 hours: Temp:  [98.2 F (36.8 C)-98.9 F (37.2 C)] 98.2 F (36.8 C) (07/16 0439) Pulse Rate:  [79-88] 79 (07/16 0439) Resp:  [17-20] 19 (07/16 0439) BP: (117-122)/(84-88) 117/86 (07/16 0439) SpO2:  [95 %-96 %] 95 % (07/16 0439) Last BM Date : 07/19/21 General:   Alert,   pleasant and cooperative in NAD Abdomen: Nondistended.  Positive bowel sounds she does some mild epigastric tenderness to palpation.  Intake/Output from previous day: 07/15 0701 - 07/16 0700 In: 540 [P.O.:540] Out: 1800 [Urine:1800] Intake/Output this shift: Total I/O In: 240 [P.O.:240] Out: 800 [Urine:800]  Lab Results: Recent Labs    07/20/21 0120 07/21/21 0541  WBC 14.4* 8.1  HGB 10.8* 8.3*  HCT 34.2* 26.6*  PLT 534* 336   BMET Recent Labs    07/20/21 0120 07/21/21 0541  NA 135 140  K 3.5 4.3  CL 100 107  CO2 24 28  GLUCOSE 102* 89  BUN 14 6*  CREATININE 0.82 0.74  CALCIUM 8.5* 8.3*   LFT Recent Labs    07/21/21 0541  PROT 5.7*  ALBUMIN 2.3*  AST 10*  ALT 8  ALKPHOS 69  BILITOT 0.6   PT/INR Recent Labs    07/20/21 0120  LABPROT 13.0  INR 1.0    Impression: 66 year old lady with recent bout of complicated pancreatitis status postcholecystectomy presumed to be biliary in origin.  She has recently required transgastric Axios with pigtail stents, endoscopic necrosectomy x2 admitted with a late flare of acute pancreatitis.  She appears to have a small cyst/necrotizing component on CT scan this hospitalization.  However, overall greatly improved over that seen previously. Clinically, flare of pancreatitis has improved considerably.  She is tolerating clear liquids.  Short course pancreatic enzyme therapy added empirically yesterday. Appears to be running an uncomplicated  course.  Recommendations:  Would favor clear liquids 1 more day prior to advancing diet  Continue Creon/PPI.  Will be reassessed by her team tomorrow morning.

## 2021-07-23 DIAGNOSIS — K863 Pseudocyst of pancreas: Secondary | ICD-10-CM | POA: Diagnosis not present

## 2021-07-23 DIAGNOSIS — K8592 Acute pancreatitis with infected necrosis, unspecified: Secondary | ICD-10-CM | POA: Diagnosis not present

## 2021-07-23 DIAGNOSIS — I10 Essential (primary) hypertension: Secondary | ICD-10-CM | POA: Diagnosis not present

## 2021-07-23 LAB — CBC
HCT: 26.1 % — ABNORMAL LOW (ref 36.0–46.0)
Hemoglobin: 8.2 g/dL — ABNORMAL LOW (ref 12.0–15.0)
MCH: 27.2 pg (ref 26.0–34.0)
MCHC: 31.4 g/dL (ref 30.0–36.0)
MCV: 86.7 fL (ref 80.0–100.0)
Platelets: 335 10*3/uL (ref 150–400)
RBC: 3.01 MIL/uL — ABNORMAL LOW (ref 3.87–5.11)
RDW: 14.9 % (ref 11.5–15.5)
WBC: 5.9 10*3/uL (ref 4.0–10.5)
nRBC: 0 % (ref 0.0–0.2)

## 2021-07-23 LAB — COMPREHENSIVE METABOLIC PANEL
ALT: 8 U/L (ref 0–44)
AST: 14 U/L — ABNORMAL LOW (ref 15–41)
Albumin: 2.3 g/dL — ABNORMAL LOW (ref 3.5–5.0)
Alkaline Phosphatase: 62 U/L (ref 38–126)
Anion gap: 7 (ref 5–15)
BUN: <5 mg/dL — ABNORMAL LOW (ref 8–23)
CO2: 27 mmol/L (ref 22–32)
Calcium: 8.2 mg/dL — ABNORMAL LOW (ref 8.9–10.3)
Chloride: 107 mmol/L (ref 98–111)
Creatinine, Ser: 0.72 mg/dL (ref 0.44–1.00)
GFR, Estimated: >60 mL/min (ref >60)
Glucose, Bld: 95 mg/dL (ref 70–99)
Potassium: 3.6 mmol/L (ref 3.5–5.1)
Sodium: 141 mmol/L (ref 135–145)
Total Bilirubin: 0.7 mg/dL (ref 0.3–1.2)
Total Protein: 5.6 g/dL — ABNORMAL LOW (ref 6.5–8.1)

## 2021-07-23 LAB — GLUCOSE, CAPILLARY
Glucose-Capillary: 106 mg/dL — ABNORMAL HIGH (ref 70–99)
Glucose-Capillary: 88 mg/dL (ref 70–99)
Glucose-Capillary: 92 mg/dL (ref 70–99)
Glucose-Capillary: 98 mg/dL (ref 70–99)

## 2021-07-23 MED ORDER — SODIUM CHLORIDE 0.9 % IV SOLN: INTRAVENOUS | Status: DC

## 2021-07-23 NOTE — Progress Notes (Signed)
PROGRESS NOTE     Amber Church, is a 66 y.o. female, DOB - August 08, 1955, FYB:[id number]  Admit date - 07/20/2021   Admitting Physician Bernadette Hoit, DO  Outpatient Primary MD for the patient is Denita Lung, MD  LOS - 3  Chief Complaint  Patient presents with   Abdominal Pain        Brief Narrative:  66 y.o. female with past medical history relevant for hypertension, GERD and HLD who had lap chole on 05/18/2021 due to acute cholecystitis with gallstone pancreatitis, subsequently developed pancreatic pseudocyst and necrotizing pancreatitis--she was transferred to [facility] on 06/03/2021 where she underwent EGD with transmural drainage of pseudocyst with stent placement, as well as dilatation of gastric outlet on 06/06/2021 , She has had subsequent necrosectomy on 06/11/21 and 06/20/21 She also underwent  EUS guided AXIOS with cystgastrostomy stent removal on 07/06/2021 at [facility] -Patient was admitted on 07/20/2021 with flareup of her pancreatitis    -Assessment and Plan: 1)Acute Pancreatitis- -Extensive biliary/pancreatic history recently:- had lap chole on 05/18/2021 due to acute cholecystitis with gallstone pancreatitis, subsequently developed pancreatic pseudocyst and necrotizing pancreatitis--she was transferred to [facility] on 06/03/2021 where she underwent EGD with transmural drainage of pseudocyst with stent placement, as well as dilatation of gastric outlet on 06/06/2021 , She has had subsequent necrosectomy on 06/11/21 and 06/20/21 She also underwent  EUS guided AXIOS with cystgastrostomy stent removal on 07/06/2021 at [facility] --Lipase is 345 up from 56 on 06/02/2021 -WBC is 14.4 >>8.1 -LFTs WNL -CT abdomen and pelvis consistent with mild acute/subacute pancreatitis with evidence of focal pancreatic necrosis -IV fluids -Antiemetics and IV pain medications -Continue liquid diet for now -Continue pancreatic enzymes -- Reports some postprandial abdominal  pain after liquid diet requiring pain medications   2) acute on chronic  Anemia-- -Hgb down to 8.3 from 10.8 on admission suspect some component of hemodilution with IV fluids  -No obvious bleeding noted   3)GERD--continue PPI   4)HLD--- stable, LFTs are not elevated, continue pravastatin   5)HTN--BP stable, hold lisinopril HCTZ at this time -May use IV hydralazine as needed   Disposition/Need for in-Hospital Stay- patient unable to be discharged at this time due to -acute pancreatitis requiring IV pain control and IV fluids -Hold off on discharge home pending better tolerance of oral intake and better pain control -For now continue to have postprandial abdominal pain with liquid diet requiring pain medications -- Reports some postprandial abdominal pain after liquid diet requiring pain medications   Status is: Inpatient   Remains inpatient appropriate because:    Dispo: The patient is from: Home              Anticipated d/c is to: Home              Anticipated d/c date is: 1 days              Patient currently is not medically stable to d/c. Barriers: Not Clinically Stable  Code Status :  -  Code Status: Full Code   Family Communication:    NA (patient is alert, awake and coherent)   DVT Prophylaxis  :   - SCDs  heparin injection 5,000 Units Start: 07/20/21 0830 SCDs Start: 07/20/21 0821 Place TED hose Start: 07/20/21 0821   Lab Results  Component Value Date   PLT 335 07/23/2021    Inpatient Medications  Scheduled Meds:  heparin  5,000 Units Subcutaneous Q8H   lipase/protease/amylase  36,000 Units Oral  TID AC   pantoprazole (PROTONIX) IV  40 mg Intravenous Daily   pravastatin  40 mg Oral q1800   sodium chloride flush  3 mL Intravenous Q12H   sodium chloride flush  3 mL Intravenous Q12H   Continuous Infusions:  sodium chloride     lactated ringers 125 mL/hr at 07/23/21 0732   PRN Meds:.sodium chloride, acetaminophen **OR** acetaminophen, bisacodyl, fentaNYL  (SUBLIMAZE) injection, hydrALAZINE, ondansetron **OR** ondansetron (ZOFRAN) IV, oxyCODONE, polyethylene glycol, sodium chloride flush, traZODone   Anti-infectives (From admission, onward)    None       Subjective: Amber Church today has no fevers, No chest pain,   -- Reports some postprandial abdominal pain after liquid diet requiring pain medications   Objective: Vitals:   07/22/21 1305 07/22/21 2108 07/23/21 0619 07/23/21 1349  BP: (!) 126/92 129/86 (!) 137/96 130/90  Pulse: 91 82 74 75  Resp: '17 18 18 16  '$ Temp: 97.9 F (36.6 C) 98.2 F (36.8 C) 98.7 F (37.1 C) 98.1 F (36.7 C)  TempSrc:  Oral Oral   SpO2: 96% 96% 96% 96%  Weight:      Height:        Intake/Output Summary (Last 24 hours) at 07/23/2021 2030 Last data filed at 07/23/2021 1842 Gross per 24 hour  Intake 540 ml  Output 750 ml  Net -210 ml   Filed Weights   07/20/21 0048 07/20/21 0919  Weight: 67.4 kg 70.4 kg    Physical Exam  Gen:- Awake Alert,  in no apparent distress  HEENT:- [place].AT, No sclera icterus Neck-Supple Neck,No JVD,.  Lungs-  CTAB , fair symmetrical air movement CV- S1, S2 normal, regular  Abd-  +ve B.Sounds, Abd Soft, improving tenderness,  no rebound or gaurding Extremity/Skin:- No  edema, pedal pulses present  Psych-affect is appropriate, oriented x3 Neuro-no new focal deficits, no tremors  Data Reviewed: I have personally reviewed following labs and imaging studies  CBC: Recent Labs  Lab 07/20/21 0120 07/21/21 0541 07/23/21 0441  WBC 14.4* 8.1 5.9  HGB 10.8* 8.3* 8.2*  HCT 34.2* 26.6* 26.1*  MCV 87.2 88.4 86.7  PLT 534* 336 160   Basic Metabolic Panel: Recent Labs  Lab 07/20/21 0120 07/21/21 0541 07/23/21 0441  NA 135 140 141  K 3.5 4.3 3.6  CL 100 107 107  CO2 '24 28 27  '$ GLUCOSE 102* 89 95  BUN 14 6* <5*  CREATININE 0.82 0.74 0.72  CALCIUM 8.5* 8.3* 8.2*   GFR: Estimated Creatinine Clearance: 66 mL/min (by C-G formula based on SCr of 0.72  mg/dL). Liver Function Tests: Recent Labs  Lab 07/20/21 0120 07/21/21 0541 07/23/21 0441  AST 14* 10* 14*  ALT '11 8 8  '$ ALKPHOS 88 69 62  BILITOT 0.5 0.6 0.7  PROT 7.7 5.7* 5.6*  ALBUMIN 3.3* 2.3* 2.3*    Radiology Studies: No results found.  Scheduled Meds:  heparin  5,000 Units Subcutaneous Q8H   lipase/protease/amylase  36,000 Units Oral TID AC   pantoprazole (PROTONIX) IV  40 mg Intravenous Daily   pravastatin  40 mg Oral q1800   sodium chloride flush  3 mL Intravenous Q12H   sodium chloride flush  3 mL Intravenous Q12H   Continuous Infusions:  sodium chloride     lactated ringers 125 mL/hr at 07/23/21 0732    LOS: 3 days   Roxan Hockey M.D on 07/23/2021 at 8:30 PM  Go to [email or web address] - for contact info  [facility] - Office  [phone]  If  7PM-7AM, please contact night-coverage [email or web address] 07/23/2021, 8:30 PM

## 2021-07-23 NOTE — Progress Notes (Signed)
    Subjective: Feeling fairly well this morning. Reports abdominal pain is improving. Only with a mild soreness in the LUQ/epigastric area that seems to be triggered by certain movements. No postprandial symptoms.  Denies nausea or vomiting. No BM but is passing gas.   Objective: Vital signs in last 24 hours: Temp:  [97.9 F (36.6 C)-98.7 F (37.1 C)] 98.7 F (37.1 C) (07/17 0619) Pulse Rate:  [74-91] 74 (07/17 0619) Resp:  [17-18] 18 (07/17 0619) BP: (126-137)/(86-96) 137/96 (07/17 0619) SpO2:  [96 %] 96 % (07/17 0619) Last BM Date : 07/19/21 General:   Alert and oriented, pleasant Head:  Normocephalic and atraumatic. Eyes:  No icterus, sclera clear. Conjuctiva pink.  Abdomen:  Bowel sounds present, soft, non-distended. Mild TTP in LUQ and epigastric area. No HSM or hernias noted. No rebound or guarding. No masses appreciated  Msk:  Symmetrical without gross deformities. Normal posture. Extremities:  Without edema. Neurologic:  Alert and  oriented x4;  grossly normal neurologically. Psych: Normal mood and affect.  Intake/Output from previous day: 07/16 0701 - 07/17 0700 In: 660 [P.O.:660] Out: 2000 [Urine:2000] Intake/Output this shift: Total I/O In: 180 [P.O.:180] Out: -   Lab Results: Recent Labs    07/21/21 0541 07/23/21 0441  WBC 8.1 5.9  HGB 8.3* 8.2*  HCT 26.6* 26.1*  PLT 336 335   BMET Recent Labs    07/21/21 0541 07/23/21 0441  NA 140 141  K 4.3 3.6  CL 107 107  CO2 28 27  GLUCOSE 89 95  BUN 6* <5*  CREATININE 0.74 0.72  CALCIUM 8.3* 8.2*   LFT Recent Labs    07/21/21 0541 07/23/21 0441  PROT 5.7* 5.6*  ALBUMIN 2.3* 2.3*  AST 10* 14*  ALT 8 8  ALKPHOS 69 32  BILITOT 0.6 0.7    Assessment: 66 year old female with recent bout of complicated necrotizing pancreatitis post cholecystectomy, presumed biliary origin. She underwent EUS cyst gastrotomy, transgastric Axios with pigtail stents (removed on 6/30), endoscopic necrosectomy x2 at [facility],  now admitted with late flare of acute pancreatitis, possibly postsurgical in nature vs mild superimposed acute or subacute pancreatitis. She appears to have a small cyst/necrotizing component on CT scan this hospitalization.  However, overall greatly improved over that seen previously. Clinically, she has done very well with supportive measures. She is tolerating full liquid diet well without any worsening abdominal pain. Denies nausea or vomiting.  Labs are reassuring. Still requiring pain medications, but frequency of dosing is decreasing.     Plan: Advance to soft diet for dinner.  Continue PPI daily Continue Creon Continue supportive measures.    LOS: 3 days    07/23/2021, 12:53 PM   Aliene Altes, [facility]

## 2021-07-23 NOTE — Progress Notes (Signed)
Patient medicated for abdominal pain twice this shift, tolerated full liquid diet

## 2021-07-24 DIAGNOSIS — K8591 Acute pancreatitis with uninfected necrosis, unspecified: Principal | ICD-10-CM

## 2021-07-24 DIAGNOSIS — D72829 Elevated white blood cell count, unspecified: Secondary | ICD-10-CM

## 2021-07-24 LAB — CBC
HCT: 26.6 % — ABNORMAL LOW (ref 36.0–46.0)
Hemoglobin: 8.3 g/dL — ABNORMAL LOW (ref 12.0–15.0)
MCH: 27.6 pg (ref 26.0–34.0)
MCHC: 31.2 g/dL (ref 30.0–36.0)
MCV: 88.4 fL (ref 80.0–100.0)
Platelets: 336 10*3/uL (ref 150–400)
RBC: 3.01 MIL/uL — ABNORMAL LOW (ref 3.87–5.11)
RDW: 14.9 % (ref 11.5–15.5)
WBC: 8.1 10*3/uL (ref 4.0–10.5)
nRBC: 0 % (ref 0.0–0.2)

## 2021-07-24 LAB — GLUCOSE, CAPILLARY
Glucose-Capillary: 89 mg/dL (ref 70–99)
Glucose-Capillary: 115 mg/dL — ABNORMAL HIGH (ref 70–99)

## 2021-07-24 MED ORDER — PANTOPRAZOLE SODIUM 40 MG PO TBEC: 40.0000 mg | DELAYED_RELEASE_TABLET | Freq: Every day | ORAL | 11 refills | Status: DC

## 2021-07-24 MED ORDER — PANCRELIPASE (LIP-PROT-AMYL) 36000-114000 UNITS PO CPEP: 36000.0000 [IU] | ORAL_CAPSULE | Freq: Three times a day (TID) | ORAL | 2 refills | Status: DC

## 2021-07-24 MED ORDER — OXYCODONE HCL 5 MG PO TABS: 5.0000 mg | ORAL_TABLET | Freq: Four times a day (QID) | ORAL | 0 refills | Status: DC | PRN

## 2021-07-24 MED ORDER — ONDANSETRON HCL 4 MG PO TABS: 4.0000 mg | ORAL_TABLET | Freq: Three times a day (TID) | ORAL | 0 refills | Status: DC | PRN

## 2021-07-24 NOTE — Discharge Instructions (Signed)
1)Avoid ibuprofen/Advil/Aleve/Motrin/Goody Powders/Naproxen/BC powders/Meloxicam/Diclofenac/Indomethacin and other Nonsteroidal anti-inflammatory medications as these will make you more likely to bleed and can cause stomach ulcers, can also cause Kidney problems.   2)Follow up with your gastroenterologist at [facility]  3)Repeat CBC and CMP blood test in 1 to 2 weeks  4)Avoid greasy/fatty foods----soft foods advised, small frequent meals encouraged

## 2021-07-24 NOTE — Discharge Summary (Signed)
Amber Church, is a 66 y.o. female  DOB 1955/10/15  MRN [id number].  Admission date:  07/20/2021  Admitting Physician  Bernadette Hoit, DO  Discharge Date:  07/24/2021   Primary MD  Denita Lung, MD  Recommendations for primary care physician for things to follow:   1)Avoid ibuprofen/Advil/Aleve/Motrin/Goody Powders/Naproxen/BC powders/Meloxicam/Diclofenac/Indomethacin and other Nonsteroidal anti-inflammatory medications as these will make you more likely to bleed and can cause stomach ulcers, can also cause Kidney problems.   2)Follow up with your gastroenterologist at [facility]  3)Repeat CBC and CMP blood test in 1 to 2 weeks  4)Avoid greasy/fatty foods----soft foods advised, small frequent meals encouraged  Admission Diagnosis  Acute pancreatitis [K85.90] Acute pancreatitis with uninfected necrosis, unspecified pancreatitis type [K85.91]   Discharge Diagnosis  Acute pancreatitis [K85.90] Acute pancreatitis with uninfected necrosis, unspecified pancreatitis type [K85.91]    Principal Problem:   Acute pancreatitis Active Problems:   Hypertension   Hyperlipidemia LDL goal <100   Leukocytosis   Pancreatic pseudocyst   Normocytic anemia      Past Medical History:  Diagnosis Date   Abnormal EKG    Abnormal pap    s/p cryotherapy   Anxiety    Arthritis    Depression    Diverticulosis    GERD (gastroesophageal reflux disease)    Hyperlipidemia    Hypertension    S/P colonoscopic polypectomy     Past Surgical History:  Procedure Laterality Date   CHOLECYSTECTOMY N/A 05/18/2021   Procedure: LAPAROSCOPIC CHOLECYSTECTOMY;  Surgeon: Rusty Aus, DO;  Location: [facility];  Service: General;  Laterality: N/A;   COLONOSCOPY  03/18/11; 2000   Dr. Amedeo Plenty; diverticulosis in sigmoid colon   crysurgery  age 77   for abnormal pap   LAMINECTOMY  03/2002   TOTAL KNEE ARTHROPLASTY  Right 10/02/2018   TOTAL KNEE ARTHROPLASTY Right 10/02/2018   Procedure: RIGHT TOTAL KNEE ARTHROPLASTY;  Surgeon: Newt Minion, MD;  Location: [place];  Service: Orthopedics;  Laterality: Right;     HPI  from the history and physical done on the day of admission:    Amber Church  is a 66 y.o. female with past medical history relevant for hypertension, GERD and HLD who had lap chole on 05/18/2021 due to acute cholecystitis with gallstone pancreatitis, subsequently developed pancreatic pseudocyst and necrotizing pancreatitis--she was transferred to [facility] on 06/03/2021 where she underwent EGD with transmural drainage of pseudocyst with stent placement, as well as dilatation of gastric outlet on 06/06/2021 , She has had subsequent necrosectomy on 06/11/21 and 06/20/21 She also underwent  EUS guided AXIOS with cystgastrostomy stent removal on 07/06/2021 at [facility] -Now with increasing left upper quadrant pain since 8 PM on 07/19/2021 -Patient with nausea but no vomiting -No BM in the last 4 days No fever  Or chills  -Denies EtOH intake In ED--- CT abdomen and pelvis shows near complete resolution of prior pancreatic pseudocyst, there is evidence of mild acute or subacute pancreatitis, as well as evidence of focal pancreatic  necrosis -Lipase is 345 up from 56 on 06/02/2021 -WBC is 14.4  Hgb is 10.8 which is close to recent baseline -LFTs are not elevated, INR is 1.0   Hospital Course:   Brief Narrative:  66 y.o. female with past medical history relevant for hypertension, GERD and HLD who had lap chole on 05/18/2021 due to acute cholecystitis with gallstone pancreatitis, subsequently developed pancreatic pseudocyst and necrotizing pancreatitis--she was transferred to [facility] on 06/03/2021 where she underwent EGD with transmural drainage of pseudocyst with stent placement, as well as dilatation of gastric outlet on 06/06/2021 , She has had subsequent necrosectomy on 06/11/21 and 06/20/21 She also  underwent  EUS guided AXIOS with cystgastrostomy stent removal on 07/06/2021 at [facility] -Patient was admitted on 07/20/2021 with flareup of her pancreatitis     -Assessment and Plan: 1)Acute Pancreatitis- -Extensive biliary/pancreatic history recently:- had lap chole on 05/18/2021 due to acute cholecystitis with gallstone pancreatitis, subsequently developed pancreatic pseudocyst and necrotizing pancreatitis--she was transferred to [facility] on 06/03/2021 where she underwent EGD with transmural drainage of pseudocyst with stent placement, as well as dilatation of gastric outlet on 06/06/2021 , She has had subsequent necrosectomy on 06/11/21 and 06/20/21 She also underwent  EUS guided AXIOS with cystgastrostomy stent removal on 07/06/2021 at [facility] --Lipase is 345 up from 56 on 06/02/2021 -WBC is 14.4 >>8.1>>5.9 -LFTs WNL -CT abdomen and pelvis consistent with mild acute/subacute pancreatitis with evidence of focal pancreatic necrosis -IV fluids Abdominal pain and nausea improved significantly =-GI service added pancreatic enzymes -No significant postprandial abdominal pain    2) acute on chronic  Anemia-- -Hgb down to 8.2 from 10.8 on admission suspect some component of hemodilution with IV fluids  -No obvious bleeding noted   3)GERD--continue PPI   4)HLD--- stable, LFTs are not elevated, continue pravastatin   5)HTN--BP stable, okay to restart lisinopril HCTZ at this time   Disposition--Home  Dispo: The patient is from: Home              Anticipated d/c is to: Home  Discharge Condition: stable  Follow UP--- outpatient follow-up with GI advised   Consults obtained - Gi  Diet and Activity recommendation:  As advised  Discharge Instructions    Discharge Instructions     Call MD for:  difficulty breathing, headache or visual disturbances   Complete by: As directed    Call MD for:  persistant dizziness or light-headedness   Complete by: As directed     Call MD for:  persistant nausea and vomiting   Complete by: As directed    Call MD for:  severe uncontrolled pain   Complete by: As directed    Call MD for:  temperature >100.4   Complete by: As directed    Diet - low sodium heart healthy   Complete by: As directed    Discharge instructions   Complete by: As directed    1)Avoid ibuprofen/Advil/Aleve/Motrin/Goody Powders/Naproxen/BC powders/Meloxicam/Diclofenac/Indomethacin and other Nonsteroidal anti-inflammatory medications as these will make you more likely to bleed and can cause stomach ulcers, can also cause Kidney problems.   2)Follow up with your gastroenterologist at [facility]  3)Repeat CBC and CMP blood test in 1 to 2 weeks  4)Avoid greasy/fatty foods----soft foods advised, small frequent meals encouraged   Increase activity slowly   Complete by: As directed         Discharge Medications     Allergies as of 07/24/2021   No Known Allergies  Medication List     STOP taking these medications    ciprofloxacin 500 MG tablet Commonly known as: CIPRO       TAKE these medications    acetaminophen 650 MG CR tablet Commonly known as: TYLENOL Take 650 mg by mouth every 8 (eight) hours as needed for pain or fever.   lipase/protease/amylase 36000 UNITS Cpep capsule Commonly known as: CREON Take 1 capsule (36,000 Units total) by mouth 3 (three) times daily before meals.   lisinopril-hydrochlorothiazide 10-12.5 MG tablet Commonly known as: ZESTORETIC Take 1 tablet by mouth daily.   ondansetron 4 MG tablet Commonly known as: Zofran Take 1 tablet (4 mg total) by mouth every 8 (eight) hours as needed for nausea or vomiting.   oxyCODONE 5 MG immediate release tablet Commonly known as: Oxy IR/ROXICODONE Take 1 tablet (5 mg total) by mouth every 6 (six) hours as needed.   pantoprazole 40 MG tablet Commonly known as: PROTONIX Take 1 tablet (40 mg total) by mouth daily.   pravastatin 40 MG tablet Commonly  known as: PRAVACHOL Take 40 mg by mouth daily.        Major procedures and Radiology Reports - PLEASE review detailed and final reports for all details, in brief -   CT ABDOMEN PELVIS W CONTRAST  Result Date: 07/20/2021 CLINICAL DATA:  Abdominal pain, post-op. Status post cholecystectomy and intra-abdominal abscess drainage peer EXAM: CT ABDOMEN AND PELVIS WITH CONTRAST TECHNIQUE: Multidetector CT imaging of the abdomen and pelvis was performed using the standard protocol following bolus administration of intravenous contrast. RADIATION DOSE REDUCTION: This exam was performed according to the departmental dose-optimization program which includes automated exposure control, adjustment of the mA and/or kV according to patient size and/or use of iterative reconstruction technique. CONTRAST:  112m OMNIPAQUE IOHEXOL 300 MG/ML  SOLN COMPARISON:  03/02/2021 FINDINGS: Lower chest: No acute abnormality. Hepatobiliary: No focal liver abnormality is seen. Status post cholecystectomy. No biliary dilatation. Pancreas: Previously noted bilobed intrapancreatic/peripancreatic loculated fluid collection has been largely evacuated in the interval since prior examination. Residual 2.5 cm loculated collection is seen adjacent to the gastric fundus at axial image # 17/2. There is infiltration along the lesser curvature of the stomach which may be residual or postsurgical in nature without discrete loculated fluid collection identified. There is mild peripancreatic inflammatory changes involving the head and proximal body of the pancreas which, again, may be postsurgical in nature or related to changes of mild superimposed acute or subacute pancreatitis. There is discontinuity involving the Hance mint of the mid body of the pancreas suggesting focal pancreatic necrosis in this location, best seen on image # 31/2. The pancreatic duct is not dilated. No loculated peripancreatic fluid collections are identified. Spleen:  Unremarkable Adrenals/Urinary Tract: Adrenal glands are unremarkable. Kidneys are normal, without renal calculi, focal lesion, or hydronephrosis. Bladder is unremarkable. Stomach/Bowel: Mild circumferential thickening of the mid and distal body of the stomach relate to the adjacent inflammatory changes involving the pancreas, may be postsurgical in nature, or reflect superimposed infectious or inflammatory gastritis. No evidence of obstruction or perforation, however. The small bowel is unremarkable. Appendix normal. Severe descending and sigmoid colonic diverticulosis without superimposed acute inflammatory change in this location. Trace free intraperitoneal fluid, similar to prior examination. No free intraperitoneal gas. No evidence of obstruction. Vascular/Lymphatic: There is marked narrowing of the a portosplenic confluence best appreciated axial image # 27-29/2 and coronal image # 35/5. The portal vein is patent. The abdominal vasculature is otherwise unremarkable. No pathologic adenopathy within  the abdomen and pelvis. Reproductive: Benign calcified fibroid within the uterus. The pelvic organs are otherwise unremarkable. Other: No abdominal wall hernia. Musculoskeletal: No acute bone abnormality. Degenerative changes are seen within the lumbar spine. IMPRESSION: 1. Interval near complete evacuation of a bilobed intrapancreatic/peripancreatic fluid collection. Residual 2.5 cm loculated fluid collection is seen adjacent to the gastric fundus. 2. Mild peripancreatic inflammatory changes involving the head and proximal body of the pancreas, possibly postsurgical in nature or related to mild superimposed acute or subacute pancreatitis. Focal discontinuity involving the mid body of the pancreas suggesting focal pancreatic necrosis. 3. Mild circumferential thickening of the mid and distal body of the stomach, possibly postsurgical in nature, related to adjacent inflammatory process involving the pancreas, or  reflecting superimposed infectious or inflammatory gastritis. No evidence of obstruction or perforation. 4. Severe descending and sigmoid colonic diverticulosis without superimposed acute inflammatory change. 5. Persistent marked narrowing of the portosplenic confluence. The portal vein remains patent. Electronically Signed   By: Fidela Salisbury M.D.   On: 07/20/2021 03:26    Micro Results   Today   Subjective    Yeraldi Hojnacki today has no new complaints      -Eating and drinking okay without significant postprandial abdominal pain No fever  Or chills  No Nausea, Vomiting or Diarrhea   Patient has been seen and examined prior to discharge   Objective   Blood pressure (!) 139/91, pulse 77, temperature 98.2 F (36.8 C), resp. rate 17, height '5\' 3"'$  (1.6 m), weight 70.4 kg, SpO2 97 %.   Intake/Output Summary (Last 24 hours) at 07/24/2021 1115 Last data filed at 07/24/2021 0900 Gross per 24 hour  Intake 600 ml  Output 1900 ml  Net -1300 ml    Exam Gen:- Awake Alert, no acute distress  HEENT:- [place].AT, No sclera icterus Neck-Supple Neck,No JVD,.  Lungs-  CTAB , good air movement bilaterally CV- S1, S2 normal, regular Abd-  +ve B.Sounds, Abd Soft, No significant tenderness,    Extremity/Skin:- No  edema,   good pulses Psych-affect is appropriate, oriented x3 Neuro-no new focal deficits, no tremors    Data Review   CBC w Diff:  Lab Results  Component Value Date   WBC 5.9 07/23/2021   HGB 8.2 (L) 07/23/2021   HGB 11.1 06/18/2021   HCT 26.1 (L) 07/23/2021   HCT 32.5 (L) 06/18/2021   PLT 335 07/23/2021   PLT 515 (H) 06/18/2021   LYMPHOPCT 18 06/01/2021   MONOPCT 8 06/01/2021   EOSPCT 2 06/01/2021   BASOPCT 1 06/01/2021    CMP:  Lab Results  Component Value Date   NA 141 07/23/2021   NA 142 06/18/2021   K 3.6 07/23/2021   CL 107 07/23/2021   CO2 27 07/23/2021   BUN <5 (L) 07/23/2021   BUN 10 06/18/2021   CREATININE 0.72 07/23/2021   CREATININE 0.89 01/15/2016    PROT 5.6 (L) 07/23/2021   PROT 6.5 06/18/2021   ALBUMIN 2.3 (L) 07/23/2021   ALBUMIN 3.2 (L) 06/18/2021   BILITOT 0.7 07/23/2021   BILITOT 0.5 06/18/2021   ALKPHOS 62 07/23/2021   AST 14 (L) 07/23/2021   ALT 8 07/23/2021  .  Total Discharge time is about 33 minutes  Roxan Hockey M.D on 07/24/2021 at 11:15 AM  Go to [email or web address] -  for contact info  [facility] - Office  [phone]

## 2021-07-24 NOTE — Care Management Important Message (Signed)
Important Message  Patient Details  Name: Amber Church MRN: [id number] Date of Birth: 05-19-1955   Medicare Important Message Given:  Yes     Tommy Medal 07/24/2021, 9:50 AM

## 2021-07-24 NOTE — Progress Notes (Signed)
    Subjective: Feeling fairly well. Still with a "twinge" of abdominal pain but not affected by eating. Tolerating soft diet well. No nausea or vomiting. Had a small BM this morning. No brbpr or melena.   Eating crackers and ice cream when I walked in.   Objective: Vital signs in last 24 hours: Temp:  [98.1 F (36.7 C)-98.2 F (36.8 C)] 98.2 F (36.8 C) (07/18 0520) Pulse Rate:  [72-77] 77 (07/18 0520) Resp:  [16-18] 17 (07/18 0520) BP: (122-139)/(89-91) 139/91 (07/18 0520) SpO2:  [94 %-97 %] 97 % (07/18 0520) Last BM Date : 07/24/21 General:   Alert and oriented, pleasant, NAD.  Head:  Normocephalic and atraumatic. Eyes:  No icterus, sclera clear. Conjuctiva pink.  Abdomen:  Bowel sounds present, soft, non-distended. Mild TTP in epigastric and LUQ region. No rebound or guarding.  Msk:  Symmetrical without gross deformities. Normal posture. Extremities:  Without edema. Neurologic:  Alert and  oriented x4;  grossly normal neurologically. Psych:  Normal mood and affect.  Intake/Output from previous day: 07/17 0701 - 07/18 0700 In: 540 [P.O.:540] Out: 1900 [Urine:1900] Intake/Output this shift: No intake/output data recorded.  Lab Results: Recent Labs    07/23/21 0441  WBC 5.9  HGB 8.2*  HCT 26.1*  PLT 335   BMET Recent Labs    07/23/21 0441  NA 141  K 3.6  CL 107  CO2 27  GLUCOSE 95  BUN <5*  CREATININE 0.72  CALCIUM 8.2*   LFT Recent Labs    07/23/21 0441  PROT 5.6*  ALBUMIN 2.3*  AST 14*  ALT 8  ALKPHOS 43  BILITOT 0.7    Assessment: 66 year old female with recent bout of complicated necrotizing pancreatitis post cholecystectomy, presumed biliary origin. She underwent EUS cyst gastrotomy, transgastric Axios with pigtail stents (removed on 6/30), endoscopic necrosectomy x2 at [facility], now admitted with late flare of acute pancreatitis, possibly postsurgical in nature vs mild superimposed acute or subacute pancreatitis. She appears to have a small  cyst/necrotizing component on CT scan this hospitalization.  However, overall greatly improved over that seen previously. Clinically, she has done very well with supportive measures. She is tolerating soft diet without any worsening abdominal pain. Denies nausea or vomiting. Only with mild epigastric and LUQ TTP on exam. Still requiring some pain medications, but frequency decreasing every day.   Plan: Continue with soft diet.  Continue PPI daily.  Continue Creon.  Continue supportive measures.  GI will sign off. Patient needs follow-up with [facility] ASAP. Patient plans to call once she is discharged to arrange follow-up.    LOS: 4 days    07/24/2021, 10:29 AM   Aliene Altes, PA-C [facility]

## 2021-07-25 ENCOUNTER — Telehealth: Payer: Self-pay

## 2021-07-25 NOTE — Patient Outreach (Signed)
  Care Coordination TOC Note Transition Care Management Unsuccessful Follow-up Telephone Call  Date of discharge and from where:  Amber [facility] 07/20/21-07/24/21  Attempts:  1st Attempt  Reason for unsuccessful TCM follow-up call:  Unable to leave message as voicemail not identified.

## 2021-07-26 ENCOUNTER — Other Ambulatory Visit: Payer: Self-pay | Admitting: *Deleted

## 2021-07-26 NOTE — Patient Outreach (Signed)
  Care Coordination [facility] Note Transition Care Management Unsuccessful Follow-up Telephone Call  Date of discharge and from where:  07/24/21 [facility]  Attempts:  2nd Attempt  Reason for unsuccessful TCM follow-up call:  Left voice message.  Emelia Loron RN, BSN [place] [phone] [email or web address]

## 2021-07-27 ENCOUNTER — Telehealth: Payer: Self-pay

## 2021-07-27 NOTE — Telephone Encounter (Signed)
Transition Care Management Follow-up Telephone Call Date of discharge and from where: 07/24/2021  [facility] How have you been since you were released from the hospital? Doing well Any questions or concerns? No  Items Reviewed: Did the pt receive and understand the discharge instructions provided? Yes  Medications obtained and verified? Yes  Other? No  Any new allergies since your discharge? No  Dietary orders reviewed? Yes Do you have support at home? Yes   Home Care and Equipment/Supplies: Were home health services ordered? no If so, what is the name of the agency? NA  Has the agency set up a time to come to the patient's home? not applicable Were any new equipment or medical supplies ordered?  No What is the name of the medical supply agency? NA Were you able to get the supplies/equipment? not applicable Do you have any questions related to the use of the equipment or supplies? No  Functional Questionnaire: (I = Independent and D = Dependent) ADLs: I  Bathing/Dressing- I  Meal Prep- I  Eating- I  Maintaining continence- I  Transferring/Ambulation- I  Managing Meds- I  Follow up appointments reviewed:  PCP Hospital f/u appt confirmed? Yes  Scheduled to see [employer] on 07/31/2021 Specialist Hospital f/u appt confirmed? No   Are transportation arrangements needed? No  If their condition worsens, is the pt aware to call PCP or go to the Emergency Dept.? Yes Was the patient provided with contact information for the PCP's office or ED? Yes Was to pt encouraged to call back with questions or concerns? Yes  Tomasa Rand, RN, BSN, CEN [facility] [employer] [phone]

## 2021-07-31 ENCOUNTER — Ambulatory Visit (INDEPENDENT_AMBULATORY_CARE_PROVIDER_SITE_OTHER): Payer: Medicare Other | Admitting: Family Medicine

## 2021-07-31 ENCOUNTER — Encounter: Payer: Self-pay | Admitting: Family Medicine

## 2021-07-31 ENCOUNTER — Telehealth: Payer: Self-pay | Admitting: Family Medicine

## 2021-07-31 VITALS — BP 108/78 | HR 78 | Temp 97.7°F | Wt 151.2 lb

## 2021-07-31 DIAGNOSIS — K8591 Acute pancreatitis with uninfected necrosis, unspecified: Secondary | ICD-10-CM

## 2021-07-31 NOTE — Telephone Encounter (Signed)
Please call she forgot to ask when she was here She wants to know if you can give her something or recommend something to loosen her stools, states she has a Hemorrhoid and BM's are painful especially with a hard BM

## 2021-07-31 NOTE — Progress Notes (Signed)
   Subjective:    Patient ID: Amber Church, female    DOB: August 18, 1955, 66 y.o.   MRN: [id number]  HPI She is here for recheck after another hospitalization for treatment of pancreatitis.  She was admitted on July 14 and sent home on the 18th.  The hospital record including lab and x-rays was reviewed.  Blood work and CT scan showed again evidence of pancreatitis with some necrosis present.  She apparently spondee very nicely to Creon with decreasing pain and she also states that her weight has actually increased.  She has a scheduled appointment with [facility] within the next several days.  She did complain of question of hemorrhoids   Review of Systems     Objective:   Physical Exam Alert and in no distress.  Cardiac exam shows regular rhythm without murmurs or gallops.  Lungs are clear to auscultation.  Abdominal exam shows no masses or tenderness.  Rectal exam was negative. Recent medical record including labs, x-rays and discharge summary was reviewed.      Assessment & Plan:  Acute pancreatitis with uninfected necrosis, unspecified pancreatitis type - Plan: CBC with Differential/Platelet, Comprehensive metabolic panel She is scheduled to see specialist at [facility].  She will continue on Creon.  Again questioned to avoid greasy foods, NSAIDs etc.  Reviewed recommendations in the discharge summary with her again.

## 2021-08-01 DIAGNOSIS — K851 Biliary acute pancreatitis without necrosis or infection: Secondary | ICD-10-CM | POA: Diagnosis not present

## 2021-08-01 DIAGNOSIS — K863 Pseudocyst of pancreas: Secondary | ICD-10-CM | POA: Diagnosis not present

## 2021-08-01 LAB — COMPREHENSIVE METABOLIC PANEL
ALT: 9 IU/L (ref 0–32)
AST: 23 IU/L (ref 0–40)
Albumin/Globulin Ratio: 1 — ABNORMAL LOW (ref 1.2–2.2)
Albumin: 3.8 g/dL — ABNORMAL LOW (ref 3.9–4.9)
Alkaline Phosphatase: 98 IU/L (ref 44–121)
BUN/Creatinine Ratio: 11 — ABNORMAL LOW (ref 12–28)
BUN: 9 mg/dL (ref 8–27)
Bilirubin Total: 0.5 mg/dL (ref 0.0–1.2)
CO2: 23 mmol/L (ref 20–29)
Calcium: 9.4 mg/dL (ref 8.7–10.3)
Chloride: 100 mmol/L (ref 96–106)
Creatinine, Ser: 0.79 mg/dL (ref 0.57–1.00)
Globulin, Total: 3.7 g/dL (ref 1.5–4.5)
Glucose: 88 mg/dL (ref 70–99)
Potassium: 5 mmol/L (ref 3.5–5.2)
Sodium: 139 mmol/L (ref 134–144)
Total Protein: 7.5 g/dL (ref 6.0–8.5)
eGFR: 83 mL/min/{1.73_m2} (ref >59)

## 2021-08-01 LAB — CBC WITH DIFFERENTIAL/PLATELET
Basophils Absolute: 0 10*3/uL (ref 0.0–0.2)
Basos: 1 %
EOS (ABSOLUTE): 0.3 10*3/uL (ref 0.0–0.4)
Eos: 4 %
Hematocrit: 33.6 % — ABNORMAL LOW (ref 34.0–46.6)
Hemoglobin: 10.9 g/dL — ABNORMAL LOW (ref 11.1–15.9)
Immature Grans (Abs): 0 10*3/uL (ref 0.0–0.1)
Immature Granulocytes: 0 %
Lymphocytes Absolute: 2.5 10*3/uL (ref 0.7–3.1)
Lymphs: 31 %
MCH: 27.8 pg (ref 26.6–33.0)
MCHC: 32.4 g/dL (ref 31.5–35.7)
MCV: 86 fL (ref 79–97)
Monocytes Absolute: 0.6 10*3/uL (ref 0.1–0.9)
Monocytes: 7 %
Neutrophils Absolute: 4.5 10*3/uL (ref 1.4–7.0)
Neutrophils: 57 %
Platelets: 503 10*3/uL — ABNORMAL HIGH (ref 150–450)
RBC: 3.92 x10E6/uL (ref 3.77–5.28)
RDW: 15.7 % — ABNORMAL HIGH (ref 11.7–15.4)
WBC: 7.8 10*3/uL (ref 3.4–10.8)

## 2021-08-15 ENCOUNTER — Telehealth: Payer: Self-pay | Admitting: Family Medicine

## 2021-08-15 DIAGNOSIS — K863 Pseudocyst of pancreas: Secondary | ICD-10-CM

## 2021-08-15 DIAGNOSIS — R1114 Bilious vomiting: Secondary | ICD-10-CM

## 2021-08-15 NOTE — Telephone Encounter (Signed)
Pt called for refills of Zofran. She states that she was given this at [facility] but needs refills and [facility] has given it to her before. Please send to [facility] and she can be reached at [phone].

## 2021-08-16 MED ORDER — ONDANSETRON HCL 4 MG PO TABS: 4.0000 mg | ORAL_TABLET | Freq: Three times a day (TID) | ORAL | 1 refills | Status: DC | PRN

## 2021-09-12 ENCOUNTER — Encounter: Payer: Self-pay | Admitting: Internal Medicine

## 2021-09-17 ENCOUNTER — Ambulatory Visit: Payer: Medicare Other | Admitting: Family Medicine

## 2021-09-17 DIAGNOSIS — K851 Biliary acute pancreatitis without necrosis or infection: Secondary | ICD-10-CM

## 2021-09-17 DIAGNOSIS — Z96651 Presence of right artificial knee joint: Secondary | ICD-10-CM

## 2021-09-17 DIAGNOSIS — K863 Pseudocyst of pancreas: Secondary | ICD-10-CM

## 2021-09-17 DIAGNOSIS — E785 Hyperlipidemia, unspecified: Secondary | ICD-10-CM

## 2021-09-17 DIAGNOSIS — I10 Essential (primary) hypertension: Secondary | ICD-10-CM

## 2021-09-17 DIAGNOSIS — Z9049 Acquired absence of other specified parts of digestive tract: Secondary | ICD-10-CM

## 2021-09-17 DIAGNOSIS — M48062 Spinal stenosis, lumbar region with neurogenic claudication: Secondary | ICD-10-CM

## 2021-09-17 NOTE — Progress Notes (Unsigned)
Amber Church is a 66 y.o. female who presents for annual wellness visit and follow-up on chronic medical conditions.  She has the following concerns:  Immunizations and Health Maintenance Immunization History  Administered Date(s) Administered   DTaP 03/13/1998   Influenza Split 10/22/2011, 10/21/2014   Influenza Whole 11/17/2008, 11/03/2010   Influenza,inj,Quad PF,6+ Mos 02/24/2014, 01/15/2016, 10/09/2016, 10/13/2017, 10/03/2018   Influenza-Unspecified 10/09/2016   Moderna Sars-Covid-2 Vaccination 03/03/2019, 03/31/2019, 11/10/2019, 05/23/2020   Tdap 08/06/2010, 08/18/2020   Zoster Recombinat (Shingrix) 06/03/2017, 08/04/2017   Health Maintenance Due  Topic Date Due   COVID-19 Vaccine (5 - Moderna series) 07/18/2020   COLONOSCOPY (Pts 45-73yr Insurance coverage will need to be confirmed)  03/17/2021   Pneumonia Vaccine 65 Years old (1 - PCV) Never done   DEXA SCAN  06/02/2021   INFLUENZA VACCINE  08/07/2021    Last Pap smear: Last mammogram: Last colonoscopy: Last DEXA: Dentist: Ophtho: Exercise:  Other doctors caring for patient include:  Advanced directives:    Depression screen:  See questionnaire below.     08/18/2020    2:38 PM 03/09/2012   10:10 AM  Depression screen PHQ 2/9  Decreased Interest 0 1  Down, Depressed, Hopeless 2 1  PHQ - 2 Score 2 2  Altered sleeping 1   Tired, decreased energy 1   Change in appetite 0   Feeling bad or failure about yourself  0   Trouble concentrating 0   Moving slowly or fidgety/restless 1   Suicidal thoughts 0   PHQ-9 Score 5   Difficult doing work/chores Somewhat difficult     Fall Risk Screen: see questionnaire below.    08/18/2020    2:37 PM  Fall Risk   Falls in the past year? 0  Number falls in past yr: 0  Injury with Fall? 0  Risk for fall due to : No Fall Risks  Follow up Falls evaluation completed    ADL screen:  See questionnaire below Functional Status Survey:     Review of  Systems Constitutional: -, -unexpected weight change, -anorexia, -fatigue Allergy: -sneezing, -itching, -congestion Dermatology: denies changing moles, rash, lumps ENT: -runny nose, -ear pain, -sore throat,  Cardiology:  -chest pain, -palpitations, -orthopnea, Respiratory: -cough, -shortness of breath, -dyspnea on exertion, -wheezing,  Gastroenterology: -abdominal pain, -nausea, -vomiting, -diarrhea, -constipation, -dysphagia Hematology: -bleeding or bruising problems Musculoskeletal: -arthralgias, -myalgias, -joint swelling, -back pain, - Ophthalmology: -vision changes,  Urology: -dysuria, -difficulty urinating,  -urinary frequency, -urgency, incontinence Neurology: -, -numbness, , -memory loss, -falls, -dizziness    PHYSICAL EXAM:  There were no vitals taken for this visit.  General Appearance: Alert, cooperative, no distress, appears stated age Head: Normocephalic, without obvious abnormality, atraumatic Eyes: PERRL, conjunctiva/corneas clear, EOM's intact, fundi benign Ears: Normal TM's and external ear canals Nose: Nares normal, mucosa normal, no drainage or sinus tenderness Throat: Lips, mucosa, and tongue normal; teeth and gums normal Neck: Supple, no lymphadenopathy;  thyroid:  no enlargement/tenderness/nodules; no carotid bruit or JVD Lungs: Clear to auscultation bilaterally without wheezes, rales or ronchi; respirations unlabored Heart: Regular rate and rhythm, S1 and S2 normal, no murmur, rubor gallop Abdomen: Soft, non-tender, nondistended, normoactive bowel sounds,  no masses, no hepatosplenomegaly Extremities: No clubbing, cyanosis or edema Pulses: 2+ and symmetric all extremities Skin:  Skin color, texture, turgor normal, no rashes or lesions Lymph nodes: Cervical, supraclavicular, and axillary nodes normal Neurologic:  CNII-XII intact, normal strength, sensation and gait; reflexes 2+ and symmetric throughout Psych: Normal mood, affect, hygiene and  grooming.  ASSESSMENT/PLAN:    Discussed monthly self breast exams and yearly mammograms; at least 30 minutes of aerobic activity at least 5 days/week and weight-bearing exercise 2x/week; proper sunscreen use reviewed; healthy diet, including goals of calcium and vitamin D intake and alcohol recommendations (less than or equal to 1 drink/day) reviewed; regular seatbelt use; changing batteries in smoke detectors.  Immunization recommendations discussed.  Colonoscopy recommendations reviewed   Medicare Attestation I have personally reviewed: The patient's medical and social history Their use of alcohol, tobacco or illicit drugs Their current medications and supplements The patient's functional ability including ADLs,fall risks, home safety risks, cognitive, and hearing and visual impairment Diet and physical activities Evidence for depression or mood disorders  The patient's weight, height, and BMI have been recorded in the chart.  I have made referrals, counseling, and provided education to the patient based on review of the above and I have provided the patient with a written personalized care plan for preventive services.     Jill Alexanders, MD   09/17/2021

## 2021-09-19 NOTE — Addendum Note (Signed)
Addended by: Elyse Jarvis on: 09/19/2021 10:58 AM   Modules accepted: Orders, Level of Service

## 2021-09-19 NOTE — Progress Notes (Signed)
This encounter was created in error - please disregard.

## 2021-09-30 ENCOUNTER — Other Ambulatory Visit: Payer: Self-pay | Admitting: Family Medicine

## 2021-09-30 DIAGNOSIS — R1114 Bilious vomiting: Secondary | ICD-10-CM

## 2021-09-30 DIAGNOSIS — K863 Pseudocyst of pancreas: Secondary | ICD-10-CM

## 2021-10-02 ENCOUNTER — Encounter: Payer: Self-pay | Admitting: Family Medicine

## 2021-10-02 ENCOUNTER — Ambulatory Visit (INDEPENDENT_AMBULATORY_CARE_PROVIDER_SITE_OTHER): Payer: Medicare Other | Admitting: Family Medicine

## 2021-10-02 VITALS — BP 110/76 | HR 93 | Temp 97.9°F | Ht 63.0 in | Wt 153.0 lb

## 2021-10-02 DIAGNOSIS — E2839 Other primary ovarian failure: Secondary | ICD-10-CM | POA: Diagnosis not present

## 2021-10-02 DIAGNOSIS — Z96651 Presence of right artificial knee joint: Secondary | ICD-10-CM | POA: Diagnosis not present

## 2021-10-02 DIAGNOSIS — Z8719 Personal history of other diseases of the digestive system: Secondary | ICD-10-CM

## 2021-10-02 DIAGNOSIS — Z0000 Encounter for general adult medical examination without abnormal findings: Secondary | ICD-10-CM | POA: Diagnosis not present

## 2021-10-02 DIAGNOSIS — M48062 Spinal stenosis, lumbar region with neurogenic claudication: Secondary | ICD-10-CM

## 2021-10-02 DIAGNOSIS — K863 Pseudocyst of pancreas: Secondary | ICD-10-CM | POA: Diagnosis not present

## 2021-10-02 DIAGNOSIS — Z1211 Encounter for screening for malignant neoplasm of colon: Secondary | ICD-10-CM

## 2021-10-02 DIAGNOSIS — Z9049 Acquired absence of other specified parts of digestive tract: Secondary | ICD-10-CM

## 2021-10-02 DIAGNOSIS — I10 Essential (primary) hypertension: Secondary | ICD-10-CM

## 2021-10-02 DIAGNOSIS — E785 Hyperlipidemia, unspecified: Secondary | ICD-10-CM | POA: Diagnosis not present

## 2021-10-02 NOTE — Progress Notes (Signed)
Amber Church is a 66 y.o. female who presents for welcome to Medicare visit and follow-up on chronic medical conditions.  She has had a very rough year dealing with cholecystitis as well as pancreatitis, pancreatic pseudocyst and presently is taking Creon to help.  She has had difficulty in the past with elevated liver enzymes as well.  She had nutritional issues with this but seems to be doing much better.  She is taking Protonix as well as the Creon.  She has lost some weight and is now not taking lisinopril.  She is followed by Dr.Roarkand [place].  She has a remote history of having a colonoscopy but mentions having a polyp and subsequently to colonoscopies after that which were apparently negative.  I do not have records of that.  She does continue on Pravachol and having no difficulty with that.  Her medical record was reviewed. Immunizations and Health Maintenance Immunization History  Administered Date(s) Administered   DTaP 03/13/1998   Influenza Split 10/22/2011, 10/21/2014   Influenza Whole 11/17/2008, 11/03/2010   Influenza,inj,Quad PF,6+ Mos 02/24/2014, 01/15/2016, 10/09/2016, 10/13/2017, 10/03/2018   Influenza-Unspecified 10/09/2016   Moderna Sars-Covid-2 Vaccination 03/03/2019, 03/31/2019, 11/10/2019, 05/23/2020   Tdap 08/06/2010, 08/18/2020   Zoster Recombinat (Shingrix) 06/03/2017, 08/04/2017   Health Maintenance Due  Topic Date Due   COVID-19 Vaccine (5 - Moderna risk series) 07/18/2020   COLONOSCOPY (Pts 45-63yr Insurance coverage will need to be confirmed)  03/17/2021   Pneumonia Vaccine 65 Years old (1 - PCV) Never done   DEXA SCAN  06/02/2021   INFLUENZA VACCINE  08/07/2021    Last Pap smear: 10/02/21  Last mammogram: 09/01/20 Last colonoscopy: 03/18/2011 Dr. HAmedeo Plenty Last DEXA: 11/21/2008 Dentist: over a year ago Ophtho: Q year  Exercise: waling two days a week  Other doctors caring for patient include: Dr. HAmedeo PlentyGI            Dr. DSharol Givenand Dr. [facility]              Advanced directives: Does Patient Have a Medical Advance Directive?: No Would patient like information on creating a medical advance directive?: Yes (ED - Information included in AVS)  Depression screen:  See questionnaire below.     10/02/2021   11:34 AM 08/18/2020    2:38 PM 03/09/2012   10:10 AM  Depression screen PHQ 2/9  Decreased Interest 0 0 1  Down, Depressed, Hopeless 0 2 1  PHQ - 2 Score 0 2 2  Altered sleeping  1   Tired, decreased energy  1   Change in appetite  0   Feeling bad or failure about yourself   0   Trouble concentrating  0   Moving slowly or fidgety/restless  1   Suicidal thoughts  0   PHQ-9 Score  5   Difficult doing work/chores  Somewhat difficult     Fall Risk Screen: see questionnaire below.    10/02/2021   11:33 AM 08/18/2020    2:37 PM  Fall Risk   Falls in the past year? 0 0  Number falls in past yr: 0 0  Injury with Fall? 0 0  Risk for fall due to : No Fall Risks No Fall Risks  Follow up Falls evaluation completed Falls evaluation completed    ADL screen:  See questionnaire below Functional Status Survey: Is the patient deaf or have difficulty hearing?: Yes (left side) Does the patient have difficulty seeing, even when wearing glasses/contacts?: No Does the  patient have difficulty concentrating, remembering, or making decisions?: Yes (per pt) Does the patient have difficulty walking or climbing stairs?: Yes (knee pain in left leg) Does the patient have difficulty dressing or bathing?: No Does the patient have difficulty doing errands alone such as visiting a doctor's office or shopping?: No   Review of Systems Constitutional: -, -unexpected weight change, -anorexia, -fatigue Allergy: -sneezing, -itching, -congestion Dermatology: denies changing moles, rash, lumps ENT: -runny nose, -ear pain, -sore throat,  Cardiology:  -chest pain, -palpitations, -orthopnea, Respiratory: -cough, -shortness of breath, -dyspnea  on exertion, -wheezing,  Gastroenterology: -abdominal pain, -nausea, -vomiting, -diarrhea, -constipation, -dysphagia Hematology: -bleeding or bruising problems Musculoskeletal: -arthralgias, -myalgias, -joint swelling, -back pain, - Ophthalmology: -vision changes,  Urology: -dysuria, -difficulty urinating,  -urinary frequency, -urgency, incontinence Neurology: -, -numbness, , -memory loss, -falls, -dizziness    PHYSICAL EXAM: General Appearance: Alert, cooperative, no distress, appears stated age Head: Normocephalic, without obvious abnormality, atraumatic Eyes: PERRL, conjunctiva/corneas clear, EOM's intact,  Ears: Normal TM's and external ear canals Nose: Nares normal, mucosa normal, no drainage or sinus tenderness Throat: Lips, mucosa, and tongue normal; teeth and gums normal Neck: Supple, no lymphadenopathy;  thyroid:  no enlargement/tenderness/nodules; no carotid bruit or JVD Lungs: Clear to auscultation bilaterally without wheezes, rales or ronchi; respirations unlabored Heart: Regular rate and rhythm, S1 and S2 normal, no murmur, rubor gallop Abdomen: Soft, non-tender, nondistended, normoactive bowel sounds,  no masses, no hepatosplenomegaly Skin:  Skin color, texture, turgor normal, no rashes or lesions Lymph nodes: Cervical, supraclavicular, and axillary nodes normal Neurologic:  CNII-XII intact, normal strength, sensation and gait; reflexes 2+ and symmetric throughout Psych: Normal mood, affect, hygiene and grooming.  ASSESSMENT/PLAN: History of laparoscopic cholecystectomy  Estrogen deficiency - Plan: DG Bone Density  Screening for colon cancer - Plan: Cologuard  Primary hypertension - Plan: CBC with Differential/Platelet, Comprehensive metabolic panel  Hyperlipidemia LDL goal <100 - Plan: Lipid panel  S/P TKR (total knee replacement), right  Spinal stenosis of lumbar region with neurogenic claudication  Pancreatic pseudocyst  History of pancreatitis I will  order Cologuard as there is a question of whether she really had a precancerous polyp.  We will also hold her lisinopril since her blood pressure seems to be under good control with her present weight.  Continue Creon.  She should be set up for a Pap smear in the near future.   Discussed  mammograms; at least 30 minutes of aerobic activity at least 5 days/week and weight-bearing exercise 2x/week;  Immunization recommendations discussed.  Colonoscopy recommendations reviewed   Medicare Attestation I have personally reviewed: The patient's medical and social history Their use of alcohol, tobacco or illicit drugs Their current medications and supplements The patient's functional ability including ADLs,fall risks, home safety risks, cognitive, and hearing and visual impairment Diet and physical activities Evidence for depression or mood disorders  The patient's weight, height, and BMI have been recorded in the chart.  I have made referrals, counseling, and provided education to the patient based on review of the above and I have provided the patient with a written personalized care plan for preventive services.     Jill Alexanders, MD   10/02/2021

## 2021-10-02 NOTE — Patient Instructions (Addendum)
Schedule nurse visit October 3 or later  pneum 20 Health Maintenance, Female Adopting a healthy lifestyle and getting preventive care are important in promoting health and wellness. Ask your health care provider about: The right schedule for you to have regular tests and exams. Things you can do on your own to prevent diseases and keep yourself healthy. What should I know about diet, weight, and exercise? Eat a healthy diet  Eat a diet that includes plenty of vegetables, fruits, low-fat dairy products, and lean protein. Do not eat a lot of foods that are high in solid fats, added sugars, or sodium. Maintain a healthy weight Body mass index (BMI) is used to identify weight problems. It estimates body fat based on height and weight. Your health care provider can help determine your BMI and help you achieve or maintain a healthy weight. Get regular exercise Get regular exercise. This is one of the most important things you can do for your health. Most adults should: Exercise for at least 150 minutes each week. The exercise should increase your heart rate and make you sweat (moderate-intensity exercise). Do strengthening exercises at least twice a week. This is in addition to the moderate-intensity exercise. Spend less time sitting. Even light physical activity can be beneficial. Watch cholesterol and blood lipids Have your blood tested for lipids and cholesterol at 65 years of age, then have this test every 5 years. Have your cholesterol levels checked more often if: Your lipid or cholesterol levels are high. You are older than 65 years of age. You are at high risk for heart disease. What should I know about cancer screening? Depending on your health history and family history, you may need to have cancer screening at various ages. This may include screening for: Breast cancer. Cervical cancer. Colorectal cancer. Skin cancer. Lung cancer. What should I know about heart disease, diabetes,  and high blood pressure? Blood pressure and heart disease High blood pressure causes heart disease and increases the risk of stroke. This is more likely to develop in people who have high blood pressure readings or are overweight. Have your blood pressure checked: Every 3-5 years if you are 45-28 years of age. Every year if you are 35 years old or older. Diabetes Have regular diabetes screenings. This checks your fasting blood sugar level. Have the screening done: Once every three years after age 39 if you are at a normal weight and have a low risk for diabetes. More often and at a younger age if you are overweight or have a high risk for diabetes. What should I know about preventing infection? Hepatitis B If you have a higher risk for hepatitis B, you should be screened for this virus. Talk with your health care provider to find out if you are at risk for hepatitis B infection. Hepatitis C Testing is recommended for: Everyone born from [birth year] through 1964. Anyone with known risk factors for hepatitis C. Sexually transmitted infections (STIs) Get screened for STIs, including gonorrhea and chlamydia, if: You are sexually active and are younger than 65 years of age. You are older than 65 years of age and your health care provider tells you that you are at risk for this type of infection. Your sexual activity has changed since you were last screened, and you are at increased risk for chlamydia or gonorrhea. Ask your health care provider if you are at risk. Ask your health care provider about whether you are at high risk for HIV. Your health care  provider may recommend a prescription medicine to help prevent HIV infection. If you choose to take medicine to prevent HIV, you should first get tested for HIV. You should then be tested every 3 months for as long as you are taking the medicine. Pregnancy If you are about to stop having your period (premenopausal) and you may become pregnant, seek  counseling before you get pregnant. Take 400 to 800 micrograms (mcg) of folic acid every day if you become pregnant. Ask for birth control (contraception) if you want to prevent pregnancy. Osteoporosis and menopause Osteoporosis is a disease in which the bones lose minerals and strength with aging. This can result in bone fractures. If you are 60 years old or older, or if you are at risk for osteoporosis and fractures, ask your health care provider if you should: Be screened for bone loss. Take a calcium or vitamin D supplement to lower your risk of fractures. Be given hormone replacement therapy (HRT) to treat symptoms of menopause. Follow these instructions at home: Alcohol use Do not drink alcohol if: Your health care provider tells you not to drink. You are pregnant, may be pregnant, or are planning to become pregnant. If you drink alcohol: Limit how much you have to: 0-1 drink a day. Know how much alcohol is in your drink. In the [place] one drink equals one 12 oz bottle of beer (355 mL), one 5 oz glass of wine (148 mL), or one 1 oz glass of hard liquor (44 mL). Lifestyle Do not use any products that contain nicotine or tobacco. These products include cigarettes, chewing tobacco, and vaping devices, such as e-cigarettes. If you need help quitting, ask your health care provider. Do not use street drugs. Do not share needles. Ask your health care provider for help if you need support or information about quitting drugs. General instructions Schedule regular health, dental, and eye exams. Stay current with your vaccines. Tell your health care provider if: You often feel depressed. You have ever been abused or do not feel safe at home. Summary Adopting a healthy lifestyle and getting preventive care are important in promoting health and wellness. Follow your health care provider's instructions about healthy diet, exercising, and getting tested or screened for diseases. Follow your  health care provider's instructions on monitoring your cholesterol and blood pressure. This information is not intended to replace advice given to you by your health care provider. Make sure you discuss any questions you have with your health care provider. Document Revised: 05/15/2020 Document Reviewed: 05/15/2020 Elsevier Patient Education  Amber Church.

## 2021-10-03 LAB — CBC WITH DIFFERENTIAL/PLATELET
Basophils Absolute: 0 10*3/uL (ref 0.0–0.2)
Basos: 1 %
EOS (ABSOLUTE): 0.1 10*3/uL (ref 0.0–0.4)
Eos: 2 %
Hematocrit: 41 % (ref 34.0–46.6)
Hemoglobin: 13.2 g/dL (ref 11.1–15.9)
Immature Grans (Abs): 0 10*3/uL (ref 0.0–0.1)
Immature Granulocytes: 0 %
Lymphocytes Absolute: 2.7 10*3/uL (ref 0.7–3.1)
Lymphs: 41 %
MCH: 27 pg (ref 26.6–33.0)
MCHC: 32.2 g/dL (ref 31.5–35.7)
MCV: 84 fL (ref 79–97)
Monocytes Absolute: 0.4 10*3/uL (ref 0.1–0.9)
Monocytes: 6 %
Neutrophils Absolute: 3.3 10*3/uL (ref 1.4–7.0)
Neutrophils: 50 %
Platelets: 350 10*3/uL (ref 150–450)
RBC: 4.89 x10E6/uL (ref 3.77–5.28)
RDW: 15.5 % — ABNORMAL HIGH (ref 11.7–15.4)
WBC: 6.6 10*3/uL (ref 3.4–10.8)

## 2021-10-03 LAB — LIPID PANEL
Chol/HDL Ratio: 4.2 ratio (ref 0.0–4.4)
Cholesterol, Total: 228 mg/dL — ABNORMAL HIGH (ref 100–199)
HDL: 54 mg/dL (ref >39)
LDL Chol Calc (NIH): 138 mg/dL — ABNORMAL HIGH (ref 0–99)
Triglycerides: 199 mg/dL — ABNORMAL HIGH (ref 0–149)
VLDL Cholesterol Cal: 36 mg/dL (ref 5–40)

## 2021-10-03 LAB — COMPREHENSIVE METABOLIC PANEL
ALT: 10 IU/L (ref 0–32)
AST: 15 IU/L (ref 0–40)
Albumin/Globulin Ratio: 1.2 (ref 1.2–2.2)
Albumin: 4.2 g/dL (ref 3.9–4.9)
Alkaline Phosphatase: 110 IU/L (ref 44–121)
BUN/Creatinine Ratio: 14 (ref 12–28)
BUN: 11 mg/dL (ref 8–27)
Bilirubin Total: 0.4 mg/dL (ref 0.0–1.2)
CO2: 22 mmol/L (ref 20–29)
Calcium: 9.4 mg/dL (ref 8.7–10.3)
Chloride: 103 mmol/L (ref 96–106)
Creatinine, Ser: 0.79 mg/dL (ref 0.57–1.00)
Globulin, Total: 3.5 g/dL (ref 1.5–4.5)
Glucose: 84 mg/dL (ref 70–99)
Potassium: 4.2 mmol/L (ref 3.5–5.2)
Sodium: 141 mmol/L (ref 134–144)
Total Protein: 7.7 g/dL (ref 6.0–8.5)
eGFR: 83 mL/min/{1.73_m2} (ref >59)

## 2021-10-13 DIAGNOSIS — Z1211 Encounter for screening for malignant neoplasm of colon: Secondary | ICD-10-CM | POA: Diagnosis not present

## 2021-10-13 LAB — COLOGUARD: Cologuard: NEGATIVE

## 2021-10-15 ENCOUNTER — Other Ambulatory Visit (INDEPENDENT_AMBULATORY_CARE_PROVIDER_SITE_OTHER): Payer: Medicare Other

## 2021-10-15 DIAGNOSIS — Z23 Encounter for immunization: Secondary | ICD-10-CM | POA: Diagnosis not present

## 2021-10-20 LAB — COLOGUARD: COLOGUARD: NEGATIVE

## 2021-10-29 ENCOUNTER — Encounter: Payer: Self-pay | Admitting: Internal Medicine

## 2021-12-12 DIAGNOSIS — Z1231 Encounter for screening mammogram for malignant neoplasm of breast: Secondary | ICD-10-CM | POA: Diagnosis not present

## 2021-12-12 LAB — HM MAMMOGRAPHY

## 2022-01-08 ENCOUNTER — Ambulatory Visit (INDEPENDENT_AMBULATORY_CARE_PROVIDER_SITE_OTHER): Payer: Medicare Other

## 2022-01-08 ENCOUNTER — Ambulatory Visit: Payer: Self-pay

## 2022-01-08 ENCOUNTER — Ambulatory Visit (INDEPENDENT_AMBULATORY_CARE_PROVIDER_SITE_OTHER): Payer: Medicare Other | Admitting: Orthopedic Surgery

## 2022-01-08 DIAGNOSIS — G8929 Other chronic pain: Secondary | ICD-10-CM | POA: Diagnosis not present

## 2022-01-08 DIAGNOSIS — Z96651 Presence of right artificial knee joint: Secondary | ICD-10-CM | POA: Diagnosis not present

## 2022-01-08 DIAGNOSIS — M25562 Pain in left knee: Secondary | ICD-10-CM

## 2022-01-08 DIAGNOSIS — M25561 Pain in right knee: Secondary | ICD-10-CM

## 2022-01-09 ENCOUNTER — Encounter: Payer: Self-pay | Admitting: Orthopedic Surgery

## 2022-01-09 DIAGNOSIS — K862 Cyst of pancreas: Secondary | ICD-10-CM | POA: Diagnosis not present

## 2022-01-09 DIAGNOSIS — K851 Biliary acute pancreatitis without necrosis or infection: Secondary | ICD-10-CM | POA: Diagnosis not present

## 2022-01-09 NOTE — Progress Notes (Signed)
Office Visit Note   Patient: Amber Church           Date of Birth: 01/24/1955           MRN: [id number] Visit Date: 01/08/2022              Requested by: Denita Lung, MD [address] [place],  [place] [address] PCP: Denita Lung, MD  Chief Complaint  Patient presents with   Right Knee - Pain    HX right total knee 10/02/2018      HPI: Patient is a 67 year old woman who presents for evaluation for bilateral knee pain.  Patient is status post a right total knee arthroplasty over 3 years ago.  Patient states she feels like there is a lump on the medial aspect of the patella.  Assessment & Plan: Visit Diagnoses:  1. Chronic pain of both knees   2. S/P TKR (total knee replacement), right     Plan: Patient states she is not interested in injection for the left knee at this time.  Patient was given instructions for VMO strengthening of the right knee.  Discussed the possibility of formal physical therapy and patient wants to follow-up at the [facility].  Patient states she may like to proceed with an injection of the left knee at follow-up.  Patient states he recently has been hospitalized during the month of May.  She states she lost a lot of weight and muscle strength.  Follow-Up Instructions: No follow-ups on file.   Ortho Exam  Patient is alert, oriented, no adenopathy, well-dressed, normal affect, normal respiratory effort. Examination patient has VMO atrophy of the right knee.  The patella is midline but does have a lateral tilt secondary to the VMO atrophy.  Varus and valgus is stable there is no effusion the patella tracks midline.  Examination of the left knee there is no effusion collaterals and cruciates are stable there is crepitation with range of motion and tenderness to palpation of the medial lateral joint line.  Imaging: No results found. No images are attached to the encounter.  Labs: No results found for: "HGBA1C", "ESRSEDRATE", "CRP", "LABURIC",  "REPTSTATUS", "GRAMSTAIN", "CULT", "LABORGA"   Lab Results  Component Value Date   ALBUMIN 4.2 10/02/2021   ALBUMIN 3.8 (L) 07/31/2021   ALBUMIN 2.3 (L) 07/23/2021    Lab Results  Component Value Date   MG 2.0 06/01/2021   MG 2.0 05/21/2021   MG 2.2 05/18/2021   No results found for: "VD25OH"  No results found for: "PREALBUMIN"    Latest Ref Rng & Units 10/02/2021    1:34 PM 07/31/2021   11:45 AM 07/23/2021    4:41 AM  CBC EXTENDED  WBC 3.4 - 10.8 x10E3/uL 6.6  7.8  5.9   RBC 3.77 - 5.28 x10E6/uL 4.89  3.92  3.01   Hemoglobin 11.1 - 15.9 g/dL 13.2  10.9  8.2   HCT 34.0 - 46.6 % 41.0  33.6  26.1   Platelets 150 - 450 x10E3/uL 350  503  335   NEUT# 1.4 - 7.0 x10E3/uL 3.3  4.5    Lymph# 0.7 - 3.1 x10E3/uL 2.7  2.5       There is no height or weight on file to calculate BMI.  Orders:  Orders Placed This Encounter  Procedures   XR Knee 1-2 Views Right   XR Knee 1-2 Views Left   No orders of the defined types were placed in this encounter.  Procedures: No procedures performed  Clinical Data: No additional findings.  ROS:  All other systems negative, except as noted in the HPI. Review of Systems  Objective: Vital Signs: There were no vitals taken for this visit.  Specialty Comments:  No specialty comments available.  PMFS History: Patient Active Problem List   Diagnosis Date Noted   Pancreatic pseudocyst 05/31/2021   Polycythemia 05/16/2021   Spinal stenosis of lumbar region 04/04/2020   S/P TKR (total knee replacement), right 10/02/2018   Hypertension 08/06/2010   Hyperlipidemia LDL goal <100 08/06/2010   Past Medical History:  Diagnosis Date   Abnormal EKG    Abnormal pap    s/p cryotherapy   Anxiety    Arthritis    Depression    Diverticulosis    GERD (gastroesophageal reflux disease)    Hyperlipidemia    Hypertension    S/P colonoscopic polypectomy     Family History  Problem Relation Age of Onset   Alzheimer's disease Mother     Hyperlipidemia Mother    Other Father        died of gunshot wound   Osteoporosis Sister    Osteoporosis Sister    ALS Maternal Aunt    ALS Maternal Uncle    Diabetes Paternal Aunt    Cancer Maternal Grandfather        lung cancer   Cancer Paternal Grandmother        ?type   Heart disease Neg Hx     Past Surgical History:  Procedure Laterality Date   CHOLECYSTECTOMY N/A 05/18/2021   Procedure: LAPAROSCOPIC CHOLECYSTECTOMY;  Surgeon: Rusty Aus, DO;  Location: [facility];  Service: General;  Laterality: N/A;   COLONOSCOPY  03/18/11; 2000   Dr. Amedeo Plenty; diverticulosis in sigmoid colon   crysurgery  age 30   for abnormal pap   LAMINECTOMY  03/2002   TOTAL KNEE ARTHROPLASTY Right 10/02/2018   TOTAL KNEE ARTHROPLASTY Right 10/02/2018   Procedure: RIGHT TOTAL KNEE ARTHROPLASTY;  Surgeon: Newt Minion, MD;  Location: [facility];  Service: Orthopedics;  Laterality: Right;   Social History   Occupational History   Occupation: Fish farm manager: [employer]  Tobacco Use   Smoking status: Never   Smokeless tobacco: Never  Vaping Use   Vaping Use: Never used  Substance and Sexual Activity   Alcohol use: Yes    Comment: glass of wine 1-2 times per week.   Drug use: No   Sexual activity: Yes    Partners: Male    Comment: postmenopausal

## 2022-04-01 ENCOUNTER — Ambulatory Visit
Admission: RE | Admit: 2022-04-01 | Discharge: 2022-04-01 | Disposition: A | Discharge status: Home / Self Care | Payer: Medicare Other | Source: Ambulatory Visit | Attending: Family Medicine | Admitting: Family Medicine

## 2022-04-01 DIAGNOSIS — Z780 Asymptomatic menopausal state: Secondary | ICD-10-CM | POA: Diagnosis not present

## 2022-04-03 ENCOUNTER — Ambulatory Visit (INDEPENDENT_AMBULATORY_CARE_PROVIDER_SITE_OTHER): Payer: Medicare Other | Admitting: Family Medicine

## 2022-04-03 ENCOUNTER — Encounter: Payer: Self-pay | Admitting: Family Medicine

## 2022-04-03 VITALS — BP 124/78 | HR 78 | Temp 97.8°F | Resp 14 | Wt 152.8 lb

## 2022-04-03 DIAGNOSIS — M25542 Pain in joints of left hand: Secondary | ICD-10-CM

## 2022-04-03 DIAGNOSIS — M19049 Primary osteoarthritis, unspecified hand: Secondary | ICD-10-CM | POA: Diagnosis not present

## 2022-04-03 MED ORDER — TRIAMCINOLONE ACETONIDE 40 MG/ML IJ SUSP
40.0000 mg | Freq: Once | INTRAMUSCULAR | Status: AC
  Administered 2022-04-03: 40 mg via INTRA_ARTICULAR

## 2022-04-03 MED ORDER — TRIAMCINOLONE ACETONIDE 40 MG/ML IJ SUSP: 40.0000 mg | Freq: Once | INTRAMUSCULAR | Status: DC

## 2022-04-03 MED ORDER — LIDOCAINE HCL (PF) 1 % IJ SOLN
1.0000 mL | Freq: Once | INTRAMUSCULAR | Status: AC
  Administered 2022-04-03: 1 mL

## 2022-04-03 NOTE — Addendum Note (Signed)
Addended by: Randal Buba on: 04/03/2022 04:38 PM   Modules accepted: Orders

## 2022-04-03 NOTE — Progress Notes (Signed)
   Subjective:    Patient ID: Amber Church, female    DOB: January 01, 1956, 67 y.o.   MRN: [id number]  HPI She complains of a long history of difficulty with left thumb pain but much worse in the last 2 weeks.  No history of overuse or injury.  She is right-handed.   Review of Systems     Objective:   Physical Exam Full motion of the thumb at the DIP and PIP joint.  Some tenderness palpation over the [facility].  Good motion of that joint.       Assessment & Plan:  [facility] arthritis Discussed various options with her and she decided to have an injection.  The joint was identified and 1 cc of Xylocaine and 20 mg of Kenalog was injected without difficulty.  She did obtain 75% reduction in her pain relatively quickly.

## 2022-04-22 ENCOUNTER — Other Ambulatory Visit: Payer: Self-pay | Admitting: Family Medicine

## 2022-04-22 DIAGNOSIS — K863 Pseudocyst of pancreas: Secondary | ICD-10-CM

## 2022-04-22 DIAGNOSIS — R1114 Bilious vomiting: Secondary | ICD-10-CM

## 2022-06-17 ENCOUNTER — Other Ambulatory Visit: Payer: Self-pay | Admitting: Family Medicine

## 2022-06-17 DIAGNOSIS — K863 Pseudocyst of pancreas: Secondary | ICD-10-CM

## 2022-06-17 DIAGNOSIS — R1114 Bilious vomiting: Secondary | ICD-10-CM

## 2022-06-17 NOTE — Telephone Encounter (Signed)
Is this okay to refill? 

## 2022-07-22 ENCOUNTER — Telehealth: Payer: Self-pay | Admitting: Family Medicine

## 2022-07-22 MED ORDER — PANCRELIPASE (LIP-PROT-AMYL) 36000-114000 UNITS PO CPEP: 36000.0000 [IU] | ORAL_CAPSULE | Freq: Three times a day (TID) | ORAL | 2 refills | Status: AC

## 2022-07-22 NOTE — Telephone Encounter (Signed)
Ananda called and says when she was hospitalized last year, they put her on Creon and she has recently ran out of refills and the hospital says her pcp should be able to refill it for her. She says she is not ready to be off of this medication and it has helped with the problems she had and she is feeling uneasy with out it and asks if you can refill this for her to  [facility] - [place], [place] - [address]

## 2022-10-08 ENCOUNTER — Ambulatory Visit (INDEPENDENT_AMBULATORY_CARE_PROVIDER_SITE_OTHER): Payer: Medicare Other

## 2022-10-08 VITALS — BP 112/70 | HR 91 | Temp 98.1°F | Ht 64.25 in | Wt 147.0 lb

## 2022-10-08 DIAGNOSIS — Z0000 Encounter for general adult medical examination without abnormal findings: Secondary | ICD-10-CM

## 2022-10-08 NOTE — Patient Instructions (Signed)
Ms. Amber Church , Thank you for taking time to come for your Medicare Wellness Visit. I appreciate your ongoing commitment to your health goals. Please review the following plan we discussed and let me know if I can assist you in the future.   Referrals/Orders/Follow-Ups/Clinician Recommendations: none  This is a list of the screening recommended for you and due dates:  Health Maintenance  Topic Date Due   Colon Cancer Screening  03/17/2021   COVID-19 Vaccine (6 - 2023-24 season) 09/08/2022   Medicare Annual Wellness Visit  10/08/2023   Mammogram  12/13/2023   DTaP/Tdap/Td vaccine (4 - Td or Tdap) 08/19/2030   Pneumonia Vaccine  Completed   Flu Shot  Completed   DEXA scan (bone density measurement)  Completed   Hepatitis C Screening  Completed   Zoster (Shingles) Vaccine  Completed   HPV Vaccine  Aged Out    Advanced directives: (Provided) Advance directive discussed with you today. I have provided a copy for you to complete at home and have notarized. Once this is complete, please bring a copy in to our office so we can scan it into your chart.   Next Medicare Annual Wellness Visit scheduled for next year: Yes  Insert Preventive Care attachment Insert FALL PREVENTION attachment if needed

## 2022-10-08 NOTE — Progress Notes (Signed)
Subjective:   Amber Church is a 67 y.o. female who presents for an Initial Medicare Annual Wellness Visit.  Visit Complete: In person    Cardiac Risk Factors include: advanced age (>62men, >8 women);dyslipidemia;hypertension     Objective:    Today's Vitals   10/08/22 1529  BP: 112/70  Pulse: 91  Temp: 98.1 F (36.7 C)  TempSrc: Oral  SpO2: 99%  Weight: 147 lb (66.7 kg)  Height: 5' 4.25" (1.632 m)   Body mass index is 25.04 kg/m.     10/08/2022    3:42 PM 10/02/2021   11:32 AM 07/20/2021   12:49 AM 06/01/2021    3:00 PM 05/30/2021    1:08 PM 05/16/2021    9:45 AM 05/16/2021    6:24 AM  Advanced Directives  Does Patient Have a Medical Advance Directive? No No No No No No No  Would patient like information on creating a medical advance directive? Yes (MAU/Ambulatory/Procedural Areas - Information given) Yes (ED - Information included in AVS) No - Patient declined No - Patient declined No - Patient declined No - Patient declined No - Patient declined    Current Medications (verified) Outpatient Encounter Medications as of 10/08/2022  Medication Sig   acetaminophen (TYLENOL) 650 MG CR tablet Take 650 mg by mouth every 8 (eight) hours as needed for pain or fever.   lipase/protease/amylase (CREON) 36000 UNITS CPEP capsule Take 1 capsule (36,000 Units total) by mouth 3 (three) times daily before meals.   ondansetron (ZOFRAN) 4 MG tablet TAKE 1 TABLET BY MOUTH EVERY 8 HOURS AS NEEDED FOR NAUSEA AND VOMITING   lisinopril-hydrochlorothiazide (ZESTORETIC) 10-12.5 MG tablet Take 1 tablet by mouth daily. (Patient not taking: Reported on 07/31/2021)   pantoprazole (PROTONIX) 40 MG tablet Take 1 tablet (40 mg total) by mouth daily.   pravastatin (PRAVACHOL) 40 MG tablet Take 40 mg by mouth daily.   No facility-administered encounter medications on file as of 10/08/2022.    Allergies (verified) Patient has no known allergies.   History: Past Medical History:  Diagnosis Date    Abnormal EKG    Abnormal pap    s/p cryotherapy   Anxiety    Arthritis    Depression    Diverticulosis    GERD (gastroesophageal reflux disease)    Hyperlipidemia    Hypertension    S/P colonoscopic polypectomy    Past Surgical History:  Procedure Laterality Date   CHOLECYSTECTOMY N/A 05/18/2021   Procedure: LAPAROSCOPIC CHOLECYSTECTOMY;  Surgeon: Lewie Chamber, DO;  Location: [facility];  Service: General;  Laterality: N/A;   COLONOSCOPY  03/18/11; 2000   Dr. Madilyn Fireman; diverticulosis in sigmoid colon   crysurgery  age 2   for abnormal pap   LAMINECTOMY  03/2002   SPINE SURGERY     TOTAL KNEE ARTHROPLASTY Right 10/02/2018   TOTAL KNEE ARTHROPLASTY Right 10/02/2018   Procedure: RIGHT TOTAL KNEE ARTHROPLASTY;  Surgeon: Nadara Mustard, MD;  Location: [facility];  Service: Orthopedics;  Laterality: Right;   Family History  Problem Relation Age of Onset   Alzheimer's disease Mother    Hyperlipidemia Mother    Other Father        died of gunshot wound   Osteoporosis Sister    Osteoporosis Sister    ALS Maternal Aunt    ALS Maternal Uncle    Diabetes Paternal Aunt    Cancer Maternal Grandfather        lung cancer   Cancer Paternal Grandmother        ?  type   Heart disease Neg Hx    Social History   Socioeconomic History   Marital status: Married    Spouse name: Not on file   Number of children: 1   Years of education: Not on file   Highest education level: Not on file  Occupational History   Occupation: quality control    Employer: [employer]  Tobacco Use   Smoking status: Never   Smokeless tobacco: Never  Vaping Use   Vaping status: Never Used  Substance and Sexual Activity   Alcohol use: Yes    Comment: glass of wine 1-2 times per week.   Drug use: No   Sexual activity: Yes    Partners: Male    Comment: postmenopausal  Other Topics Concern   Not on file  Social History Narrative   Lives with husband and daughter (Archivist), 1 dog, 1 cat    Social Determinants of Health   Financial Resource Strain: Low Risk  (10/08/2022)   Overall Financial Resource Strain (CARDIA)    Difficulty of Paying Living Expenses: Not hard at all  Food Insecurity: No Food Insecurity (10/08/2022)   Hunger Vital Sign    Worried About Running Out of Food in the Last Year: Never true    Ran Out of Food in the Last Year: Never true  Transportation Needs: No Transportation Needs (10/08/2022)   PRAPARE - Administrator, Civil Service (Medical): No    Lack of Transportation (Non-Medical): No  Physical Activity: Sufficiently Active (10/08/2022)   Exercise Vital Sign    Days of Exercise per Week: 7 days    Minutes of Exercise per Session: 30 min  Stress: Stress Concern Present (10/08/2022)   [employer] of Occupational Health - Occupational Stress Questionnaire    Feeling of Stress : Rather much  Social Connections: Moderately Isolated (10/08/2022)   Social Connection and Isolation Panel [NHANES]    Frequency of Communication with Friends and Family: More than three times a week    Frequency of Social Gatherings with Friends and Family: More than three times a week    Attends Religious Services: Never    Database administrator or Organizations: No    Attends Engineer, structural: Never    Marital Status: Married    Tobacco Counseling Counseling given: Not Answered   Clinical Intake:  Pre-visit preparation completed: Yes  Pain : No/denies pain     Nutritional Status: BMI 25 -29 Overweight Nutritional Risks: None Diabetes: No  How often do you need to have someone help you when you read instructions, pamphlets, or other written materials from your doctor or pharmacy?: 1 - Never  Interpreter Needed?: No  Information entered by :: NAllen LPN   Activities of Daily Living    10/08/2022    3:30 PM  In your present state of health, do you have any difficulty performing the following activities:  Hearing? 1  Comment  needs to get hearing checked  Vision? 0  Difficulty concentrating or making decisions? 0  Walking or climbing stairs? 0  Dressing or bathing? 0  Doing errands, shopping? 0  Preparing Food and eating ? N  Using the Toilet? N  In the past six months, have you accidently leaked urine? Y  Comment nothing new  Do you have problems with loss of bowel control? N  Managing your Medications? N  Managing your Finances? N  Housekeeping or managing your Housekeeping? N    Patient Care  Team: Ronnald Nian, MD as PCP - General  Indicate any recent Medical Services you may have received from other than [facility] providers in the past year (date may be approximate).     Assessment:   This is a routine wellness examination for Marisah.  Hearing/Vision screen Hearing Screening - Comments:: Wants to get hearing checked Vision Screening - Comments:: No regular eye exams,    Goals Addressed             This Visit's Progress    Patient Stated       10/08/2022, wants to maintain weight at 150 pounds       Depression Screen    10/08/2022    3:44 PM 10/02/2021   11:34 AM 08/18/2020    2:38 PM 03/09/2012   10:10 AM  PHQ 2/9 Scores  PHQ - 2 Score 3 0 2 2  PHQ- 9 Score 7  5     Fall Risk    10/08/2022    3:44 PM 10/02/2021   11:33 AM 08/18/2020    2:37 PM  Fall Risk   Falls in the past year? 0 0 0  Number falls in past yr: 0 0 0  Injury with Fall? 0 0 0  Risk for fall due to : Medication side effect No Fall Risks No Fall Risks  Follow up Falls prevention discussed;Falls evaluation completed Falls evaluation completed Falls evaluation completed    MEDICARE RISK AT HOME: Medicare Risk at Home Any stairs in or around the home?: No If so, are there any without handrails?: No Home free of loose throw rugs in walkways, pet beds, electrical cords, etc?: Yes Adequate lighting in your home to reduce risk of falls?: Yes Life alert?: No Use of a cane, walker or w/c?: No Grab bars in the  bathroom?: No Shower chair or bench in shower?: No Elevated toilet seat or a handicapped toilet?: No  TIMED UP AND GO:  Was the test performed? Yes  Length of time to ambulate 10 feet: 5 sec Gait steady and fast without use of assistive device    Cognitive Function:        10/08/2022    3:49 PM  6CIT Screen  What Year? 0 points  What month? 0 points  What time? 0 points  Count back from 20 0 points  Months in reverse 0 points  Repeat phrase 2 points  Total Score 2 points    Immunizations Immunization History  Administered Date(s) Administered   DTaP 03/13/1998   Influenza Split 10/22/2011, 10/21/2014   Influenza Whole 11/17/2008, 11/03/2010   Influenza, High Dose Seasonal PF 09/24/2021   Influenza,inj,Quad PF,6+ Mos 02/24/2014, 01/15/2016, 10/09/2016, 10/13/2017, 10/03/2018   Influenza-Unspecified 10/09/2016   Moderna Sars-Covid-2 Vaccination 03/03/2019, 03/31/2019, 11/10/2019, 05/23/2020   PFIZER Comirnaty(Gray Top)Covid-19 Tri-Sucrose Vaccine 09/24/2021   PNEUMOCOCCAL CONJUGATE-20 10/15/2021   Tdap 08/06/2010, 08/18/2020   Unspecified SARS-COV-2 Vaccination 09/21/2022   Zoster Recombinant(Shingrix) 06/03/2017, 08/04/2017    TDAP status: Up to date  Flu Vaccine status: Up to date  Pneumococcal vaccine status: Up to date  Covid-19 vaccine status: Completed vaccines  Qualifies for Shingles Vaccine? Yes   Zostavax completed Yes   Shingrix Completed?: Yes  Screening Tests Health Maintenance  Topic Date Due   Colonoscopy  03/17/2021   COVID-19 Vaccine (7 - 2023-24 season) 11/16/2022   Medicare Annual Wellness (AWV)  10/08/2023   MAMMOGRAM  12/13/2023   DTaP/Tdap/Td (4 - Td or Tdap) 08/19/2030   Pneumonia Vaccine 65+ Years  old  Completed   INFLUENZA VACCINE  Completed   DEXA SCAN  Completed   Hepatitis C Screening  Completed   Zoster Vaccines- Shingrix  Completed   HPV VACCINES  Aged Out    Health Maintenance  Health Maintenance Due  Topic Date Due    Colonoscopy  03/17/2021    Colorectal cancer screening: Type of screening: Cologuard. Completed 10/13/2021. Repeat every 3 years  Mammogram status: declines at this time  Bone Density status: Completed 04/01/2022.   Lung Cancer Screening: (Low Dose CT Chest recommended if Age 46-80 years, 20 pack-year currently smoking OR have quit w/in 15years.) does not qualify.   Lung Cancer Screening Referral: no  Additional Screening:  Hepatitis C Screening: does qualify; Completed 01/15/2016  Vision Screening: Recommended annual ophthalmology exams for early detection of glaucoma and other disorders of the eye. Is the patient up to date with their annual eye exam?  No  Who is the provider or what is the name of the office in which the patient attends annual eye exams? none If pt is not established with a provider, would they like to be referred to a provider to establish care? No .   Dental Screening: Recommended annual dental exams for proper oral hygiene  Diabetic Foot Exam: n/a  Community Resource Referral / Chronic Care Management: CRR required this visit?  No   CCM required this visit?  No     Plan:     I have personally reviewed and noted the following in the patient's chart:   Medical and social history Use of alcohol, tobacco or illicit drugs  Current medications and supplements including opioid prescriptions. Patient is not currently taking opioid prescriptions. Functional ability and status Nutritional status Physical activity Advanced directives List of other physicians Hospitalizations, surgeries, and ER visits in previous 12 months Vitals Screenings to include cognitive, depression, and falls Referrals and appointments  In addition, I have reviewed and discussed with patient certain preventive protocols, quality metrics, and best practice recommendations. A written personalized care plan for preventive services as well as general preventive health recommendations  were provided to patient.     Barb Merino, LPN   16/01/958   After Visit Summary: (In Person-Printed) AVS printed and given to the patient  Nurse Notes: none

## 2022-10-23 ENCOUNTER — Ambulatory Visit: Payer: Medicare Other | Admitting: Family Medicine

## 2022-10-23 ENCOUNTER — Encounter: Payer: Self-pay | Admitting: Family Medicine

## 2022-10-23 VITALS — BP 124/74 | HR 95 | Temp 97.7°F | Wt 148.6 lb

## 2022-10-23 DIAGNOSIS — Z6379 Other stressful life events affecting family and household: Secondary | ICD-10-CM | POA: Diagnosis not present

## 2022-10-23 DIAGNOSIS — K068 Other specified disorders of gingiva and edentulous alveolar ridge: Secondary | ICD-10-CM

## 2022-10-23 DIAGNOSIS — L989 Disorder of the skin and subcutaneous tissue, unspecified: Secondary | ICD-10-CM

## 2022-10-23 NOTE — Progress Notes (Signed)
Subjective:    Patient ID: Amber Church, female    DOB: 06-30-1955, 67 y.o.   MRN: [id number]  HPI She is here for evaluation of a lesion on her anterior chest ,left breast and she also has a lesion in her mouth.  She states that she has had difficulty with sleep lately however further history indicates that she is helping take care of her grandchildren which takes up a good part of her day and keeps her from taking care of the things that she needs for herself.  Her daughter is about to have a third child which complicates the issue even further.   Review of Systems     Objective:    Physical Exam Exam of the mouth shows a 1 cm raised whitish lesion on the upper mid gum area. Exam of the chest shows an irregular vascular lesion approximately 1-1/2 to 2 cm in size.       Assessment & Plan:  Skin lesion - Plan: Ambulatory referral to Dermatology  Gum lesion - Plan: Ambulatory referral to Oral Maxillofacial Surgery  Stressful life events affecting family and household I explained that the 2 lesions need to be evaluated further and biopsied. I then discussed the stress that she is under dealing with family dynamics.  She feels distressed into having to help take care of the 2 grandchildren and another 1 coming along which is again going to add to her burden.  We had a long discussion concerning this as well as the stress that she is under and possibly enabling the parents to not be as responsible as they should.  After long discussion, I gave her the name of 2 different places to go for counseling to help her deal with this.  Explained to her that basically she has not allowed time for herself or her marriage and hopefully counseling can help put all this in proper perspective.

## 2022-11-06 ENCOUNTER — Telehealth: Payer: Self-pay | Admitting: Family Medicine

## 2022-11-06 NOTE — Telephone Encounter (Signed)
Pt called and states Amber Church derm told her that the doctor she was referred to is no longer there and will need another referral from Dr.Lalonde. Is this true or can you help with this?

## 2022-11-06 NOTE — Telephone Encounter (Signed)
Yea that's what I was thinking but wanted to be correct. Thank you!

## 2022-11-26 DIAGNOSIS — L821 Other seborrheic keratosis: Secondary | ICD-10-CM | POA: Diagnosis not present

## 2022-11-26 DIAGNOSIS — D492 Neoplasm of unspecified behavior of bone, soft tissue, and skin: Secondary | ICD-10-CM | POA: Diagnosis not present

## 2022-11-26 DIAGNOSIS — L986 Other infiltrative disorders of the skin and subcutaneous tissue: Secondary | ICD-10-CM | POA: Diagnosis not present

## 2022-11-26 DIAGNOSIS — L814 Other melanin hyperpigmentation: Secondary | ICD-10-CM | POA: Diagnosis not present

## 2022-12-15 DIAGNOSIS — R030 Elevated blood-pressure reading, without diagnosis of hypertension: Secondary | ICD-10-CM | POA: Diagnosis not present

## 2022-12-15 DIAGNOSIS — N3001 Acute cystitis with hematuria: Secondary | ICD-10-CM | POA: Diagnosis not present

## 2022-12-15 DIAGNOSIS — N390 Urinary tract infection, site not specified: Secondary | ICD-10-CM | POA: Diagnosis not present

## 2023-01-21 ENCOUNTER — Ambulatory Visit: Payer: Medicare Other | Admitting: Family Medicine

## 2023-02-10 DIAGNOSIS — D485 Neoplasm of uncertain behavior of skin: Secondary | ICD-10-CM | POA: Diagnosis not present

## 2023-02-10 DIAGNOSIS — L814 Other melanin hyperpigmentation: Secondary | ICD-10-CM | POA: Diagnosis not present

## 2023-02-10 DIAGNOSIS — D225 Melanocytic nevi of trunk: Secondary | ICD-10-CM | POA: Diagnosis not present

## 2023-02-10 DIAGNOSIS — Z7189 Other specified counseling: Secondary | ICD-10-CM | POA: Diagnosis not present

## 2023-02-10 DIAGNOSIS — L821 Other seborrheic keratosis: Secondary | ICD-10-CM | POA: Diagnosis not present

## 2023-03-04 ENCOUNTER — Encounter: Payer: Self-pay | Admitting: Internal Medicine

## 2023-03-25 ENCOUNTER — Ambulatory Visit (INDEPENDENT_AMBULATORY_CARE_PROVIDER_SITE_OTHER): Payer: Medicare Other | Admitting: Family Medicine

## 2023-03-25 ENCOUNTER — Encounter: Payer: Self-pay | Admitting: Family Medicine

## 2023-03-25 VITALS — BP 120/70 | HR 68 | Ht 63.0 in | Wt 150.6 lb

## 2023-03-25 DIAGNOSIS — Z0000 Encounter for general adult medical examination without abnormal findings: Secondary | ICD-10-CM

## 2023-03-25 DIAGNOSIS — E785 Hyperlipidemia, unspecified: Secondary | ICD-10-CM | POA: Diagnosis not present

## 2023-03-25 DIAGNOSIS — Z1231 Encounter for screening mammogram for malignant neoplasm of breast: Secondary | ICD-10-CM | POA: Diagnosis not present

## 2023-03-25 LAB — CBC WITH DIFFERENTIAL/PLATELET
Basophils Absolute: 0 10*3/uL (ref 0.0–0.2)
Basos: 0 %
EOS (ABSOLUTE): 0.1 10*3/uL (ref 0.0–0.4)
Eos: 2 %
Hematocrit: 44.1 % (ref 34.0–46.6)
Hemoglobin: 14.7 g/dL (ref 11.1–15.9)
Immature Grans (Abs): 0 10*3/uL (ref 0.0–0.1)
Immature Granulocytes: 0 %
Lymphocytes Absolute: 2.2 10*3/uL (ref 0.7–3.1)
Lymphs: 33 %
MCH: 29.9 pg (ref 26.6–33.0)
MCHC: 33.3 g/dL (ref 31.5–35.7)
MCV: 90 fL (ref 79–97)
Monocytes Absolute: 0.3 10*3/uL (ref 0.1–0.9)
Monocytes: 5 %
Neutrophils Absolute: 3.9 10*3/uL (ref 1.4–7.0)
Neutrophils: 60 %
Platelets: 303 10*3/uL (ref 150–450)
RBC: 4.92 x10E6/uL (ref 3.77–5.28)
RDW: 13.1 % (ref 11.7–15.4)
WBC: 6.5 10*3/uL (ref 3.4–10.8)

## 2023-03-25 LAB — COMPREHENSIVE METABOLIC PANEL
ALT: 13 IU/L (ref 0–32)
AST: 25 IU/L (ref 0–40)
Albumin: 4.5 g/dL (ref 3.9–4.9)
Alkaline Phosphatase: 86 IU/L (ref 44–121)
BUN/Creatinine Ratio: 14 (ref 12–28)
BUN: 12 mg/dL (ref 8–27)
Bilirubin Total: 0.8 mg/dL (ref 0.0–1.2)
CO2: 21 mmol/L (ref 20–29)
Calcium: 9.5 mg/dL (ref 8.7–10.3)
Chloride: 102 mmol/L (ref 96–106)
Creatinine, Ser: 0.84 mg/dL (ref 0.57–1.00)
Globulin, Total: 2.6 g/dL (ref 1.5–4.5)
Glucose: 97 mg/dL (ref 70–99)
Potassium: 4.2 mmol/L (ref 3.5–5.2)
Sodium: 136 mmol/L (ref 134–144)
Total Protein: 7.1 g/dL (ref 6.0–8.5)
eGFR: 77 mL/min/{1.73_m2} (ref >59)

## 2023-03-25 LAB — LIPID PANEL
Chol/HDL Ratio: 3.2 ratio (ref 0.0–4.4)
Cholesterol, Total: 231 mg/dL — ABNORMAL HIGH (ref 100–199)
HDL: 72 mg/dL (ref >39)
LDL Chol Calc (NIH): 142 mg/dL — ABNORMAL HIGH (ref 0–99)
Triglycerides: 95 mg/dL (ref 0–149)
VLDL Cholesterol Cal: 17 mg/dL (ref 5–40)

## 2023-03-25 NOTE — Progress Notes (Signed)
 Complete physical exam  Patient: Amber Church   DOB: 11-13-1955   68 y.o. Female  MRN: [id number]  Subjective:    Chief Complaint  Patient presents with   Annual Exam    Fasting annual exam, had AWV 10/24 with Nickeah. Had flu/covid shots in Sept. Asking about getting RSV, should she wait until the fall vs getting now?     Amber Church is a 68 y.o. female who presents today for a complete physical exam.  She reports consuming a Weight Watchers diet. Gym/ health club routine includes 3 days a week, Silver Sneakers. She generally feels well. She reports sleeping fairly well.  She did lose roughly 50 pounds several years ago due to major medical issues requiring multiple hospitalizations.  Since then she has done a good job of keeping weight off and states that it has helped with her various arthritic problems including back and knee trouble.  She was given Creon to help with her complaints but however is rarely taking this.  Presently she is not on blood pressure or lipid medications mainly due to her weight loss of roughly 50 pounds.  She is retired.  Her home life is quite stable.  Most recent fall risk assessment:    10/08/2022    3:44 PM  Fall Risk   Falls in the past year? 0  Number falls in past yr: 0  Injury with Fall? 0  Risk for fall due to : Medication side effect  Follow up Falls prevention discussed;Falls evaluation completed     Most recent depression screenings:    10/08/2022    3:44 PM 10/02/2021   11:34 AM  PHQ 2/9 Scores  PHQ - 2 Score 3 0  PHQ- 9 Score 7     Vision:Not within last year     Immunization History  Administered Date(s) Administered   DTaP 03/13/1998   Influenza Split 10/22/2011, 10/21/2014   Influenza Whole 11/17/2008, 11/03/2010   Influenza, High Dose Seasonal PF 09/24/2021   Influenza,inj,Quad PF,6+ Mos 02/24/2014, 01/15/2016, 10/09/2016, 10/13/2017, 10/03/2018   Influenza-Unspecified 10/09/2016, 09/21/2022   Moderna  Sars-Covid-2 Vaccination 03/03/2019, 03/31/2019, 11/10/2019, 05/23/2020   PFIZER Comirnaty(Gray Top)Covid-19 Tri-Sucrose Vaccine 09/24/2021   PNEUMOCOCCAL CONJUGATE-20 10/15/2021   Tdap 08/06/2010, 08/18/2020   Unspecified SARS-COV-2 Vaccination 09/21/2022   Zoster Recombinant(Shingrix) 06/03/2017, 08/04/2017    Health Maintenance  Topic Date Due   Colonoscopy  03/17/2021   COVID-19 Vaccine (7 - Moderna risk 2024-25 season) 03/21/2023   Medicare Annual Wellness (AWV)  10/08/2023   MAMMOGRAM  12/13/2023   DTaP/Tdap/Td (4 - Td or Tdap) 08/19/2030   Pneumonia Vaccine 56+ Years old  Completed   INFLUENZA VACCINE  Completed   DEXA SCAN  Completed   Hepatitis C Screening  Completed   Zoster Vaccines- Shingrix  Completed   HPV VACCINES  Aged Out    Patient Care Team: Ronnald Nian, MD as PCP - General  Ortho-Dr. Lajoyce [facility]    Outpatient Medications Prior to Visit  Medication Sig Note   acetaminophen (TYLENOL) 650 MG CR tablet Take 650 mg by mouth every 8 (eight) hours as needed for pain or fever. (Patient not taking: Reported on 03/25/2023) 03/25/2023: As needed   lipase/protease/amylase (CREON) 36000 UNITS CPEP capsule Take 1 capsule (36,000 Units total) by mouth 3 (three) times daily before meals. (Patient not taking: Reported on 03/25/2023) 03/25/2023: As needed   [DISCONTINUED] lisinopril-hydrochlorothiazide (ZESTORETIC) 10-12.5 MG tablet Take 1 tablet by mouth daily. (Patient not taking:  Reported on 07/31/2021)    [DISCONTINUED] ondansetron (ZOFRAN) 4 MG tablet TAKE 1 TABLET BY MOUTH EVERY 8 HOURS AS NEEDED FOR NAUSEA AND VOMITING (Patient not taking: Reported on 03/25/2023)    [DISCONTINUED] pantoprazole (PROTONIX) 40 MG tablet Take 1 tablet (40 mg total) by mouth daily.    [DISCONTINUED] pravastatin (PRAVACHOL) 40 MG tablet Take 40 mg by mouth daily.    No facility-administered medications prior to visit.    Review of Systems  All other systems reviewed and  are negative.   Family and social history as well as health maintenance and immunizations was reviewed.     Objective:    BP 120/70   Pulse 68   Ht 5\' 3"  (1.6 m)   Wt 150 lb 9.6 oz (68.3 kg)   SpO2 96%   BMI 26.68 kg/m    Physical Exam   Alert and in no distress. Tympanic membranes and canals are normal. Pharyngeal area is normal. Neck is supple without adenopathy or thyromegaly. Cardiac exam shows a regular sinus rhythm without murmurs or gallops. Lungs are clear to auscultation.     Assessment & Plan:    Encounter for screening mammogram for malignant neoplasm of breast - Plan: MM Digital Screening  Routine general medical examination at a health care facility - Plan: CBC with Differential/Platelet, Comprehensive metabolic panel, Lipid panel  Hyperlipidemia LDL goal <100 - Plan: Lipid panel Although she lost 30 pounds due to medical illness it has had a positive effect on her health and she continues to take excellent care of herself.  Follow-up pending blood results. Return in about 1 year (around 03/24/2024).      Sharlot Gowda, MD

## 2023-03-26 ENCOUNTER — Encounter: Payer: Self-pay | Admitting: Family Medicine

## 2023-04-08 ENCOUNTER — Ambulatory Visit
Admission: RE | Admit: 2023-04-08 | Discharge: 2023-04-08 | Disposition: A | Discharge status: Home / Self Care | Source: Ambulatory Visit | Attending: Family Medicine | Admitting: Family Medicine

## 2023-04-08 DIAGNOSIS — Z1231 Encounter for screening mammogram for malignant neoplasm of breast: Secondary | ICD-10-CM | POA: Diagnosis not present

## 2023-07-01 ENCOUNTER — Ambulatory Visit: Admitting: Family Medicine

## 2023-07-01 VITALS — BP 122/88 | HR 76 | Wt 154.4 lb

## 2023-07-01 DIAGNOSIS — M79672 Pain in left foot: Secondary | ICD-10-CM

## 2023-07-01 NOTE — Progress Notes (Signed)
   Subjective:    Patient ID: Amber Church, female    DOB: [birth year], 68 y.o.   MRN: [id number]  HPI She is here for evaluation of left foot pain.  She has been involved in a new exercise program requiring a lot of lower body exercises.  Over the last day that she has had some swelling and pain over the lateral aspect of the left foot. She obtained a boot and states that when wearing this symptoms diminished.  Review of Systems     Objective:    Physical Exam Exam of the left foot does show some swelling but no erythema.  No tenderness over the head of the fifth metatarsal but her 3rd and 4th metatarsal close tenderness when palpated from the plantar surface.  Good motion of the ankle.       Assessment & Plan:  Left foot pain I explained that this could possibly be a stress fracture but at this time the therapy would be the same which is to use the boot for comfortlisten to your body.  If it hurts do not do it.  Shoes that have good arch supports.  Back off on doing the exercises that make it hurt especially jumping types .  You can take 2 Aleve twice per day to help with the discomfort if you need it.  You can use the boot for comfort the next couple weeks if something changes leave a message on MyChart and we will either see or discuss it at that time

## 2023-07-01 NOTE — Patient Instructions (Addendum)
 listen to your body.  If it hurts do not do it.  Shoes that have good arch supports.  Back off on doing the exercises that make it hurt especially jumping types .  You can take 2 Aleve twice per day to help with the discomfort if you need it.  You can use the boot for comfort the next couple weeks if something changes leave a message on MyChart and we will either see or discuss it at that time

## 2023-07-02 ENCOUNTER — Ambulatory Visit: Admitting: Family Medicine

## 2023-08-04 DIAGNOSIS — Z860101 Personal history of adenomatous and serrated colon polyps: Secondary | ICD-10-CM | POA: Diagnosis not present

## 2023-08-04 DIAGNOSIS — Z09 Encounter for follow-up examination after completed treatment for conditions other than malignant neoplasm: Secondary | ICD-10-CM | POA: Diagnosis not present

## 2023-08-04 DIAGNOSIS — K5730 Diverticulosis of large intestine without perforation or abscess without bleeding: Secondary | ICD-10-CM | POA: Diagnosis not present

## 2023-08-04 DIAGNOSIS — D123 Benign neoplasm of transverse colon: Secondary | ICD-10-CM | POA: Diagnosis not present

## 2023-08-04 DIAGNOSIS — K649 Unspecified hemorrhoids: Secondary | ICD-10-CM | POA: Diagnosis not present

## 2023-08-04 LAB — HM COLONOSCOPY

## 2023-08-06 ENCOUNTER — Ambulatory Visit: Payer: Self-pay

## 2023-08-06 NOTE — Telephone Encounter (Signed)
 FYI Only or Action Required?: FYI only for provider.  Patient was last seen in primary care on 07/01/2023 by Joyce Norleen BROCKS, MD.  Called Nurse Triage reporting Poison Ivy and Rash.  Symptoms began several days ago.  Interventions attempted: OTC medications: Calamine lotion and Rest, hydration, or home remedies.  Symptoms are: unchanged.  Triage Disposition: See Physician Within 24 Hours  Patient/caregiver understands and will follow disposition?: Yes  Reason for Disposition  MODERATE to SEVERE itching (e.g., interferes with work, school, sleep, or other activities)  Answer Assessment - Initial Assessment Questions 1. APPEARANCE of RASH: What does the rash look like?      Small red blotches that grew on both legs. Red puffy rash to arms. 2. LOCATION: Where is the rash located?  (e.g., face, genitals, hands, legs)     legs 3. SIZE: How large is the rash?      Large areas 4. ONSET: When did the rash begin?      Started five days ago 5. ITCHING: Does the rash itch? If Yes, ask: How bad is it?     Itching-mild to moderate 6. EXPOSURE:  How were you exposed to the plant (poison ivy, poison oak, sumac)  When were you exposed?  Note: Sometimes a poison ivy/oak/sumac rash does not appear for 2 to 3 weeks after exposure.      Patient believes she was exposed on this to poison ivy or poison oak in her house 7. PAST HISTORY: Have you had a poison ivy rash before? If Yes, ask: How bad was it?     yes 8. OTHER SYMPTOMS: Do you have any other symptoms? (e.g., fever)      no Calamine lotion,  Protocols used: Poison Ivy - Oak - Sumac-A-AH

## 2023-08-06 NOTE — Telephone Encounter (Signed)
 This RN made first attempt to triage patient. No answer, LVM. Routing for additional attempts.   Copied from CRM [id number]. Topic: Clinical - Medication Question >> Aug 06, 2023  2:19 PM Amber Church wrote: Reason for CRM: patient called stated she has poison ivy on both calves and both arms. She stated her eyes started itching today. She doesn'Church have an outbreak on her face or around her eyes. She is asking for a medication to be called in for the itching as she is not able to sleep at night. Patient says she has been using some over the counter medication but its not working.

## 2023-08-07 ENCOUNTER — Ambulatory Visit (INDEPENDENT_AMBULATORY_CARE_PROVIDER_SITE_OTHER): Admitting: Medical

## 2023-08-07 VITALS — BP 110/72 | HR 64 | Temp 97.2°F | Wt 155.6 lb

## 2023-08-07 DIAGNOSIS — L237 Allergic contact dermatitis due to plants, except food: Secondary | ICD-10-CM | POA: Diagnosis not present

## 2023-08-07 DIAGNOSIS — R21 Rash and other nonspecific skin eruption: Secondary | ICD-10-CM

## 2023-08-07 MED ORDER — METHYLPREDNISOLONE ACETATE 40 MG/ML IJ SUSP
40.0000 mg | Freq: Once | INTRAMUSCULAR | Status: AC
  Administered 2023-08-07: 40 mg via INTRAMUSCULAR

## 2023-08-07 MED ORDER — TRIAMCINOLONE ACETONIDE 0.1 % EX CREA
1.0000 | TOPICAL_CREAM | Freq: Two times a day (BID) | CUTANEOUS | 0 refills | Status: DC
Start: 2023-08-07 — End: 2023-10-06

## 2023-08-07 MED ORDER — HYDROXYZINE HCL 10 MG PO TABS: 10.0000 mg | ORAL_TABLET | Freq: Two times a day (BID) | ORAL | 0 refills | Status: DC

## 2023-08-07 NOTE — Progress Notes (Signed)
 Subjective:   Amber Church is a 68 y.o. female who presents for evaluation of a rash involving the arms and legs.  Concerned for poison ivy as they have recent exposure - was moving some brush around last week.   Rash started 8 days ago.  Rash is itchy, red, raised.  Patient denies: fever, joint aches, chills, nausea. Patient has not had contacts with similar rash.   Using calamine lotion, anti-itch cream for the rash.  No other aggravating or relieving factors.  No other c/o.   The following portions of the patient's history were reviewed and updated as appropriate: allergies, current medications, past family history, past medical history, past social history and problem list.  Review of Systems As in subjective above   Objective:   Gen: wd, wn, nad Skin: Left lower leg medially with several papular erythematous lesions to 3 mm diameter, right lower leg with a scattering of urticarial papular and linear lesions, right medial elbow with about a 4 cm patch of pinkish urticarial appearing skin and left wrist volar with some linear urticarial markings   Assessment:   Encounter Diagnoses  Name Primary?   Poison ivy dermatitis Yes   Rash       Plan:   Discussed symptoms and exam findings, diagnosis, treatment options.  Etiology appears to be poison ivy dermatitis.  40 mg Depo-Medrol  given IM in office.   Prescribed: Triamcinolone  steroid cream to apply topically daily for the next several days until this resolves.  Discussed washing contaminated clothing on the hot cycle in the washing machine, washing gloves, shoes, or other utensils in hot soapy water .  Avoid re- exposure.  Discussed signs of infection or worsening symptoms that would prompt recheck.   Advised hydroxyzine  1 tablet po up to BID for the next few days for symptoms   [name] was seen today for acute visit.  Diagnoses and all orders for this visit:  Poison ivy dermatitis -     methylPREDNISolone  acetate  (DEPO-MEDROL ) injection 40 mg  Rash -     methylPREDNISolone  acetate (DEPO-MEDROL ) injection 40 mg  Other orders -     triamcinolone  cream (KENALOG ) 0.1 %; Apply 1 Application topically 2 (two) times daily. -     hydrOXYzine  (ATARAX ) 10 MG tablet; Take 1 tablet (10 mg total) by mouth in the morning and at bedtime. For itching and rash     Follow up prn, or if not much improved in the next 4-5 days.

## 2023-08-11 DIAGNOSIS — Z860101 Personal history of adenomatous and serrated colon polyps: Secondary | ICD-10-CM | POA: Insufficient documentation

## 2023-08-20 ENCOUNTER — Other Ambulatory Visit: Payer: Self-pay | Admitting: Medical

## 2023-09-10 ENCOUNTER — Encounter: Payer: Self-pay | Admitting: Family

## 2023-09-10 ENCOUNTER — Ambulatory Visit: Admitting: Family

## 2023-09-10 ENCOUNTER — Other Ambulatory Visit (INDEPENDENT_AMBULATORY_CARE_PROVIDER_SITE_OTHER)

## 2023-09-10 DIAGNOSIS — M25511 Pain in right shoulder: Secondary | ICD-10-CM | POA: Diagnosis not present

## 2023-09-10 MED ORDER — LIDOCAINE HCL 1 % IJ SOLN
5.0000 mL | INTRAMUSCULAR | Status: AC | PRN
Start: 2023-09-10 — End: 2023-09-10
  Administered 2023-09-10: 5 mL

## 2023-09-10 MED ORDER — METHYLPREDNISOLONE ACETATE 40 MG/ML IJ SUSP
40.0000 mg | INTRAMUSCULAR | Status: AC | PRN
Start: 2023-09-10 — End: 2023-09-10
  Administered 2023-09-10: 40 mg via INTRA_ARTICULAR

## 2023-09-10 NOTE — Progress Notes (Signed)
 Office Visit Note   Patient: Amber Church           Date of Birth: June 24, 1955           MRN: [id number] Visit Date: 09/10/2023              Requested by: Joyce Norleen BROCKS, MD [address] [place],  [place] [address] PCP: Joyce Norleen BROCKS, MD  Chief Complaint  Patient presents with   Right Shoulder - Injury      HPI: The patient is a 68 year old woman who presents today complaining of right shoulder pain after she was running with her grandchildren she fell on her right side she is unsure whether she fell on an outstretched arm or with direct impact on the shoulder she has had constant pain since last Saturday she has some pain with above head and behind back reaching  Has a history of rotator cuff injury on the left with repair she states this feels similar to that  She has not used ibuprofen  or Aleve  Assessment & Plan: Visit Diagnoses:  1. Acute pain of right shoulder     Plan: Depo-Medrol  injection right shoulder.  Patient tolerated well.  Given an rotator cuff exercise and strengthening handout she will work on a home exercise program  Follow-Up Instructions: No follow-ups on file.   Right Shoulder Exam   Tenderness  The patient is experiencing tenderness in the biceps tendon.  Range of Motion  Active abduction:  70  Forward flexion:  80   Tests  Drop arm: negative  Other  Erythema: absent Sensation: normal Pulse: present      Patient is alert, oriented, no adenopathy, well-dressed, normal affect, normal respiratory effort.  Imaging: No results found. No images are attached to the encounter.  Labs: No results found for: HGBA1C, ESRSEDRATE, CRP, LABURIC, REPTSTATUS, GRAMSTAIN, CULT, LABORGA   Lab Results  Component Value Date   ALBUMIN 4.5 03/25/2023   ALBUMIN 4.2 10/02/2021   ALBUMIN 3.8 (L) 07/31/2021    Lab Results  Component Value Date   MG 2.0 06/01/2021   MG 2.0 05/21/2021   MG 2.2 05/18/2021   No  results found for: VD25OH  No results found for: PREALBUMIN    Latest Ref Rng & Units 03/25/2023   11:34 AM 10/02/2021    1:34 PM 07/31/2021   11:45 AM  CBC EXTENDED  WBC 3.4 - 10.8 x10E3/uL 6.5  6.6  7.8   RBC 3.77 - 5.28 x10E6/uL 4.92  4.89  3.92   Hemoglobin 11.1 - 15.9 g/dL 85.2  86.7  89.0   HCT 34.0 - 46.6 % 44.1  41.0  33.6   Platelets 150 - 450 x10E3/uL 303  350  503   NEUT# 1.4 - 7.0 x10E3/uL 3.9  3.3  4.5   Lymph# 0.7 - 3.1 x10E3/uL 2.2  2.7  2.5      There is no height or weight on file to calculate BMI.  Orders:  Orders Placed This Encounter  Procedures   XR Shoulder Right   No orders of the defined types were placed in this encounter.    Procedures: Large Joint Inj: R subacromial bursa on 09/10/2023 2:13 PM Indications: pain Details: 22 G 1.5 in needle Medications: 5 mL lidocaine  1 %; 40 mg methylPREDNISolone  acetate 40 MG/ML Consent was given by the patient.      Clinical Data: No additional findings.  ROS:  All other systems negative, except as noted in the HPI. Review  of Systems  Objective: Vital Signs: There were no vitals taken for this visit.  Specialty Comments:  No specialty comments available.  PMFS History: Patient Active Problem List   Diagnosis Date Noted   Hx of adenomatous colonic polyps 08/11/2023   Pancreatic pseudocyst 05/31/2021   Polycythemia 05/16/2021   Spinal stenosis of lumbar region 04/04/2020   S/P TKR (total knee replacement), right 10/02/2018   Hypertension 08/06/2010   Hyperlipidemia LDL goal <100 08/06/2010   Past Medical History:  Diagnosis Date   Abnormal EKG    Abnormal pap    s/p cryotherapy   Anxiety    Arthritis    Depression    Diverticulosis    GERD (gastroesophageal reflux disease)    Hyperlipidemia    Hypertension    S/P colonoscopic polypectomy     Family History  Problem Relation Age of Onset   Alzheimer's disease Mother    Hyperlipidemia Mother    Other Father        died of  gunshot wound   Breast cancer Sister 33   Osteoporosis Sister    Osteoporosis Sister    ALS Maternal Aunt    ALS Maternal Uncle    Diabetes Paternal Aunt    Cancer Maternal Grandfather        lung cancer   Cancer Paternal Grandmother        ?type   Heart disease Neg Hx    BRCA 1/2 Neg Hx     Past Surgical History:  Procedure Laterality Date   CHOLECYSTECTOMY N/A 05/18/2021   Procedure: LAPAROSCOPIC CHOLECYSTECTOMY;  Surgeon: Evonnie Dorothyann LABOR, DO;  Location: [facility];  Service: General;  Laterality: N/A;   COLONOSCOPY  03/18/11; 2000   Dr. Dyane; diverticulosis in sigmoid colon   crysurgery  age 58   for abnormal pap   LAMINECTOMY  03/2002   SPINE SURGERY     TOTAL KNEE ARTHROPLASTY Right 10/02/2018   TOTAL KNEE ARTHROPLASTY Right 10/02/2018   Procedure: RIGHT TOTAL KNEE ARTHROPLASTY;  Surgeon: Harden Jerona GAILS, MD;  Location: [facility];  Service: Orthopedics;  Laterality: Right;   Social History   Occupational History   Occupation: Psychologist, educational: [employer]  Tobacco Use   Smoking status: Never   Smokeless tobacco: Never  Vaping Use   Vaping status: Never Used  Substance and Sexual Activity   Alcohol use: Yes    Comment: glass of wine 1-2 times per week.   Drug use: No   Sexual activity: Yes    Partners: Male    Comment: postmenopausal

## 2023-10-01 ENCOUNTER — Ambulatory Visit: Admitting: Family

## 2023-10-01 ENCOUNTER — Telehealth: Payer: Self-pay | Admitting: Family

## 2023-10-01 DIAGNOSIS — M25511 Pain in right shoulder: Secondary | ICD-10-CM | POA: Diagnosis not present

## 2023-10-01 NOTE — Telephone Encounter (Signed)
 Pt called stating Rocky was to send in a script to [facility]. Please send ASAP. Pt phone number is [phone].

## 2023-10-01 NOTE — Progress Notes (Signed)
 Office Visit Note   Patient: Amber Church           Date of Birth: [birth year]           MRN: [id number] Visit Date: 10/01/2023              Requested by: Vita Morrow, MD [address] [place],  [place] [address] PCP: Vita Morrow, MD  Chief Complaint  Patient presents with   Right Shoulder - Pain      HPI: The patient is a 68 year old woman who presents in follow-up for right shoulder pain after falling on an outstretched arm on the right side she may have also had direct impact on her shoulder.  She has had some reduction in her pain since she was last seen about 4 weeks ago.  She reports she is about 25% better Depo-Medrol  injection at last visit.  She continues to have significant pain and difficulty with above head and behind back reaching.  Assessment & Plan: Visit Diagnoses: No diagnosis found.  Plan: will proceed with MRI right shoulder. Offered antiinflammatory orally as well as muscle relaxer. Follow up after mri.  Follow-Up Instructions: No follow-ups on file.   Right Shoulder Exam   Tenderness  The patient is experiencing tenderness in the biceps tendon.  Range of Motion  Active abduction:  90  Forward flexion:  80   Muscle Strength  The patient has normal right shoulder strength.  Tests  Impingement: positive Drop arm: negative  Other  Erythema: absent Pulse: present      Patient is alert, oriented, no adenopathy, well-dressed, normal affect, normal respiratory effort.     Imaging: No results found. No images are attached to the encounter.  Labs: No results found for: HGBA1C, ESRSEDRATE, CRP, LABURIC, REPTSTATUS, GRAMSTAIN, CULT, LABORGA   Lab Results  Component Value Date   ALBUMIN 4.5 03/25/2023   ALBUMIN 4.2 10/02/2021   ALBUMIN 3.8 (L) 07/31/2021    Lab Results  Component Value Date   MG 2.0 06/01/2021   MG 2.0 05/21/2021   MG 2.2 05/18/2021   No results found for: VD25OH  No results found  for: PREALBUMIN    Latest Ref Rng & Units 03/25/2023   11:34 AM 10/02/2021    1:34 PM 07/31/2021   11:45 AM  CBC EXTENDED  WBC 3.4 - 10.8 x10E3/uL 6.5  6.6  7.8   RBC 3.77 - 5.28 x10E6/uL 4.92  4.89  3.92   Hemoglobin 11.1 - 15.9 g/dL 85.2  86.7  89.0   HCT 34.0 - 46.6 % 44.1  41.0  33.6   Platelets 150 - 450 x10E3/uL 303  350  503   NEUT# 1.4 - 7.0 x10E3/uL 3.9  3.3  4.5   Lymph# 0.7 - 3.1 x10E3/uL 2.2  2.7  2.5      There is no height or weight on file to calculate BMI.  Orders:  No orders of the defined types were placed in this encounter.  No orders of the defined types were placed in this encounter.    Procedures: No procedures performed  Clinical Data: No additional findings.  ROS:  All other systems negative, except as noted in the HPI. Review of Systems  Objective: Vital Signs: There were no vitals taken for this visit.  Specialty Comments:  No specialty comments available.  PMFS History: Patient Active Problem List   Diagnosis Date Noted   Hx of adenomatous colonic polyps 08/11/2023   Pancreatic pseudocyst 05/31/2021   Polycythemia  05/16/2021   Spinal stenosis of lumbar region 04/04/2020   S/P TKR (total knee replacement), right 10/02/2018   Hypertension 08/06/2010   Hyperlipidemia LDL goal <100 08/06/2010   Past Medical History:  Diagnosis Date   Abnormal EKG    Abnormal pap    s/p cryotherapy   Anxiety    Arthritis    Depression    Diverticulosis    GERD (gastroesophageal reflux disease)    Hyperlipidemia    Hypertension    S/P colonoscopic polypectomy     Family History  Problem Relation Age of Onset   Alzheimer's disease Mother    Hyperlipidemia Mother    Other Father        died of gunshot wound   Breast cancer Sister 30   Osteoporosis Sister    Osteoporosis Sister    ALS Maternal Aunt    ALS Maternal Uncle    Diabetes Paternal Aunt    Cancer Maternal Grandfather        lung cancer   Cancer Paternal Grandmother         ?type   Heart disease Neg Hx    BRCA 1/2 Neg Hx     Past Surgical History:  Procedure Laterality Date   CHOLECYSTECTOMY N/A 05/18/2021   Procedure: LAPAROSCOPIC CHOLECYSTECTOMY;  Surgeon: Evonnie Dorothyann LABOR, DO;  Location: [facility];  Service: General;  Laterality: N/A;   COLONOSCOPY  03/18/11; 2000   Dr. Dyane; diverticulosis in sigmoid colon   crysurgery  age 9   for abnormal pap   LAMINECTOMY  03/2002   SPINE SURGERY     TOTAL KNEE ARTHROPLASTY Right 10/02/2018   TOTAL KNEE ARTHROPLASTY Right 10/02/2018   Procedure: RIGHT TOTAL KNEE ARTHROPLASTY;  Surgeon: Harden Jerona GAILS, MD;  Location: [facility];  Service: Orthopedics;  Laterality: Right;   Social History   Occupational History   Occupation: Psychologist, educational: [employer]  Tobacco Use   Smoking status: Never   Smokeless tobacco: Never  Vaping Use   Vaping status: Never Used  Substance and Sexual Activity   Alcohol use: Yes    Comment: glass of wine 1-2 times per week.   Drug use: No   Sexual activity: Yes    Partners: Male    Comment: postmenopausal

## 2023-10-03 MED ORDER — METHOCARBAMOL 500 MG PO TABS: 500.0000 mg | ORAL_TABLET | Freq: Four times a day (QID) | ORAL | 0 refills | Status: DC | PRN

## 2023-10-03 MED ORDER — DICLOFENAC SODIUM 75 MG PO TBEC: 75.0000 mg | DELAYED_RELEASE_TABLET | Freq: Two times a day (BID) | ORAL | 2 refills | Status: DC

## 2023-10-06 ENCOUNTER — Encounter: Payer: Self-pay | Admitting: Family Medicine

## 2023-10-06 ENCOUNTER — Telehealth: Payer: Self-pay | Admitting: Family

## 2023-10-06 ENCOUNTER — Ambulatory Visit (INDEPENDENT_AMBULATORY_CARE_PROVIDER_SITE_OTHER): Admitting: Family Medicine

## 2023-10-06 VITALS — BP 128/88 | HR 69 | Ht 64.0 in | Wt 158.4 lb

## 2023-10-06 DIAGNOSIS — Z23 Encounter for immunization: Secondary | ICD-10-CM

## 2023-10-06 DIAGNOSIS — G8929 Other chronic pain: Secondary | ICD-10-CM | POA: Diagnosis not present

## 2023-10-06 DIAGNOSIS — Z0000 Encounter for general adult medical examination without abnormal findings: Secondary | ICD-10-CM | POA: Diagnosis not present

## 2023-10-06 DIAGNOSIS — M25511 Pain in right shoulder: Secondary | ICD-10-CM

## 2023-10-06 DIAGNOSIS — L237 Allergic contact dermatitis due to plants, except food: Secondary | ICD-10-CM

## 2023-10-06 MED ORDER — TRIAMCINOLONE ACETONIDE 0.1 % EX CREA: 1.0000 | TOPICAL_CREAM | Freq: Two times a day (BID) | CUTANEOUS | 0 refills | Status: DC

## 2023-10-06 NOTE — Progress Notes (Addendum)
 Name: Amber Church   Date of Visit: 10/06/23   Date of last visit with me: Visit date not found   CHIEF COMPLAINT:  Chief Complaint  Patient presents with   Annual Exam    Cpe, awv, fasting.        HPI:  Discussed the use of AI scribe software for clinical note transcription with the patient, who gave verbal consent to proceed.  History of Present Illness   Amber Church is a 68 year old female who presents with shoulder pain following a fall.  She has been experiencing shoulder pain for approximately one month after falling while chasing her grandchildren. She landed on her side and suspects a small tear in the rotator cuff. Initially, the pain was significant, prompting a visit to a physician assistant last week due to increased discomfort. However, the pain has been improving over the past week and weekend.  She has a history of a similar shoulder issue years ago, which resolved gradually without intervention. She is concerned that the current recovery is taking longer than her previous experience.  An MRI was scheduled to assess the shoulder, but she is considering canceling it due to the recent improvement in symptoms.  She mentions a need for a refill of an anti-itch cream, specifically Kenalog , which she found effective for a previous episode of poison ivy.  She notes a recent weight gain attributed to decreased physical activity following the fall, although she had been actively participating in exercise at the [facility] prior to the injury.         OBJECTIVE:       10/06/2023    8:37 AM  Depression screen PHQ 2/9  Decreased Interest 1  Down, Depressed, Hopeless 0  PHQ - 2 Score 1     BP Readings from Last 3 Encounters:  10/06/23 128/88  08/07/23 110/72  07/01/23 122/88    BP 128/88   Pulse 69   Ht 5' 4 (1.626 m)   Wt 158 lb 6.4 oz (71.8 kg)   SpO2 98%   BMI 27.19 kg/m    Physical Exam          Physical Exam Constitutional:       Appearance: Normal appearance.  Cardiovascular:     Rate and Rhythm: Normal rate.  Neurological:     General: No focal deficit present.     Mental Status: She is alert and oriented to person, place, and time. Mental status is at baseline.     ASSESSMENT/PLAN:   Assessment & Plan Encounter for Medicare annual wellness exam  Chronic right shoulder pain  Poison ivy dermatitis    Assessment and Plan    Medicare Annual Wellness exam - Flu and Covid vaccine today - Labs are up to date, no need for extra labs - Up to date on all screenings.   Right shoulder pain Suspected small rotator cuff tear, improving. MRI canceled due to improvement and cost concerns. Ultrasound available if needed. MRI only if large tear suspected and surgery planned. - Cancel MRI for right shoulder. - Provide home exercise sheet for shoulder strengthening. - Advise to return if shoulder pain flares up for ultrasound assessment. - Discuss potential for steroid injections if pain persists.  Poison ivy dermatitis Recurrence of symptoms, previously treated with Kenalog  cream. - Prescribe Kenalog  cream for poison ivy dermatitis.      Total time spent on the date of the encounter was 34 minutes, which included reviewing the patient's  chart, performing a history and physical exam, ordering and reviewing studies, coordinating care, and counseling the patient regarding diagnosis and treatment options.  We discussed advance care planning that spent 17 minutes going over the issues as well as providing paperwork to the patient.  Patient will bring the paperwork back next time and discuss in further what she would like to do.  The time spent was medically necessary and supports billing based on total time.  Eriona Kinchen A. Vita MD [facility]

## 2023-10-06 NOTE — Telephone Encounter (Signed)
 Patient called. Says she will cxl the MRI. She is doing better.

## 2023-10-07 ENCOUNTER — Encounter: Payer: Self-pay | Admitting: Family

## 2023-10-07 NOTE — Telephone Encounter (Signed)
 Do you mind cancelling her mri

## 2023-10-07 NOTE — Telephone Encounter (Signed)
 This has been cancelled.

## 2023-10-08 ENCOUNTER — Ambulatory Visit (HOSPITAL_COMMUNITY)

## 2023-10-22 ENCOUNTER — Other Ambulatory Visit: Payer: Self-pay | Admitting: Family Medicine

## 2023-10-22 NOTE — Telephone Encounter (Signed)
   This was not refilled by us . It came from [facility]

## 2023-10-22 NOTE — Telephone Encounter (Signed)
 Copied from CRM #[id number]. Topic: Clinical - Medication Refill >> Oct 22, 2023 10:56 AM Willma R wrote: Medication: methocarbamol  (ROBAXIN ) 500 MG tablet (States Dr Vita advised would refill at her last appointment if needed)  Has the patient contacted their pharmacy? Yes, call dr  This is the patient's preferred pharmacy:  [facility] - [place], [place] - [address] [place] [address] Phone: [phone] Fax: [phone]  Is this the correct pharmacy for this prescription? Yes If no, delete pharmacy and type the correct one.   Has the prescription been filled recently? No  Is the patient out of the medication? Yes  Has the patient been seen for an appointment in the last year OR does the patient have an upcoming appointment? Yes  Can we respond through MyChart? Yes  Agent: Please be advised that Rx refills may take up to 3 business days. We ask that you follow-up with your pharmacy.

## 2023-10-30 ENCOUNTER — Encounter

## 2023-11-05 ENCOUNTER — Ambulatory Visit (INDEPENDENT_AMBULATORY_CARE_PROVIDER_SITE_OTHER): Admitting: Family Medicine

## 2023-11-05 ENCOUNTER — Encounter: Payer: Self-pay | Admitting: Family Medicine

## 2023-11-05 ENCOUNTER — Ambulatory Visit: Payer: Self-pay

## 2023-11-05 VITALS — BP 116/76 | HR 93 | Wt 153.2 lb

## 2023-11-05 DIAGNOSIS — S46311A Strain of muscle, fascia and tendon of triceps, right arm, initial encounter: Secondary | ICD-10-CM

## 2023-11-05 DIAGNOSIS — G8929 Other chronic pain: Secondary | ICD-10-CM

## 2023-11-05 DIAGNOSIS — M25511 Pain in right shoulder: Secondary | ICD-10-CM | POA: Diagnosis not present

## 2023-11-05 MED ORDER — MELOXICAM 15 MG PO TABS: 15.0000 mg | ORAL_TABLET | Freq: Every day | ORAL | 0 refills | Status: DC

## 2023-11-05 NOTE — Telephone Encounter (Signed)
 FYI Only or Action Required?: FYI only for provider: appointment scheduled on 10/29.  Patient was last seen in primary care on 10/06/2023 by Vita Morrow, MD.  Called Nurse Triage reporting Shoulder Pain.  Symptoms began several months ago.  Symptoms are: gradually worsening.  Triage Disposition: See PCP Within 2 Weeks  Patient/caregiver understands and will follow disposition?: Yes     Copied from CRM [id number]. Topic: Clinical - Red Word Triage >> Nov 05, 2023 10:09 AM Hadassah PARAS wrote: Red Word that prompted transfer to Nurse Triage: rotator cuff not recovering correctly; painful at night; worsening pain       Reason for Disposition  Shoulder pain is a chronic symptom (recurrent or ongoing AND present > 4 weeks)  Answer Assessment - Initial Assessment Questions 1. ONSET: When did the pain start?     A couple of months ago 2. LOCATION: Where is the pain located?     Right shoulder  3. PAIN: How bad is the pain? (Scale 1-10; or mild, moderate, severe)     Moderate  4. WORK OR EXERCISE: Has there been any recent work or exercise that involved this part of the body?     No 5. CAUSE: What do you think is causing the shoulder pain?     Fall 6. OTHER SYMPTOMS: Do you have any other symptoms? (e.g., neck pain, swelling, rash, fever, numbness, weakness)     Difficulty sleeping due to pain  Protocols used: Shoulder Pain-A-AH

## 2023-11-05 NOTE — Patient Instructions (Signed)
 Please go get your xrays done at [facility]. You do not need to make an appointment. You can just show up.   Address: [address] [place], [place], [place] [address]

## 2023-11-06 NOTE — Progress Notes (Signed)
 Name: [name]   Date of Visit: 11/06/23   Date of last visit with me: 10/22/2023   CHIEF COMPLAINT:  Chief Complaint  Patient presents with   Acute Visit    Right shoulder pain. Going down to above elbow on back side. No elbow pain.        HPI:  Discussed the use of AI scribe software for clinical note transcription with the patient, who gave verbal consent to proceed.  History of Present Illness   Chantale LASHAN MACIAS is a 68 year old female who presents with right arm pain and difficulty sleeping.  She experiences pain extending from the shoulder down the arm, described as a 'hurt pain' rather than numbness or tingling. The patient reports pain extending from the shoulder down the arm, but does not experience pain into her hands. It is particularly bothersome at night, affecting her sleep, and she has to position her arm on a pillow to alleviate the discomfort. The pain is worse than during a previous similar injury to the same arm.  She cares for her three grandchildren, aged three, two, and four months, which involves lifting them, particularly the youngest. She describes having to lift them over her head to place them on a swing, which she feels may have exacerbated her symptoms.  No recent injury to her elbow as per her recollection. No pain radiating to her fingertips.  She uses Voltaren  rub for arthritis in her knees, which have improved with weight loss. She previously had a knee replacement and reports that walking has become easier.         OBJECTIVE:       10/06/2023    8:37 AM  Depression screen PHQ 2/9  Decreased Interest 1  Down, Depressed, Hopeless 0  PHQ - 2 Score 1     BP Readings from Last 3 Encounters:  11/05/23 116/76  10/06/23 128/88  08/07/23 110/72    BP 116/76   Pulse 93   Wt 153 lb 3.2 oz (69.5 kg)   SpO2 97%   BMI 26.30 kg/m    Ultrasound Triceps Right: Ultrasound of the right forearm and tricep shows some mild thickening  of the triceps tendon.  There is also some noted thickening of the triceps insertion on the olecranon process of the ulna.  No signs of any acute tears.  Impression: Triceps tendon thickening consistent with triceps tendinitis.       Physical Exam  Significant tenderness to palpation along the triceps muscle as well as its distal insertion on the olecranon of the ulna.  Range of motion is full though there is some pain noted with extension against resistance. ASSESSMENT/PLAN:   Assessment & Plan Strain of right triceps, initial encounter  Chronic right shoulder pain    Assessment and Plan    Right triceps muscle strain Right triceps strain likely due to lifting. Pain localized to triceps with tendon thickening and inflammation.  - Prescribed meloxicam for two weeks, then as needed. - Instructed to ice triceps twice daily for 15 minutes. - Advised to avoid ibuprofen  or Aleve while on meloxicam. - Instructed to take meloxicam with food. - If no improvement in one week, contact provider for possible steroid prescription. - Advised on lifting techniques to minimize strain. - Ordered x-rays of the neck to rule out cervical spine involvement.  Right shoulder pain Right shoulder pain improved. Focus on strengthening to prevent triceps strain. - Provided shoulder exercises to strengthen shoulder. - Instructed  to start exercises next week as tolerated.         Navada Osterhout A. Vita MD [facility]

## 2023-11-10 ENCOUNTER — Encounter: Payer: Self-pay | Admitting: Radiology

## 2023-11-25 ENCOUNTER — Ambulatory Visit
Admission: RE | Admit: 2023-11-25 | Discharge: 2023-11-25 | Disposition: A | Discharge status: Home / Self Care | Source: Ambulatory Visit | Attending: Family Medicine | Admitting: Family Medicine

## 2023-11-25 DIAGNOSIS — G8929 Other chronic pain: Secondary | ICD-10-CM

## 2023-11-25 DIAGNOSIS — S46311A Strain of muscle, fascia and tendon of triceps, right arm, initial encounter: Secondary | ICD-10-CM

## 2023-12-01 ENCOUNTER — Other Ambulatory Visit (INDEPENDENT_AMBULATORY_CARE_PROVIDER_SITE_OTHER): Payer: Self-pay

## 2023-12-01 ENCOUNTER — Ambulatory Visit: Admitting: Family Medicine

## 2023-12-01 VITALS — BP 132/80 | HR 80 | Wt 154.0 lb

## 2023-12-01 DIAGNOSIS — M25511 Pain in right shoulder: Secondary | ICD-10-CM

## 2023-12-01 DIAGNOSIS — G8929 Other chronic pain: Secondary | ICD-10-CM | POA: Diagnosis not present

## 2023-12-01 NOTE — Progress Notes (Signed)
 Name: Amber Church   Date of Visit: 12/01/23   Date of last visit with me: 11/05/2023   CHIEF COMPLAINT:  Chief Complaint  Patient presents with   Acute Visit    Right shoulder pain. Gotten a little better, would like to do an injection.        HPI:  Discussed the use of AI scribe software for clinical note transcription with the patient, who gave verbal consent to proceed.  History of Present Illness   Amber Church is a 68 year old female with a partial rotator cuff tear who presents with shoulder pain.  She experiences persistent shoulder pain, which she describes as 'a little bit better' and 'softer' than before, though still present. The pain is primarily located in the shoulder and sometimes radiates to the front and outside of the arm. It is exacerbated by certain activities, such as sleeping in certain positions and picking up her grandchildren, though it is not as severe as it was previously.  The pain varies, sometimes affecting the area between the triceps and the shoulder. She has experienced some relief in the triceps area, but the shoulder and biceps pain persists. She has not had a steroid injection before, though she has taken steroids in the past.  Her current symptoms include shoulder pain that sometimes radiates to the front and outside of the arm, with occasional burning sensations in the distal triceps area. The pain has been ongoing and fluctuates in intensity, sometimes improving slightly on its own.  She is actively involved in caring for her grandchildren, which impacts her shoulder pain.         OBJECTIVE:       10/06/2023    8:37 AM  Depression screen PHQ 2/9  Decreased Interest 1  Down, Depressed, Hopeless 0  PHQ - 2 Score 1     BP Readings from Last 3 Encounters:  12/01/23 132/80  11/05/23 116/76  10/06/23 128/88    BP 132/80   Pulse 80   Wt 154 lb (69.9 kg)   SpO2 98%   BMI 26.43 kg/m     Physical Exam  TTP over  biceps tendon in groove. ROM full with pain noted with abduction against resistance. Negative Hawkins. Positive emptycan.  ASSESSMENT/PLAN:   Assessment & Plan Chronic right shoulder pain    Assessment and Plan    Right shoulder pain due to partial rotator cuff tear, biceps tendinitis,  with distal triceps insertion pain Chronic right shoulder pain with partial rotator cuff tear, biceps tendinitis, and triceps pain. Distal triceps pain improving but persists with certain movements and sleeping positions. Shoulder pain is worse now, suspected rotator cuff tear explains intermittent pain. 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 - Monitor pain relief and improvement over the next two weeks. - Consider additional steroid injection for distal triceps if pain persists.  Cervical spondylosis with radiculopathy Degenerative changes in cervical spine with radiculopathy. Symptoms primarily from shoulder and biceps issues. - Monitor symptoms and reassess if pain persists or worsens.      US -guided glenohumeral joint injection, right shoulder After discussion on risks/benefits/indications, informed verbal consent was obtained. A timeout was then performed. The patient was positioned lying lateral recumbent on examination table. The patient's shoulder was prepped with betadine and multiple alcohol swabs and utilizing ultrasound guidance, the patient's glenohumeral joint was  identified on ultrasound. Using ultrasound guidance a 22-gauge, 3.5 inch needle with a mixture of 2:2:2 cc's lidocaine :bupivicaine:depomedrol was directed from a lateral to medial direction via in-plane technique into the glenohumeral joint with visualization of appropriate spread of injectate into the joint. Patient tolerated the procedure well without immediate complications.        Adal Sereno A. Vita MD [facility]

## 2024-01-16 MED ORDER — BUPIVACAINE HCL 0.25 % IJ SOLN
2.0000 mL | Freq: Once | INTRAMUSCULAR | Status: AC
  Administered 2024-01-16: 2 mL

## 2024-01-16 MED ORDER — METHYLPREDNISOLONE ACETATE 40 MG/ML IJ SUSP
40.0000 mg | Freq: Once | INTRAMUSCULAR | Status: AC
  Administered 2024-01-16: 40 mg via INTRAMUSCULAR

## 2024-01-16 MED ORDER — LIDOCAINE HCL 1 % IJ SOLN
5.0000 mL | Freq: Once | INTRAMUSCULAR | Status: AC
  Administered 2024-01-16: 5 mL via INTRADERMAL

## 2024-01-16 NOTE — Addendum Note (Signed)
 Addended by: LATTIE CARLO BROCKS on: 01/16/2024 04:39 PM   Modules accepted: Orders

## 2024-01-21 MED ORDER — LIDOCAINE HCL 1 % IJ SOLN: 5.0000 mL | Freq: Once | INTRAMUSCULAR | Status: DC

## 2024-01-21 MED ORDER — METHYLPREDNISOLONE ACETATE 40 MG/ML IJ SUSP: 40.0000 mg | Freq: Once | INTRAMUSCULAR | Status: DC

## 2024-01-21 MED ORDER — BUPIVACAINE HCL 0.25 % IJ SOLN: 2.0000 mL | Freq: Once | INTRAMUSCULAR | Status: DC

## 2024-01-21 NOTE — Addendum Note (Signed)
 Addended by: LATTIE CARLO BROCKS on: 01/21/2024 02:46 PM   Modules accepted: Orders

## 2024-01-26 ENCOUNTER — Encounter: Payer: Self-pay | Admitting: Family Medicine

## 2024-01-26 ENCOUNTER — Other Ambulatory Visit: Payer: Self-pay

## 2024-01-26 ENCOUNTER — Ambulatory Visit: Admitting: Family Medicine

## 2024-01-26 VITALS — BP 122/82 | HR 77 | Wt 155.2 lb

## 2024-01-26 DIAGNOSIS — G8929 Other chronic pain: Secondary | ICD-10-CM

## 2024-01-26 DIAGNOSIS — M25511 Pain in right shoulder: Secondary | ICD-10-CM

## 2024-01-26 DIAGNOSIS — M19049 Primary osteoarthritis, unspecified hand: Secondary | ICD-10-CM | POA: Diagnosis not present

## 2024-01-26 DIAGNOSIS — M7521 Bicipital tendinitis, right shoulder: Secondary | ICD-10-CM

## 2024-01-26 MED ORDER — LIDOCAINE HCL 1 % IJ SOLN
5.0000 mL | Freq: Once | INTRAMUSCULAR | Status: AC
  Administered 2024-01-26: 5 mL via INTRADERMAL

## 2024-01-26 MED ORDER — DICLOFENAC SODIUM 75 MG PO TBEC: 75.0000 mg | DELAYED_RELEASE_TABLET | Freq: Two times a day (BID) | ORAL | 0 refills | Status: AC

## 2024-01-26 MED ORDER — BETAMETHASONE SOD PHOS & ACET 6 (3-3) MG/ML IJ SUSP
12.0000 mg | Freq: Once | INTRAMUSCULAR | Status: AC
  Administered 2024-01-26: 12 mg via INTRAMUSCULAR

## 2024-01-26 MED ORDER — BUPIVACAINE HCL 0.25 % IJ SOLN
2.0000 mL | Freq: Once | INTRAMUSCULAR | Status: AC
  Administered 2024-01-26: 2 mL

## 2024-01-26 NOTE — Progress Notes (Signed)
 "  Name: Amber Church   Date of Visit: 01/26/24   Date of last visit with me: 12/01/2023   CHIEF COMPLAINT:  Chief Complaint  Patient presents with   Follow-up    Discuss referral for arthritis.        HPI:  Discussed the use of AI scribe software for clinical note transcription with the patient, who gave verbal consent to proceed.  History of Present Illness   Amber TRULY STANKIEWICZ is a 69 year old female with a history of shoulder pain and CMC arthritis who presents with recurrent shoulder pain.  She has been experiencing recurrent shoulder pain that initially improved after a treatment received before Thanksgiving. The pain was significantly reduced through the holiday season but returned, albeit less intensely, about a month and a half later. The pain is primarily located in the shoulder and arm, with occasional tenderness in the back. It is described as 'sore' and 'tender' but not extremely painful. The pain is exacerbated by certain movements, such as reaching behind or lifting objects, and is described as deep within the shoulder. She recalls a previous MRI ordered for her neck, not the shoulder, and mentions a tear observed in a prior scan. She attributes some of the shoulder strain to repetitive activities, such as caring for her grandchildren. She has previously used meloxicam  for pain relief but is unsure of its effectiveness. She is currently taking diclofenac  for the shoulder pain and the hand pain.  She also reports hand pain, particularly in the thumb area, which she describes as aching and worsening over time. She recalls receiving a shot that provided relief for about a year and a half. The pain is primarily located at the base of the thumb. She notes that the pain worsens with use throughout the day and is better in the morning.  She expresses concern about her family history of arthritis, mentioning her aunt's severe hand deformities. She has experienced falls recently,  which she attributes to footwear issues, and notes a general decline in balance. She has not been able to exercise due to her responsibilities with her grandchildren. No numbness or tingling in the hands, reports aching pain.         OBJECTIVE:       10/06/2023    8:37 AM  Depression screen PHQ 2/9  Decreased Interest 1  Down, Depressed, Hopeless 0  PHQ - 2 Score 1     BP Readings from Last 3 Encounters:  01/26/24 122/82  12/01/23 132/80  11/05/23 116/76    BP 122/82   Pulse 77   Wt 155 lb 3.2 oz (70.4 kg)   SpO2 98%   BMI 26.64 kg/m    Physical Exam   MUSCULOSKELETAL: Pain on resisted external rotation of the shoulder. Tenderness in the cmc joint.      Physical Exam Constitutional:      Appearance: Normal appearance.  Neurological:     General: No focal deficit present.     Mental Status: She is alert and oriented to person, place, and time. Mental status is at baseline.     ASSESSMENT/PLAN:   Assessment & Plan Chronic right shoulder pain  CMC arthritis    Assessment and Plan    Right rotator cuff tear with biceps tendinopathy and chronic right shoulder pain Chronic shoulder pain with rotator cuff tear and biceps tendinopathy. Pain recurred post-treatment, primarily in infraspinatus and biceps tendon. MRI needed to assess current status. Discussed shoulder injections for temporary relief,  not exceeding every three months due to steroid exposure. - Ordered MRI of the right shoulder at [facility]. - Prescribed diclofenac , one in the morning and one at night, with option for night-only use. - Administered biceps tendon injection for temporary pain relief. - Discussed potential AC joint injection if needed.  Bilateral hand CMC osteoarthritis Bilateral hand pain, more pronounced in right hand, consistent with CMC osteoarthritis. Previous steroid injection provided relief for over a year. No signs of rheumatoid arthritis. Discussed steroid injections for  temporary relief and surgery if injections become ineffective. - Ordered x-rays of the hands to assess osteoarthritis extent. - Administered steroid injection to right CMC joint for pain relief. - Ordered blood tests to rule out rheumatoid arthritis.      Procedure: Ultrasound-guided CMC joint injection, left thumb After informed verbal consent was obtained a timeout was performed, patient was seated on exam table. The patient's left hand was placed on stable surface with medial hand on table.  Identification of the [facility] joint was performed in long axis under ultrasound guidance. Utilizing an in-plane approach, a 25-gauge, 5/8 needle was inserted under ultrasound guidance utilizing walk-down technique and visualized with needle entrance into the [facility] joint. The joint was subsequently injected with a 1:1 cc of lidocaine :celestone  combination with physical visitation of injectate spread into the joint space.  Patient tolerated the procedure well without immediate complications.  US -Guided Biceps tendon sheath injection, RIGHT shoulder  After discussion on risks/benefits/indications, informed verbal consent was obtained. A timeout was then performed. Patient was placed in supine position on the table in exam room. The patient's anterior shoulder was prepped with betadine and alcohol swabs. Utilizing ultrasound guidance, the biceps tendon sheath was identified in a long-axis view. Using a 25G, 1.5 needle under ultrasound guidance, the tendon sheath was injected from a superior-inferior direction with a 1:1:1 lidocaine :bupivicaine:depomedrol with visualization of injectate flow within the tendon sheath via an in-plane technique. Patient tolerated the procedure well without immediate complications.     Innocence Schlotzhauer A. Vita MD [facility] "

## 2024-01-28 LAB — RHEUMATOID FACTOR: Rheumatoid fact SerPl-aCnc: <10 [IU]/mL

## 2024-01-28 LAB — ANTI-CCP AB, IGG + IGA (RDL): Anti-CCP Ab, IgG + IgA (RDL): <20 U

## 2024-01-29 ENCOUNTER — Ambulatory Visit: Payer: Self-pay | Admitting: Family Medicine

## 2024-02-03 ENCOUNTER — Ambulatory Visit (HOSPITAL_COMMUNITY)

## 2024-02-07 ENCOUNTER — Ambulatory Visit (HOSPITAL_COMMUNITY)

## 2024-02-14 ENCOUNTER — Ambulatory Visit (HOSPITAL_COMMUNITY): Admission: RE | Admit: 2024-02-14

## 2024-03-25 IMAGING — CT CT ABD-PELV W/ CM
2 of 5 series · 15 of 46 positions shown, 17 images · IV contrast (Omnipaque or Isovue)
Comparison: None Available.

CLINICAL DATA: Abdominal pain. Status post cholecystectomy 2 weeks
ago.

EXAM:
CT ABDOMEN AND PELVIS WITH CONTRAST
TECHNIQUE: Multidetector CT imaging of the abdomen and pelvis was performed
using the standard protocol following bolus administration of
intravenous contrast.

[Series 2: axial st · axial · 0.86mm/px · z∈[+792,+1217]mm · 12 of 97 slices shown, 14 images]
[im 6/97  soft-tissue]
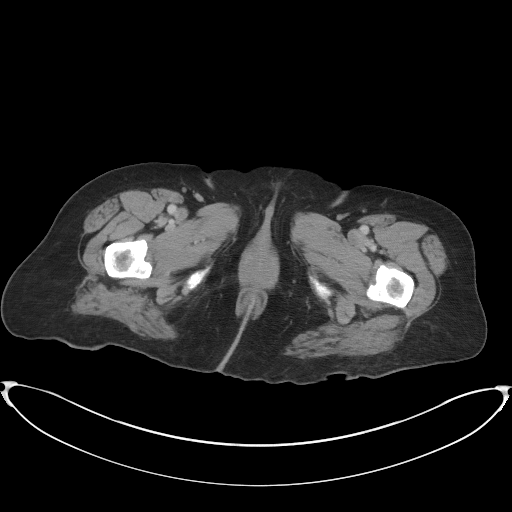
[im 6/97  bone]
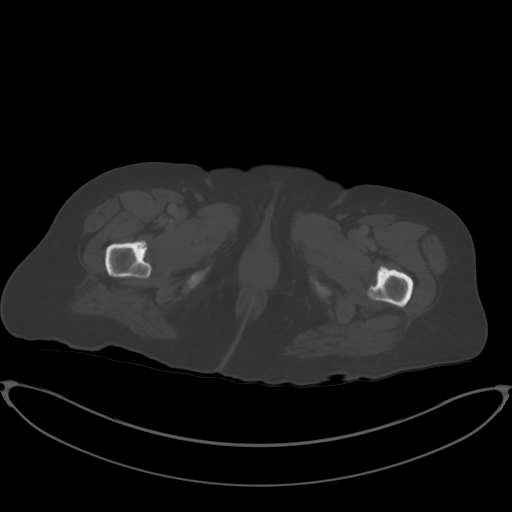
[im 16/97  soft-tissue]
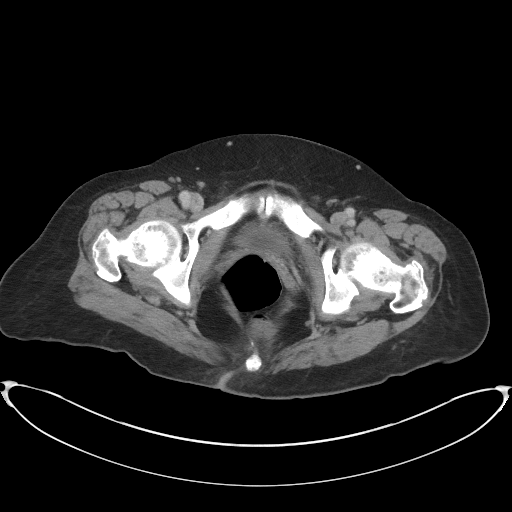
[im 21/97  soft-tissue]
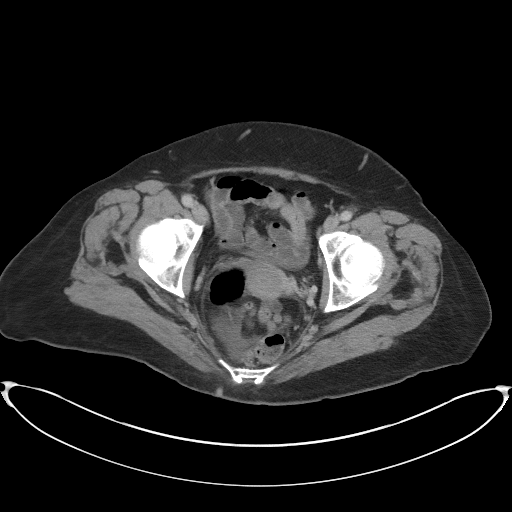
[im 31/97  soft-tissue]
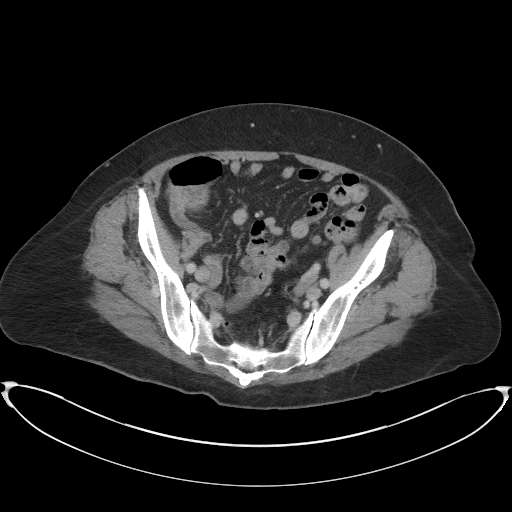
[im 36/97  soft-tissue]
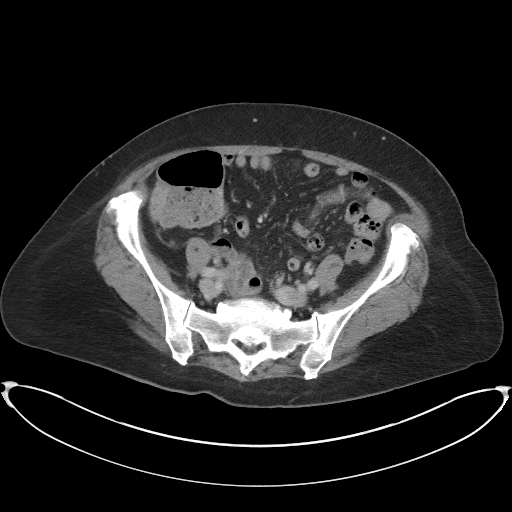
[im 46/97  soft-tissue]
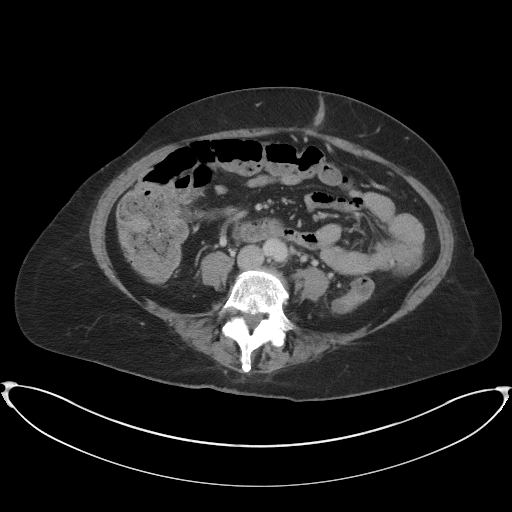
[im 51/97  soft-tissue]
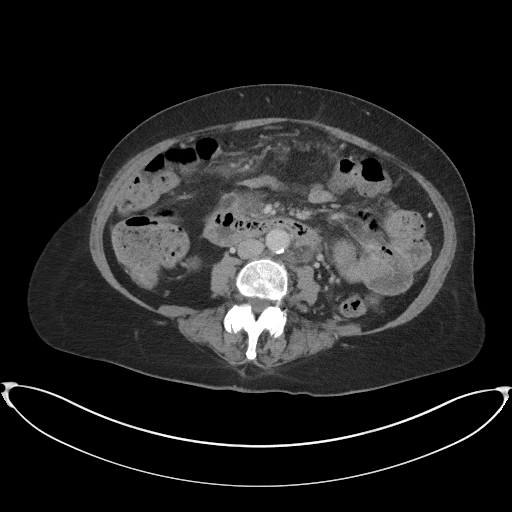
[im 61/97  soft-tissue]
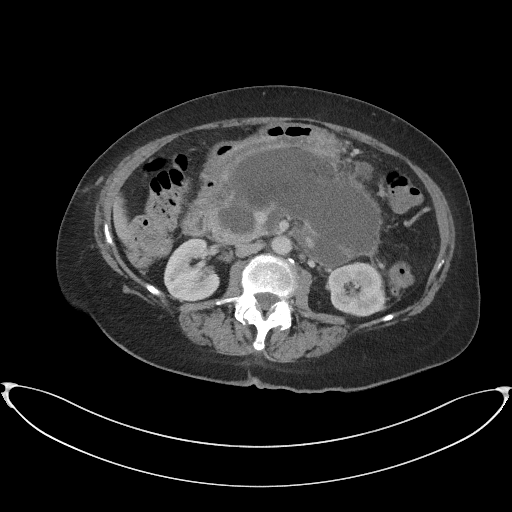
[im 66/97  soft-tissue]
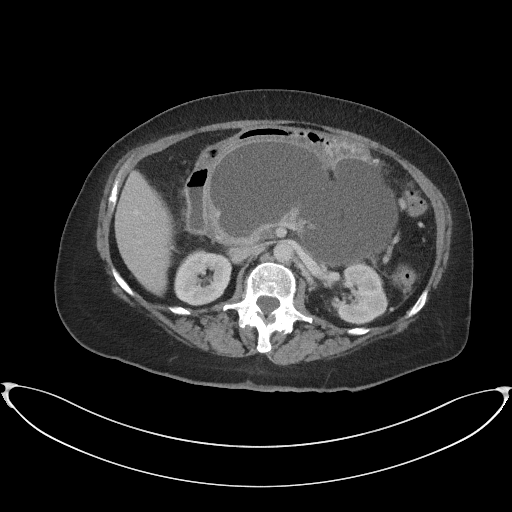
[im 66/97  bone]
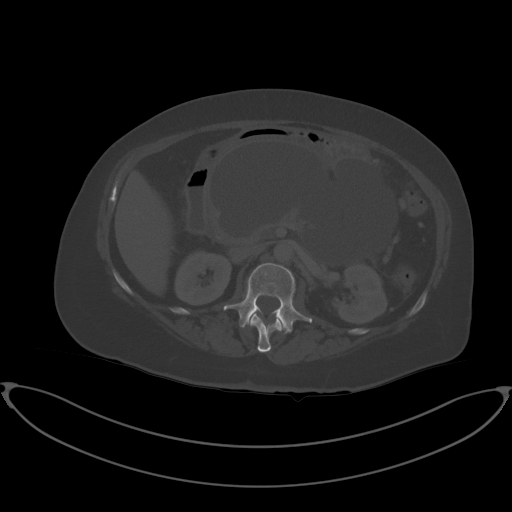
[im 76/97  soft-tissue]
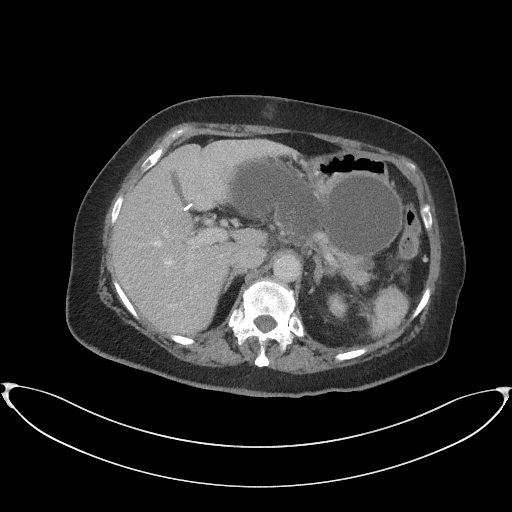
[im 81/97  soft-tissue]
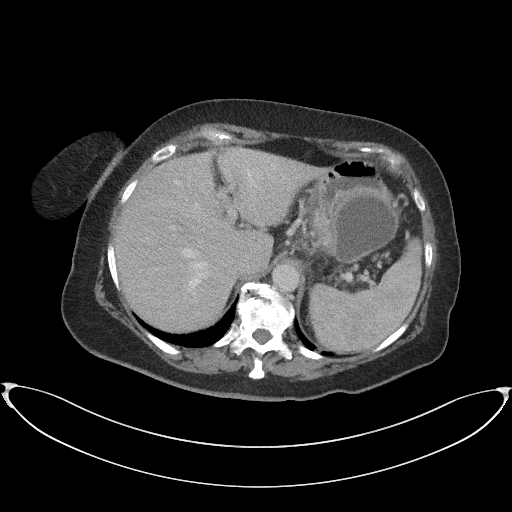
[im 91/97  soft-tissue]
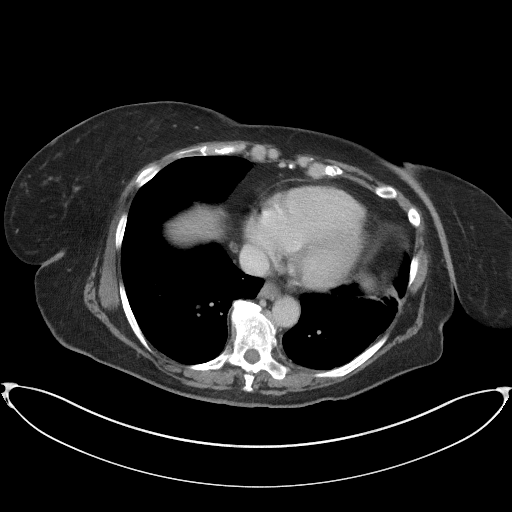

[Series 5: coronal st · coronal · 0.85mm/px · 3 of 115 slices shown]
[im 39/115  soft-tissue]
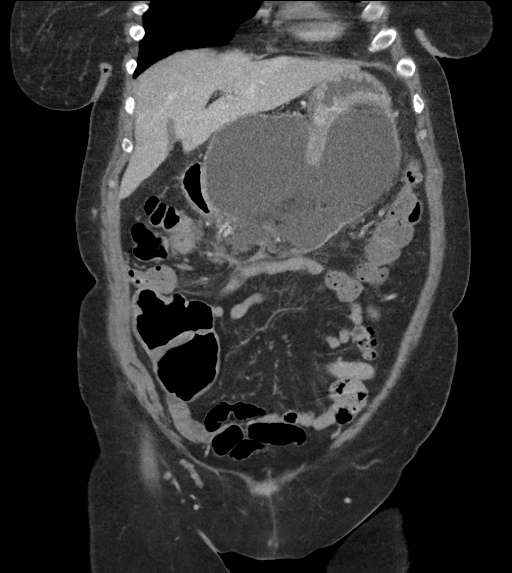
[im 51/115  soft-tissue]
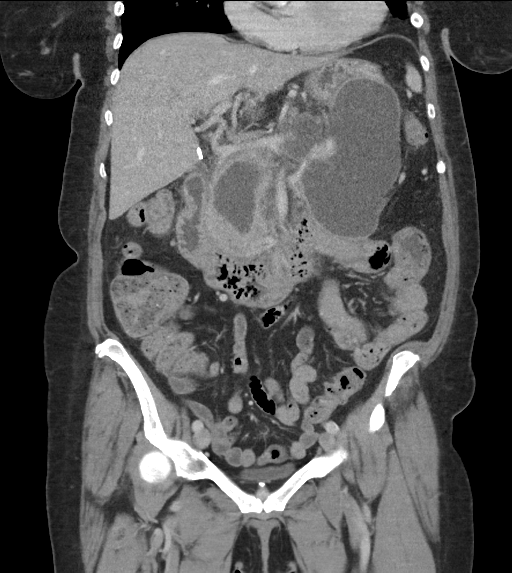
[im 64/115  soft-tissue]
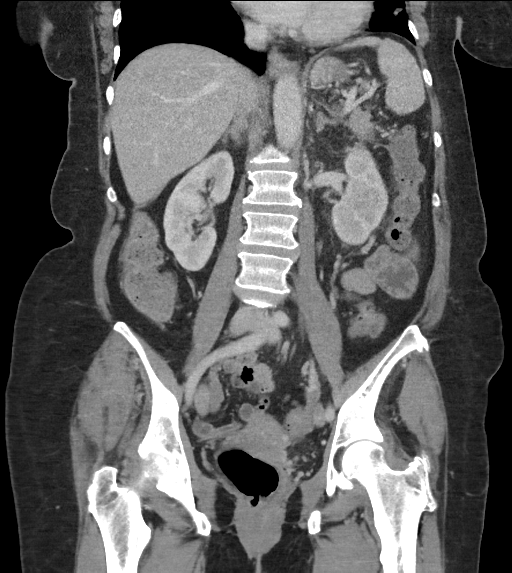

[15 of 46 positions shown; findings below may reference images not displayed]

RADIATION DOSE REDUCTION: This exam was performed according to the
departmental dose-optimization program which includes automated
exposure control, adjustment of the mA and/or kV according to
patient size and/or use of iterative reconstruction technique.

CONTRAST:  100mL OMNIPAQUE IOHEXOL 300 MG/ML  SOLN
FINDINGS: Lower chest: Scar versus subsegmental atelectasis noted within both
lung bases. No pleural effusion.

Hepatobiliary: No suspicious liver abnormality. Signs of recent
cholecystectomy. Trace fluid noted within the gallbladder fossa. No
intrahepatic or common bile duct dilatation.

Pancreas: Signs of subacute pancreatitis identified with large,
mature bilobed pseudocyst originating from the head through tail of
pancreas. The pseudocyst measures 16.1 x 9.1 by 10.7 cm and extends
into the lesser sac with marked mass effect upon the posterior
aspect of the stomach. Additional smaller satellite pseudocysts are
noted at a slightly lower level within the ventral abdomen as noted
on image 44/2.

Spleen: Normal in size without focal abnormality.

Adrenals/Urinary Tract: Normal adrenal glands. No nephrolithiasis,
hydronephrosis or mass. Urinary bladder is unremarkable.

Stomach/Bowel: The gastric lumen is compressed by large pseudo cyst.
The stomach is pressed against the ventral abdominal wall. No
significant small or large bowel wall thickening, inflammation, or
distension. Sigmoid diverticulosis without signs of acute
diverticulitis.

Vascular/Lymphatic: Normal appearance of the abdominal aorta.
Prominent retroperitoneal lymph nodes likely reflect reactive
adenopathy. No signs of abdominopelvic adenopathy. The intrahepatic
portal vein is patent. Pseudo cyst exhibits marked mass-effect upon
the extrahepatic portal vein and portal venous confluence without
signs of occlusion. The splenic vein is not visualized and is
presumed to be a chronically occluded with increased upper abdominal
collateral vessel formation.

Reproductive: Small partially exophytic calcified fibroid noted
arising off the uterine fundus. No signs of adnexal mass.

Other: Small volume of free fluid noted within the posterior pelvis.

Musculoskeletal: No acute or suspicious osseous findings.
Degenerative disc disease noted within the lower thoracic and lower
sub lumbar spine.
IMPRESSION: 1. Signs of subacute pancreatitis with large, mature bilobed
pseudocyst with marked mass effect upon the posterior aspect of the
stomach, extrahepatic portal vein and portal venous confluence.
2. Signs of splenic vein occlusion identified with increased upper
abdominal collateral formation.
3. No signs of bile duct obstruction.
4. Small volume of free fluid within the posterior pelvis.
5. Sigmoid diverticulosis without signs of acute diverticulitis.

## 2024-03-25 IMAGING — DX DG ABDOMEN ACUTE W/ 1V CHEST
3 series · 3 of 3 positions shown · non-contrast
Comparison: None Available.

CLINICAL DATA: Abdominal distention, status post cholecystectomy

EXAM:
DG ABDOMEN ACUTE WITH 1 VIEW CHEST

[abdomen erect ap]
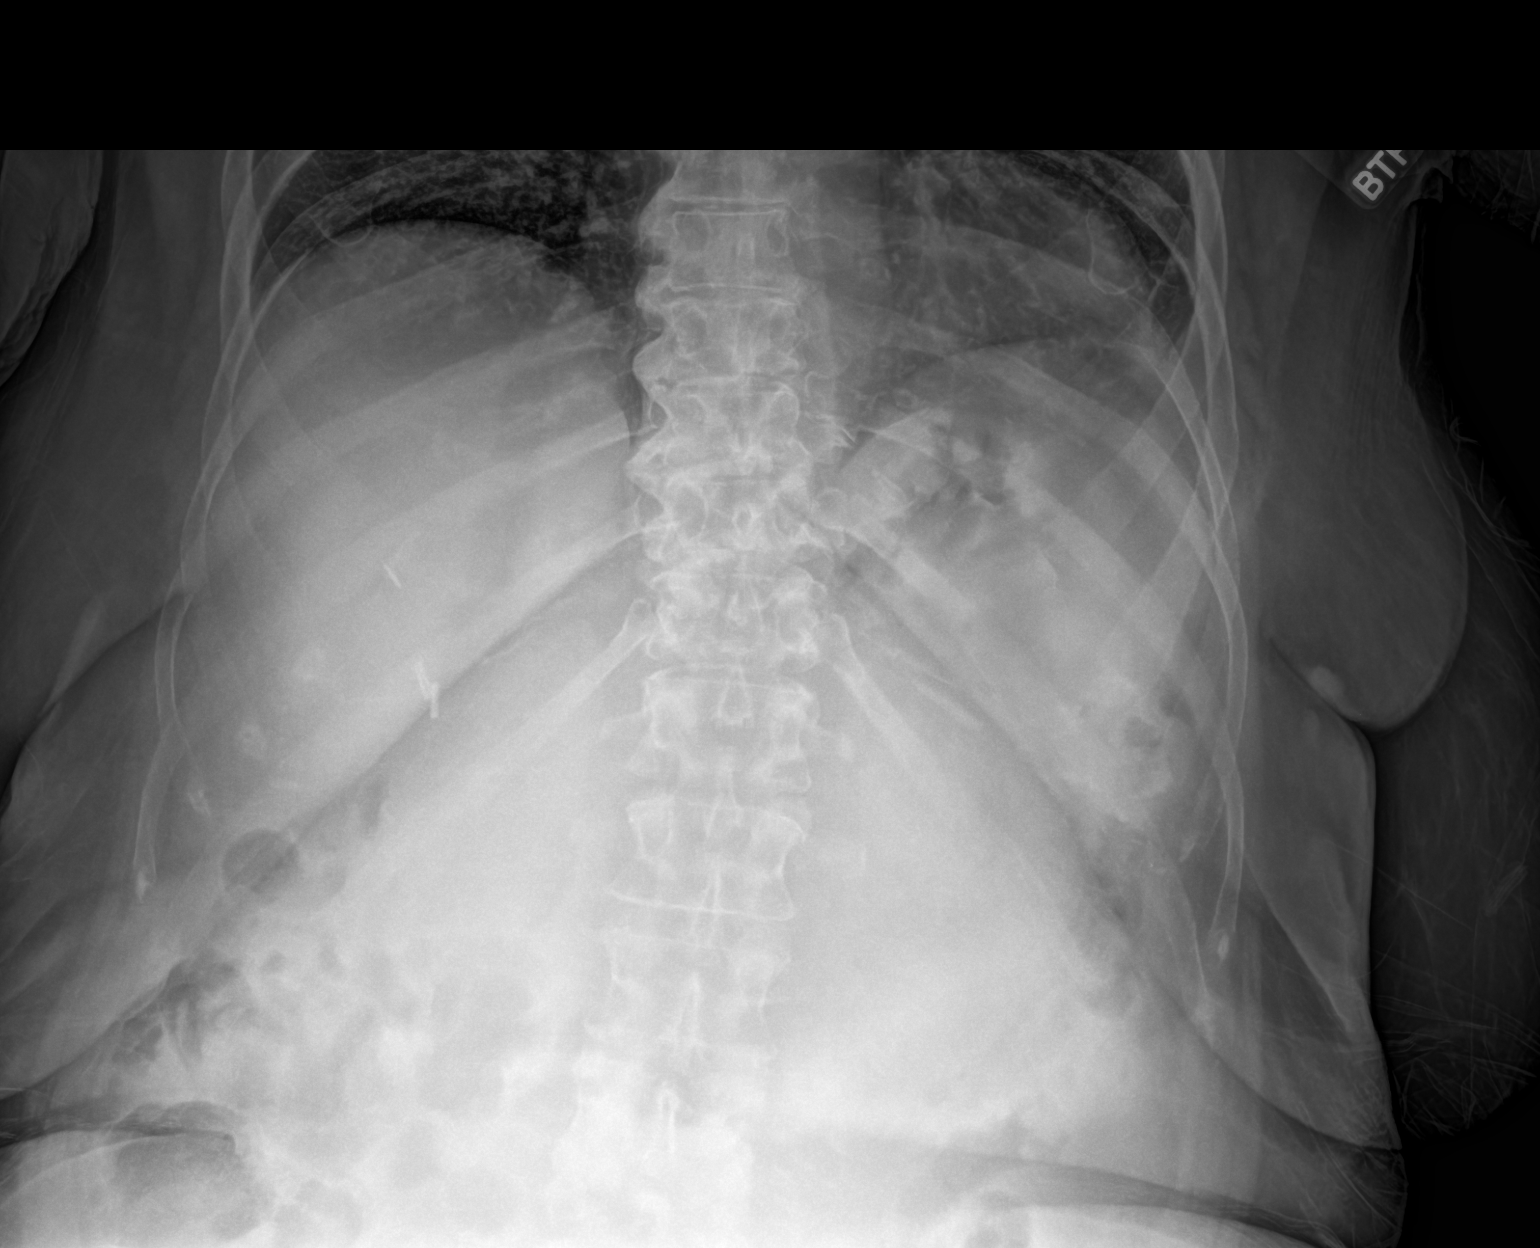

[abdomen supine]
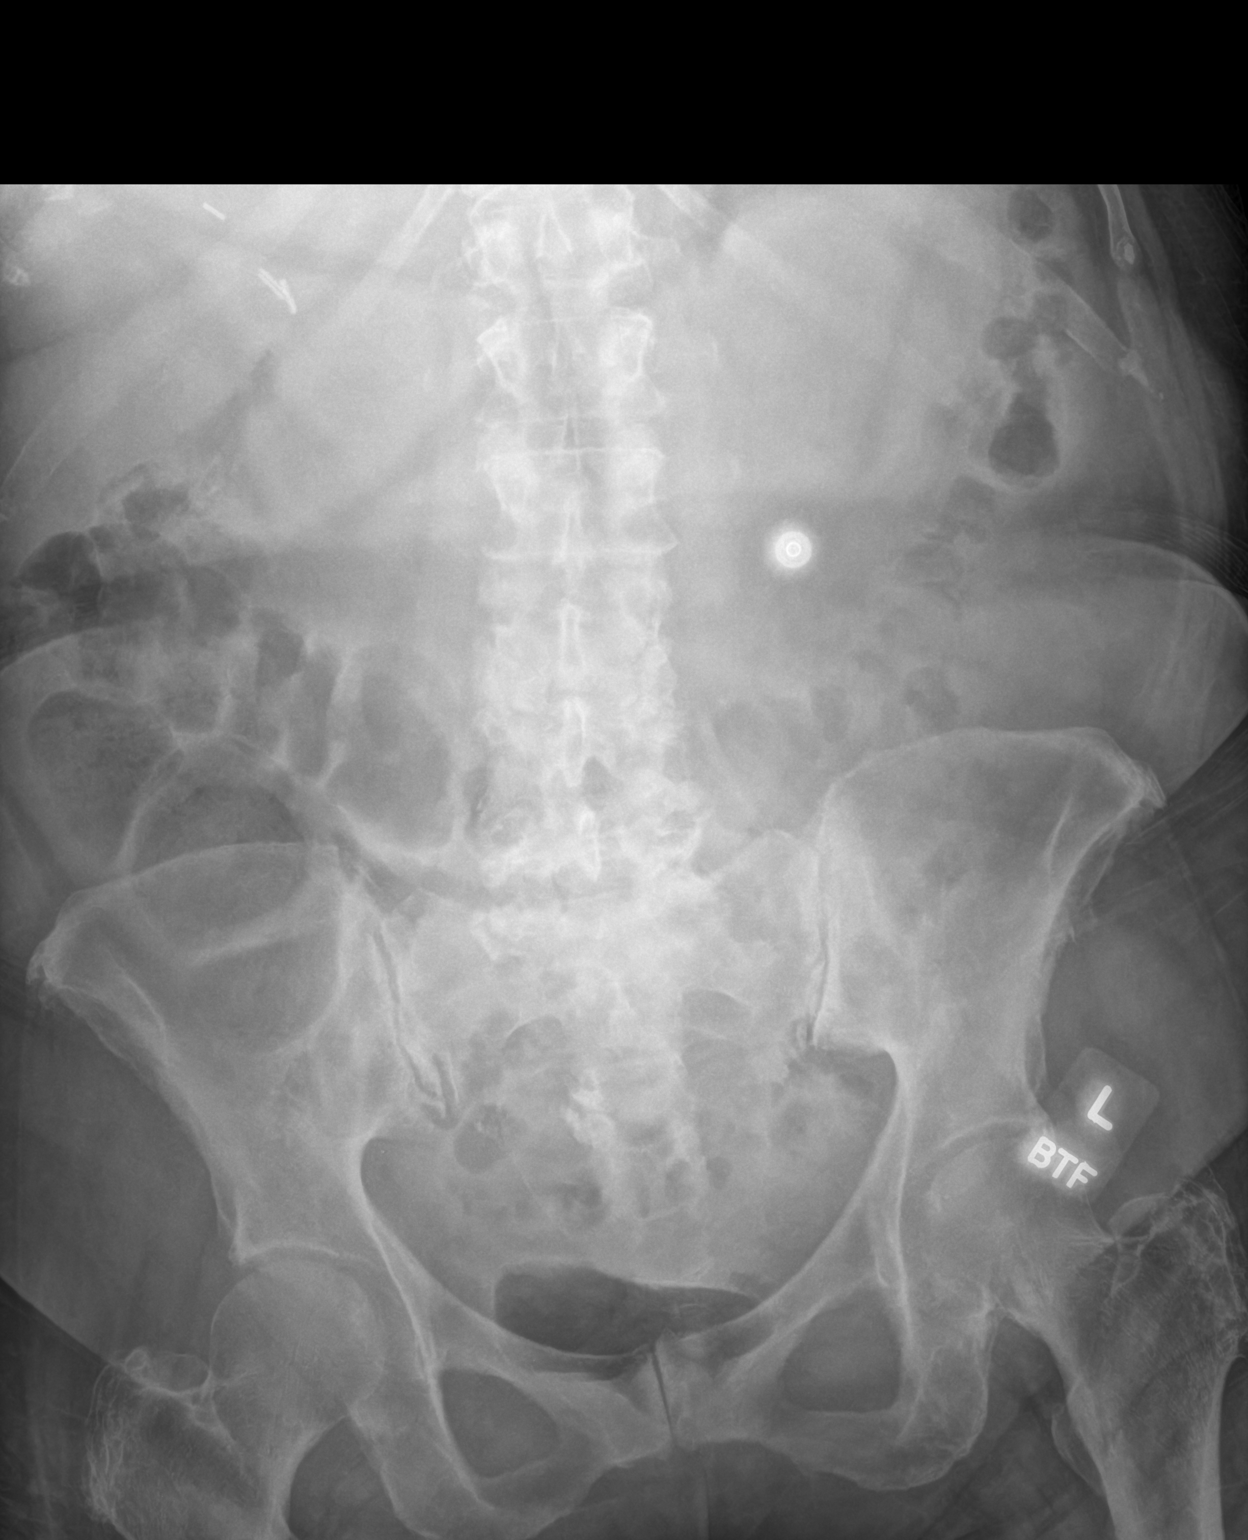

[chest ap]
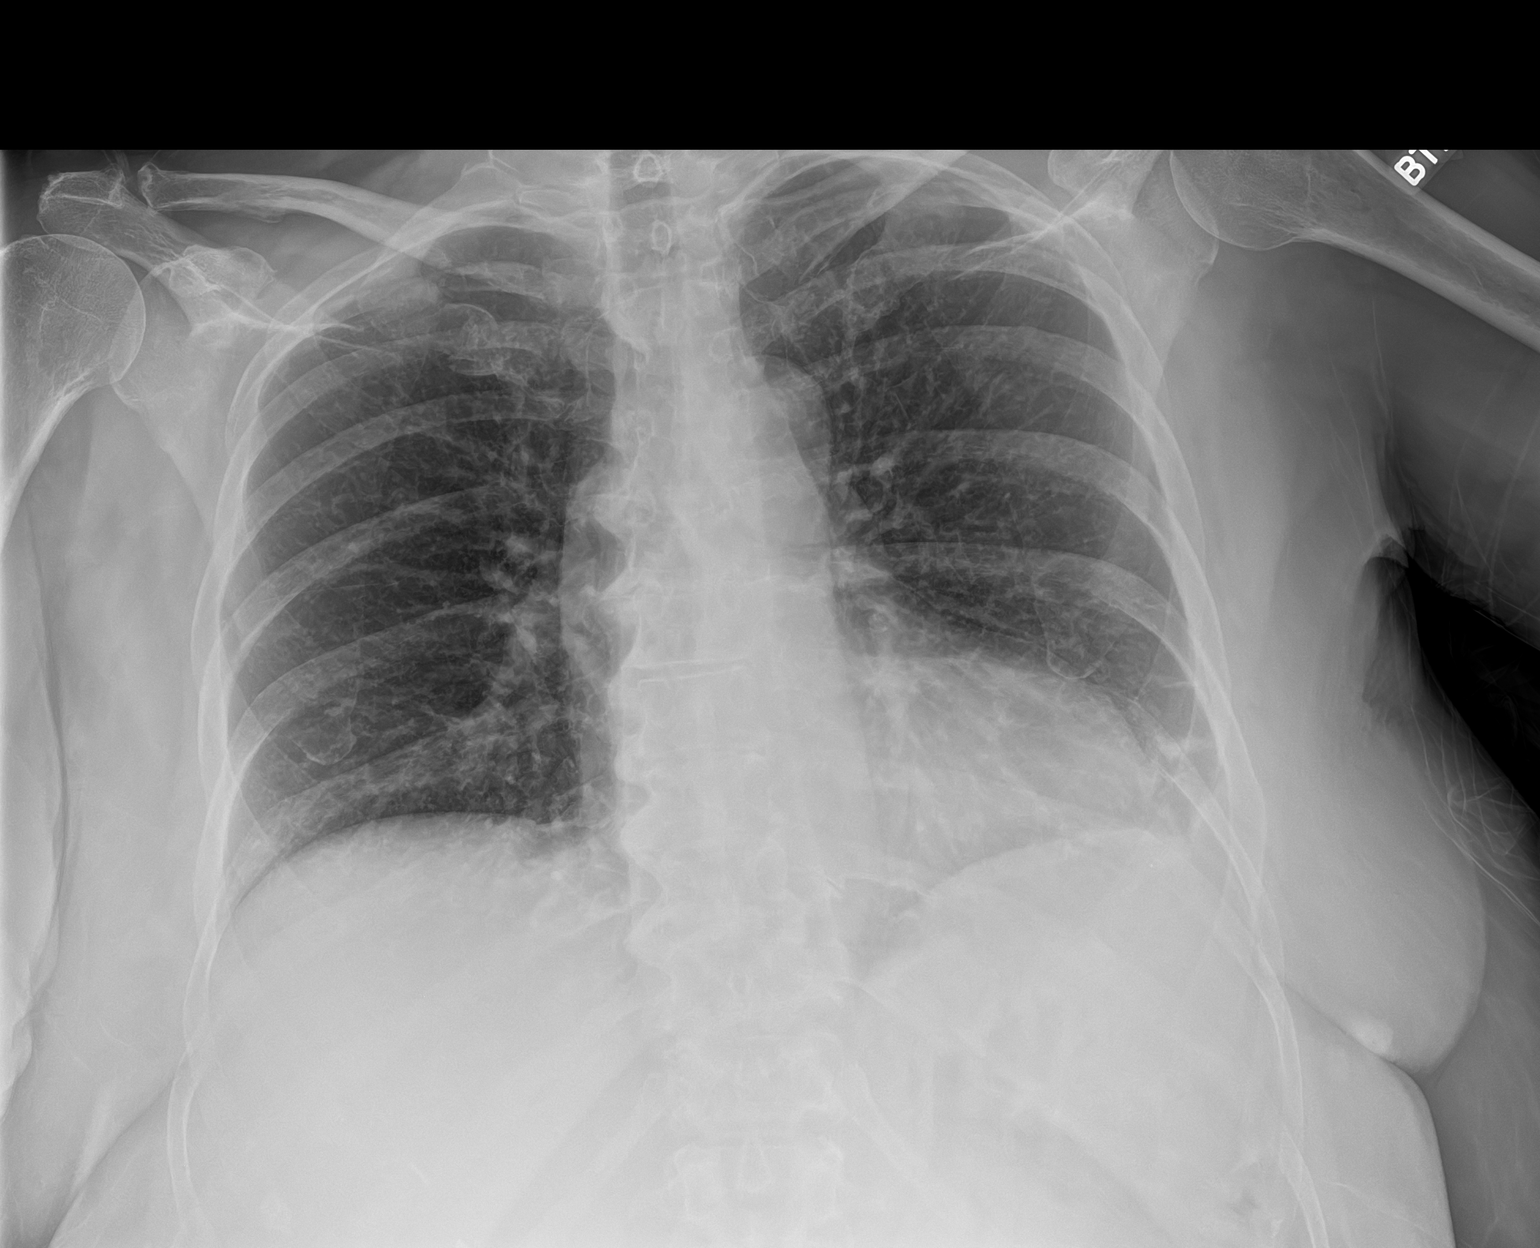

[3 of 3 positions shown; findings below may reference images not displayed]

FINDINGS: Transverse diameter of heart is slightly increased. There are no
signs of pulmonary edema. Small left pleural effusion is seen. There
are small linear densities in the lateral aspect of left lower lung
fields. There is no focal pulmonary consolidation. There is no
pneumothorax. Bowel gas pattern is nonspecific. There is no dilation
of small-bowel loops. Gas and stool are present in colon. Surgical
clips are seen in gallbladder fossa. No abnormal masses or
calcifications are seen. Degenerative changes are noted in the
lumbar spine.
IMPRESSION: Bowel gas pattern is nonspecific.

Small left pleural effusion. There are no signs of pulmonary edema
or focal pulmonary consolidation.

## 2024-04-06 ENCOUNTER — Ambulatory Visit: Payer: Self-pay | Admitting: Family Medicine

## 2024-10-06 ENCOUNTER — Ambulatory Visit: Payer: Self-pay | Admitting: Family Medicine
